# Patient Record
Sex: Female | Born: 1965
Health system: Southern US, Community
[De-identification: ages and names within clinical notes are randomized; demographics above are authoritative.]

## PROBLEM LIST (undated history)

## (undated) DIAGNOSIS — G4730 Sleep apnea, unspecified: Secondary | ICD-10-CM

## (undated) DIAGNOSIS — Z9889 Other specified postprocedural states: Secondary | ICD-10-CM

## (undated) DIAGNOSIS — F4310 Post-traumatic stress disorder, unspecified: Secondary | ICD-10-CM

## (undated) DIAGNOSIS — H8110 Benign paroxysmal vertigo, unspecified ear: Secondary | ICD-10-CM

## (undated) DIAGNOSIS — F1010 Alcohol abuse, uncomplicated: Secondary | ICD-10-CM

## (undated) DIAGNOSIS — E7800 Pure hypercholesterolemia, unspecified: Secondary | ICD-10-CM

## (undated) DIAGNOSIS — E785 Hyperlipidemia, unspecified: Secondary | ICD-10-CM

## (undated) DIAGNOSIS — F1910 Other psychoactive substance abuse, uncomplicated: Secondary | ICD-10-CM

## (undated) DIAGNOSIS — Z8742 Personal history of other diseases of the female genital tract: Secondary | ICD-10-CM

## (undated) DIAGNOSIS — F3181 Bipolar II disorder: Secondary | ICD-10-CM

## (undated) DIAGNOSIS — R112 Nausea with vomiting, unspecified: Secondary | ICD-10-CM

## (undated) DIAGNOSIS — I10 Essential (primary) hypertension: Secondary | ICD-10-CM

## (undated) DIAGNOSIS — R011 Cardiac murmur, unspecified: Secondary | ICD-10-CM

## (undated) DIAGNOSIS — T7840XA Allergy, unspecified, initial encounter: Secondary | ICD-10-CM

## (undated) DIAGNOSIS — F419 Anxiety disorder, unspecified: Secondary | ICD-10-CM

## (undated) HISTORY — DX: Sleep apnea, unspecified: G47.30

## (undated) HISTORY — DX: Personal history of other diseases of the female genital tract: Z87.42

## (undated) HISTORY — DX: Essential (primary) hypertension: I10

## (undated) HISTORY — DX: Other specified postprocedural states: Z98.890

## (undated) HISTORY — DX: Anxiety disorder, unspecified: F41.9

## (undated) HISTORY — DX: Nausea with vomiting, unspecified: R11.2

## (undated) HISTORY — DX: Other psychoactive substance abuse, uncomplicated: F19.10

## (undated) HISTORY — DX: Allergy, unspecified, initial encounter: T78.40XA

## (undated) HISTORY — DX: Cardiac murmur, unspecified: R01.1

## (undated) HISTORY — DX: Post-traumatic stress disorder, unspecified: F43.10

## (undated) HISTORY — DX: Bipolar II disorder: F31.81

## (undated) HISTORY — DX: Benign paroxysmal vertigo, unspecified ear: H81.10

## (undated) HISTORY — DX: Hyperlipidemia, unspecified: E78.5

## (undated) HISTORY — DX: Alcohol abuse, uncomplicated: F10.10

## (undated) HISTORY — DX: Pure hypercholesterolemia, unspecified: E78.00

---

## 1998-10-09 ENCOUNTER — Emergency Department (HOSPITAL_COMMUNITY): Admission: EM | Admit: 1998-10-09 | Discharge: 1998-10-09 | Payer: Self-pay | Admitting: Emergency Medicine

## 1999-03-25 ENCOUNTER — Other Ambulatory Visit: Admission: RE | Admit: 1999-03-25 | Discharge: 1999-03-25 | Payer: Self-pay | Admitting: Internal Medicine

## 1999-09-13 ENCOUNTER — Encounter: Admission: RE | Admit: 1999-09-13 | Discharge: 1999-09-13 | Payer: Self-pay | Admitting: Sports Medicine

## 1999-11-29 ENCOUNTER — Encounter: Admission: RE | Admit: 1999-11-29 | Discharge: 1999-11-29 | Payer: Self-pay | Admitting: Family Medicine

## 2000-01-12 ENCOUNTER — Encounter: Admission: RE | Admit: 2000-01-12 | Discharge: 2000-01-12 | Payer: Self-pay | Admitting: Family Medicine

## 2000-01-12 ENCOUNTER — Encounter: Admission: RE | Admit: 2000-01-12 | Discharge: 2000-01-12 | Payer: Self-pay | Admitting: Sports Medicine

## 2000-01-12 ENCOUNTER — Encounter: Payer: Self-pay | Admitting: Sports Medicine

## 2000-06-13 ENCOUNTER — Other Ambulatory Visit: Admission: RE | Admit: 2000-06-13 | Discharge: 2000-06-13 | Payer: Self-pay | Admitting: Internal Medicine

## 2000-06-16 ENCOUNTER — Encounter: Admission: RE | Admit: 2000-06-16 | Discharge: 2000-06-16 | Payer: Self-pay | Admitting: Sports Medicine

## 2000-07-03 ENCOUNTER — Encounter: Admission: RE | Admit: 2000-07-03 | Discharge: 2000-07-03 | Payer: Self-pay | Admitting: Sports Medicine

## 2000-09-13 ENCOUNTER — Encounter: Admission: RE | Admit: 2000-09-13 | Discharge: 2000-09-13 | Payer: Self-pay | Admitting: Sports Medicine

## 2000-10-02 ENCOUNTER — Encounter: Admission: RE | Admit: 2000-10-02 | Discharge: 2000-10-02 | Payer: Self-pay | Admitting: Sports Medicine

## 2000-12-25 ENCOUNTER — Encounter: Admission: RE | Admit: 2000-12-25 | Discharge: 2000-12-25 | Payer: Self-pay | Admitting: Sports Medicine

## 2001-01-24 ENCOUNTER — Encounter: Admission: RE | Admit: 2001-01-24 | Discharge: 2001-01-24 | Payer: Self-pay | Admitting: Family Medicine

## 2001-03-28 ENCOUNTER — Encounter: Admission: RE | Admit: 2001-03-28 | Discharge: 2001-03-28 | Payer: Self-pay | Admitting: Family Medicine

## 2001-04-13 ENCOUNTER — Encounter: Admission: RE | Admit: 2001-04-13 | Discharge: 2001-04-13 | Payer: Self-pay | Admitting: Sports Medicine

## 2001-05-15 ENCOUNTER — Encounter: Admission: RE | Admit: 2001-05-15 | Discharge: 2001-05-15 | Payer: Self-pay | Admitting: Family Medicine

## 2001-10-09 ENCOUNTER — Encounter: Admission: RE | Admit: 2001-10-09 | Discharge: 2001-10-09 | Payer: Self-pay | Admitting: Family Medicine

## 2001-10-24 ENCOUNTER — Encounter: Admission: RE | Admit: 2001-10-24 | Discharge: 2001-10-24 | Payer: Self-pay | Admitting: Family Medicine

## 2001-11-15 ENCOUNTER — Encounter: Admission: RE | Admit: 2001-11-15 | Discharge: 2001-11-15 | Payer: Self-pay | Admitting: Family Medicine

## 2001-12-05 ENCOUNTER — Encounter: Admission: RE | Admit: 2001-12-05 | Discharge: 2001-12-05 | Payer: Self-pay | Admitting: Family Medicine

## 2002-01-02 ENCOUNTER — Encounter: Admission: RE | Admit: 2002-01-02 | Discharge: 2002-01-02 | Payer: Self-pay | Admitting: Family Medicine

## 2002-01-30 ENCOUNTER — Encounter: Admission: RE | Admit: 2002-01-30 | Discharge: 2002-01-30 | Payer: Self-pay | Admitting: Family Medicine

## 2002-01-31 ENCOUNTER — Ambulatory Visit (HOSPITAL_COMMUNITY): Admission: RE | Admit: 2002-01-31 | Discharge: 2002-01-31 | Payer: Self-pay | Admitting: Family Medicine

## 2002-03-06 ENCOUNTER — Encounter: Admission: RE | Admit: 2002-03-06 | Discharge: 2002-03-06 | Payer: Self-pay | Admitting: Family Medicine

## 2002-03-27 ENCOUNTER — Encounter: Admission: RE | Admit: 2002-03-27 | Discharge: 2002-03-27 | Payer: Self-pay | Admitting: Family Medicine

## 2002-03-28 ENCOUNTER — Encounter: Admission: RE | Admit: 2002-03-28 | Discharge: 2002-03-28 | Payer: Self-pay | Admitting: Family Medicine

## 2002-04-24 ENCOUNTER — Encounter: Admission: RE | Admit: 2002-04-24 | Discharge: 2002-04-24 | Payer: Self-pay | Admitting: Family Medicine

## 2002-05-02 ENCOUNTER — Ambulatory Visit (HOSPITAL_COMMUNITY): Admission: RE | Admit: 2002-05-02 | Discharge: 2002-05-02 | Payer: Self-pay | Admitting: Family Medicine

## 2002-05-08 ENCOUNTER — Encounter: Admission: RE | Admit: 2002-05-08 | Discharge: 2002-05-08 | Payer: Self-pay | Admitting: Family Medicine

## 2002-05-22 ENCOUNTER — Encounter: Admission: RE | Admit: 2002-05-22 | Discharge: 2002-05-22 | Payer: Self-pay | Admitting: Family Medicine

## 2002-05-31 ENCOUNTER — Encounter: Admission: RE | Admit: 2002-05-31 | Discharge: 2002-05-31 | Payer: Self-pay | Admitting: Family Medicine

## 2002-06-01 ENCOUNTER — Encounter (INDEPENDENT_AMBULATORY_CARE_PROVIDER_SITE_OTHER): Payer: Self-pay

## 2002-06-01 ENCOUNTER — Encounter: Payer: Self-pay | Admitting: *Deleted

## 2002-06-01 ENCOUNTER — Inpatient Hospital Stay (HOSPITAL_COMMUNITY): Admission: AD | Admit: 2002-06-01 | Discharge: 2002-06-09 | Payer: Self-pay | Admitting: *Deleted

## 2002-06-20 ENCOUNTER — Encounter: Admission: RE | Admit: 2002-06-20 | Discharge: 2002-06-20 | Payer: Self-pay | Admitting: Family Medicine

## 2002-07-11 ENCOUNTER — Encounter: Admission: RE | Admit: 2002-07-11 | Discharge: 2002-08-10 | Payer: Self-pay | Admitting: Family Medicine

## 2002-07-17 ENCOUNTER — Encounter: Admission: RE | Admit: 2002-07-17 | Discharge: 2002-07-17 | Payer: Self-pay | Admitting: Family Medicine

## 2002-08-09 ENCOUNTER — Encounter: Admission: RE | Admit: 2002-08-09 | Discharge: 2002-08-09 | Payer: Self-pay | Admitting: Sports Medicine

## 2002-10-09 ENCOUNTER — Encounter: Admission: RE | Admit: 2002-10-09 | Discharge: 2002-10-09 | Payer: Self-pay | Admitting: Family Medicine

## 2002-11-04 ENCOUNTER — Encounter: Admission: RE | Admit: 2002-11-04 | Discharge: 2002-11-04 | Payer: Self-pay | Admitting: Family Medicine

## 2002-11-07 ENCOUNTER — Emergency Department (HOSPITAL_COMMUNITY): Admission: EM | Admit: 2002-11-07 | Discharge: 2002-11-07 | Payer: Self-pay | Admitting: Emergency Medicine

## 2003-02-19 ENCOUNTER — Encounter: Admission: RE | Admit: 2003-02-19 | Discharge: 2003-02-19 | Payer: Self-pay | Admitting: Family Medicine

## 2003-02-26 ENCOUNTER — Encounter: Admission: RE | Admit: 2003-02-26 | Discharge: 2003-02-26 | Payer: Self-pay | Admitting: Sports Medicine

## 2003-02-26 ENCOUNTER — Inpatient Hospital Stay (HOSPITAL_COMMUNITY): Admission: EM | Admit: 2003-02-26 | Discharge: 2003-03-03 | Payer: Self-pay | Admitting: Psychiatry

## 2003-04-02 ENCOUNTER — Encounter: Admission: RE | Admit: 2003-04-02 | Discharge: 2003-04-02 | Payer: Self-pay | Admitting: Family Medicine

## 2003-06-30 ENCOUNTER — Encounter: Admission: RE | Admit: 2003-06-30 | Discharge: 2003-06-30 | Payer: Self-pay | Admitting: Family Medicine

## 2003-08-19 ENCOUNTER — Encounter: Admission: RE | Admit: 2003-08-19 | Discharge: 2003-08-19 | Payer: Self-pay | Admitting: Family Medicine

## 2003-08-29 ENCOUNTER — Encounter: Admission: RE | Admit: 2003-08-29 | Discharge: 2003-08-29 | Payer: Self-pay | Admitting: Sports Medicine

## 2003-09-16 ENCOUNTER — Encounter: Admission: RE | Admit: 2003-09-16 | Discharge: 2003-09-16 | Payer: Self-pay | Admitting: Family Medicine

## 2003-10-01 ENCOUNTER — Encounter: Admission: RE | Admit: 2003-10-01 | Discharge: 2003-10-01 | Payer: Self-pay | Admitting: Family Medicine

## 2003-12-10 ENCOUNTER — Encounter: Admission: RE | Admit: 2003-12-10 | Discharge: 2003-12-10 | Payer: Self-pay | Admitting: Family Medicine

## 2004-02-18 ENCOUNTER — Encounter: Admission: RE | Admit: 2004-02-18 | Discharge: 2004-02-18 | Payer: Self-pay | Admitting: Family Medicine

## 2004-03-18 ENCOUNTER — Encounter: Admission: RE | Admit: 2004-03-18 | Discharge: 2004-03-18 | Payer: Self-pay | Admitting: Family Medicine

## 2004-03-29 ENCOUNTER — Encounter: Admission: RE | Admit: 2004-03-29 | Discharge: 2004-03-29 | Payer: Self-pay | Admitting: Family Medicine

## 2004-07-09 ENCOUNTER — Encounter: Admission: RE | Admit: 2004-07-09 | Discharge: 2004-07-09 | Payer: Self-pay | Admitting: Family Medicine

## 2004-07-15 ENCOUNTER — Ambulatory Visit: Payer: Self-pay | Admitting: Sports Medicine

## 2004-08-04 ENCOUNTER — Ambulatory Visit: Payer: Self-pay | Admitting: Family Medicine

## 2004-08-05 ENCOUNTER — Ambulatory Visit: Payer: Self-pay | Admitting: Family Medicine

## 2004-08-26 ENCOUNTER — Ambulatory Visit: Payer: Self-pay | Admitting: Family Medicine

## 2004-08-29 ENCOUNTER — Emergency Department (HOSPITAL_COMMUNITY): Admission: EM | Admit: 2004-08-29 | Discharge: 2004-08-29 | Payer: Self-pay | Admitting: Internal Medicine

## 2004-10-12 ENCOUNTER — Ambulatory Visit: Payer: Self-pay | Admitting: Sports Medicine

## 2004-11-05 ENCOUNTER — Ambulatory Visit: Payer: Self-pay | Admitting: Sports Medicine

## 2004-12-20 ENCOUNTER — Ambulatory Visit: Payer: Self-pay | Admitting: Family Medicine

## 2005-01-10 ENCOUNTER — Ambulatory Visit: Payer: Self-pay

## 2005-01-17 ENCOUNTER — Ambulatory Visit: Payer: Self-pay | Admitting: Family Medicine

## 2005-01-31 ENCOUNTER — Ambulatory Visit: Payer: Self-pay | Admitting: Family Medicine

## 2005-03-30 ENCOUNTER — Ambulatory Visit: Payer: Self-pay | Admitting: Family Medicine

## 2005-03-31 ENCOUNTER — Encounter: Admission: RE | Admit: 2005-03-31 | Discharge: 2005-03-31 | Payer: Self-pay | Admitting: Family Medicine

## 2005-08-14 ENCOUNTER — Encounter (INDEPENDENT_AMBULATORY_CARE_PROVIDER_SITE_OTHER): Payer: Self-pay | Admitting: *Deleted

## 2005-08-24 ENCOUNTER — Ambulatory Visit: Payer: Self-pay | Admitting: Family Medicine

## 2005-08-24 ENCOUNTER — Encounter (INDEPENDENT_AMBULATORY_CARE_PROVIDER_SITE_OTHER): Payer: Self-pay | Admitting: Family Medicine

## 2005-09-28 ENCOUNTER — Ambulatory Visit: Payer: Self-pay | Admitting: Family Medicine

## 2006-01-06 ENCOUNTER — Ambulatory Visit: Payer: Self-pay | Admitting: Sports Medicine

## 2006-01-26 ENCOUNTER — Ambulatory Visit: Payer: Self-pay | Admitting: Family Medicine

## 2006-02-09 ENCOUNTER — Ambulatory Visit: Payer: Self-pay | Admitting: Family Medicine

## 2006-04-24 ENCOUNTER — Ambulatory Visit: Payer: Self-pay | Admitting: Family Medicine

## 2006-04-27 ENCOUNTER — Ambulatory Visit: Payer: Self-pay | Admitting: Family Medicine

## 2006-05-26 ENCOUNTER — Ambulatory Visit: Payer: Self-pay | Admitting: Family Medicine

## 2006-10-09 ENCOUNTER — Ambulatory Visit: Payer: Self-pay | Admitting: Family Medicine

## 2007-01-10 ENCOUNTER — Ambulatory Visit: Payer: Self-pay | Admitting: Family Medicine

## 2007-01-12 ENCOUNTER — Encounter (INDEPENDENT_AMBULATORY_CARE_PROVIDER_SITE_OTHER): Payer: Self-pay | Admitting: *Deleted

## 2007-11-30 ENCOUNTER — Encounter: Admission: RE | Admit: 2007-11-30 | Discharge: 2007-11-30 | Payer: Self-pay | Admitting: Otolaryngology

## 2008-02-04 ENCOUNTER — Encounter: Admission: RE | Admit: 2008-02-04 | Discharge: 2008-02-04 | Payer: Self-pay | Admitting: Family Medicine

## 2008-02-29 ENCOUNTER — Encounter: Admission: RE | Admit: 2008-02-29 | Discharge: 2008-02-29 | Payer: Self-pay | Admitting: Family Medicine

## 2008-07-21 ENCOUNTER — Emergency Department (HOSPITAL_COMMUNITY): Admission: EM | Admit: 2008-07-21 | Discharge: 2008-07-21 | Payer: Self-pay | Admitting: Emergency Medicine

## 2008-12-10 ENCOUNTER — Ambulatory Visit (HOSPITAL_COMMUNITY): Admission: EM | Admit: 2008-12-10 | Discharge: 2008-12-11 | Payer: Self-pay | Admitting: Family Medicine

## 2008-12-10 ENCOUNTER — Encounter (INDEPENDENT_AMBULATORY_CARE_PROVIDER_SITE_OTHER): Payer: Self-pay | Admitting: General Surgery

## 2010-11-14 HISTORY — PX: APPENDECTOMY: SHX54

## 2010-12-05 ENCOUNTER — Encounter: Payer: Self-pay | Admitting: Family Medicine

## 2011-02-28 LAB — URINALYSIS, ROUTINE W REFLEX MICROSCOPIC
Bilirubin Urine: NEGATIVE
Glucose, UA: NEGATIVE mg/dL
Hgb urine dipstick: NEGATIVE
Ketones, ur: NEGATIVE mg/dL
Nitrite: NEGATIVE
Protein, ur: NEGATIVE mg/dL
Specific Gravity, Urine: 1.026 (ref 1.005–1.030)
Urobilinogen, UA: 0.2 mg/dL (ref 0.0–1.0)
pH: 7 (ref 5.0–8.0)

## 2011-02-28 LAB — CBC
HCT: 41.1 % (ref 36.0–46.0)
Hemoglobin: 13.9 g/dL (ref 12.0–15.0)
MCHC: 33.7 g/dL (ref 30.0–36.0)
MCV: 90.7 fL (ref 78.0–100.0)
Platelets: 210 10*3/uL (ref 150–400)
RBC: 4.53 MIL/uL (ref 3.87–5.11)
RDW: 13.5 % (ref 11.5–15.5)
WBC: 18.1 10*3/uL — ABNORMAL HIGH (ref 4.0–10.5)

## 2011-02-28 LAB — BASIC METABOLIC PANEL WITH GFR
Calcium: 9 mg/dL (ref 8.4–10.5)
GFR calc Af Amer: >60 mL/min (ref >60)
GFR calc non Af Amer: >60 mL/min (ref >60)
Sodium: 132 meq/L — ABNORMAL LOW (ref 135–145)

## 2011-02-28 LAB — BASIC METABOLIC PANEL
BUN: 6 mg/dL (ref 6–23)
CO2: 24 mEq/L (ref 19–32)
Chloride: 99 mEq/L (ref 96–112)
Creatinine, Ser: 0.66 mg/dL (ref 0.4–1.2)
Glucose, Bld: 106 mg/dL — ABNORMAL HIGH (ref 70–99)
Potassium: 3.6 mEq/L (ref 3.5–5.1)

## 2011-02-28 LAB — DIFFERENTIAL
Basophils Absolute: 0 10*3/uL (ref 0.0–0.1)
Basophils Relative: 0 % (ref 0–1)
Eosinophils Absolute: 0.1 10*3/uL (ref 0.0–0.7)
Eosinophils Relative: 0 % (ref 0–5)
Lymphocytes Relative: 6 % — ABNORMAL LOW (ref 12–46)
Lymphs Abs: 1.1 K/uL (ref 0.7–4.0)
Monocytes Absolute: 1.3 K/uL — ABNORMAL HIGH (ref 0.1–1.0)
Monocytes Relative: 7 % (ref 3–12)
Neutro Abs: 15.7 K/uL — ABNORMAL HIGH (ref 1.7–7.7)
Neutrophils Relative %: 87 % — ABNORMAL HIGH (ref 43–77)

## 2011-02-28 LAB — SAMPLE TO BLOOD BANK

## 2011-02-28 LAB — PREGNANCY, URINE: Preg Test, Ur: NEGATIVE

## 2011-03-29 NOTE — Op Note (Signed)
Nancy Bradshaw, Nancy Bradshaw             ACCOUNT NO.:  [id number]   MEDICAL RECORD NO.:  [id number]          PATIENT TYPE:  INP   LOCATION:  1528                         FACILITY:  [facility]   PHYSICIAN:  Sharlet Salina T. Hoxworth, M.D.DATE OF BIRTH:  [birth year]   DATE OF PROCEDURE:  12/11/2008  DATE OF DISCHARGE:                               OPERATIVE REPORT   PREOPERATIVE DIAGNOSIS:  Acute appendicitis.   POSTOPERATIVE DIAGNOSIS:  Acute appendicitis.   SURGICAL PROCEDURE:  Laparoscopic appendectomy.   SURGEON:  Lorne Skeens. Hoxworth, M.D.   ANESTHESIA:  General.   BRIEF HISTORY:  Nancy Bradshaw is a 45 year old female who presents  with 18 hours of severe right lower quadrant abdominal pain.  CT scan  has confirmed acute appendicitis.  I have recommended proceeding with  laparoscopic appendectomy.  The nature of the procedure, its  indications, risks of bleeding, infection, anesthetic risks, possible  need for open procedure were discussed and understood.  She is now  brought to the operating room for this procedure.   DESCRIPTION OF OPERATION:  The patient was brought to the operating room  and placed in supine position on the operating table and general  orotracheal anesthesia was induced.  The abdomen was widely sterilely  prepped and draped.  Foley catheter was in place.  She received  preoperative IV antibiotics.  Correct patient and procedure were  verified.  Local anesthesia was used to infiltrate the trocar sites.  A  1 cm incision was made at the umbilicus and dissection carried down to  midline fascia which was sharply incised for 1 cm and the peritoneum  entered under direct vision.  Through a mattress suture of 0 Vicryl, the  Hasson trocar was placed and pneumoperitoneum established.  Under direct  vision a 5 mm trocar was placed in the right upper quadrant and a 12 mm  trocar in the left lower quadrant.  The appendix was covered with  omentum which was stripped away from  inflammatory adhesions.  The  appendix was severely acutely inflamed with exudate but no evidence of  gangrene or perforation.  Some inflammatory adhesions to the terminal  ileum and pelvic sidewall were bluntly taken down and the appendix  elevated and the mesoappendix and base appendix exposed.  Some lateral  peritoneal attachments were divided further mobilizing the appendix and  cecum.  The mesoappendix was then sequentially divided with the harmonic  scalpel until the appendix was completely freed down to the base which  appeared relatively uninflamed.  The appendix was then divided off the  tip of the cecum with a single firing of the Endo GIA 45 mm stapler.  The staple line was intact and without bleeding.  The appendix was  placed in EndoCatch bag and brought out through the umbilical incision.  The right lower quadrant, pelvis, paracolic gutter, subdiaphragmatic and  subhepatic spaces were all thoroughly irrigated.  There was some  greenish cloudy fluid here but again no evidence of perforation of the  appendix.  Hemostasis was assured.  There is no evidence of trocar  injury.  Trocars were removed and all CO2 evacuated  and the mattress  suture secured at the umbilicus.  Skin incisions were closed with  subcuticular Monocryl and Dermabond.  Sponge, needle and instruments  counts correct.  She is taken to recovery in good condition.      Lorne Skeens. Hoxworth, M.D.  Electronically Signed     BTH/MEDQ  D:  12/11/2008  T:  12/11/2008  Job:  [id number]

## 2011-03-29 NOTE — H&P (Signed)
Nancy, Bradshaw             ACCOUNT NO.:  [id number]   MEDICAL RECORD NO.:  [id number]          PATIENT TYPE:  INP   LOCATION:  1528                         FACILITY:  [facility]   PHYSICIAN:  Sharlet Salina T. Hoxworth, M.D.DATE OF BIRTH:  1966/08/11   DATE OF ADMISSION:  12/10/2008  DATE OF DISCHARGE:                              HISTORY & PHYSICAL   CHIEF COMPLAINT:  Right lower quadrant pain.   HISTORY OF PRESENT ILLNESS:  Nancy Bradshaw is 45 year old female who awoke  early this morning, now about 18 hours ago, with right lower quadrant  constant aching pain.  This has been gradually worsening.  She has been  very nauseated without vomiting.  Has felt feverish.  Pain is worse with  any motion.  No urinary symptoms.  She states she has had 2 previous  similar episodes that have resolved without treatment, one requiring  evaluation in the emergency room.   PAST MEDICAL HISTORY:  Only surgeries are remote hymenectomy.  Medical  issues, followed for asthma, seasonal allergies, and bipolar disorder.   MEDICATIONS:  1. Geodon 20 mg daily.  2. Paxil 37.5 mg daily.  3. Lamictal 100 mg daily.  4. Singulair 10 mg daily.  5. Claritin 10 mg daily.  6. Asmanex 2 puffs daily.  7. Trazodone 50 mg p.r.n.  8. Nasacort daily.  9. Albuterol p.r.n.   ALLERGIES:  PENICILLIN, SULFA, SUDAFED, ZOLOFT, CODEINE, AND STEROIDS.   REVIEW OF SYSTEMS:  GENERAL:  Positive general for some weakness and  dizziness with this illness.  RESPIRATORY:  No recent shortness of  breath, cough, or wheezing.  CARDIAC:  No chest pain, palpitations,  history of heart disease.  ABDOMEN:  GI as above.  GU:  No urinary  burning or frequency.   PHYSICAL EXAMINATION:  VITAL SIGNS:  Temperature 98.2, pulse 92,  respirations 22, and blood pressure 152/66.  GENERAL:  Mildly overweight white female who appears in pain.  SKIN:  Warm and dry.  No rash or infection.  HEENT:  No palpable mass or thyromegaly.  Sclerae  nonicteric.  Oropharynx clear.  LYMPH NODES:  No cervical, supraclavicular, or inguinal nodes palpable.  LUNGS:  Clear.  No wheezing.  No increased work of breathing.  CARDIAC:  Regular.  Mild tachycardia.  Soft systolic murmur.  Peripheral  pulses are intact.  No JVD or edema.  ABDOMEN:  Well-localized right lower quadrant with tenderness and  guarding localized.  No discernable masses or organomegaly.  EXTREMITIES:  No joint swelling, deformity, or edema.  NEUROLOGIC:  Alert, fully oriented.  Motor and sensory examination is  grossly normal.   LABORATORY:  White count elevated at 18,000 and hemoglobin 13.9.  Urinalysis negative.   CT scan of the abdomen and pelvis reviewed.  This shows a dilated fluid-  filled appendix with some periappendiceal stranding consistent with  acute appendicitis.   ASSESSMENT/PLAN:  Acute appendicitis.  The patient is receiving broad-  spectrum antibiotics.  She will be taken to the operating room for  emergency laparoscopic appendectomy.      Lorne Skeens. Hoxworth, M.D.  Electronically Signed  BTH/MEDQ  D:  12/11/2008  T:  12/11/2008  Job:  [id number]

## 2011-04-01 NOTE — Discharge Summary (Signed)
NAME:  Nancy Bradshaw, Nancy Bradshaw                       ACCOUNT NO.:  [id number]   MEDICAL RECORD NO.:  [id number]                   PATIENT TYPE:  IPS   LOCATION:  0502                                 FACILITY:  [facility]   PHYSICIAN:  Jeanice Lim, M.D.              DATE OF BIRTH:  November 06, 1966   DATE OF ADMISSION:  02/26/2003  DATE OF DISCHARGE:  03/03/2003                                 DISCHARGE SUMMARY   IDENTIFYING DATA:  This is a 45 year old Caucasian female, married,  voluntarily admitted.  Presented to the emergency room after 2-3 weeks with  increasing agitation, anxiety with visualizing thoughts of hanging herself  or waking up in the bathtub covered with blood, having suicidal thoughts  with no acute intent but fearful of these visions, overwhelmed,  breastfeeding, parenting and sole breadwinner for family.  Described a long  history of constant thinking, worrying, somewhat obsessive thinking but no  depressive symptoms until just prior to admission.  She had been tried on  Zoloft, which caused a severe increased anxiety, and is fearful of this kind  of medication.   MEDICATIONS:  Oral contraceptives.   ALLERGIES:  PENICILLIN, SULFA.   PHYSICAL EXAMINATION:  Essentially within normal limits.  Neurologically  nonfocal.   LABORATORY DATA:  Routine admission labs essentially within normal limits.   MENTAL STATUS EXAM:  Anxious, tearful female.  Panicky, cooperative.  Flushed face and neck.  Depressed and anxious.  Intrusive thoughts about  suicide with no active plan.  Mild thought agitation and panic with no  paranoid ideation or hallucinations.  Obsessive ruminating worry.  Fearful  of medications.  Cognition intact.  Judgment and insight fair.   ADMISSION DIAGNOSES:   AXIS I:  1. Major depression, severe, single episode.  2. Generalized anxiety disorder.   AXIS II:  None.   AXIS III:  Lactating female.   AXIS IV:  Moderate to severe (stress related to parenting and  job stress).   AXIS V:  26/75.   HOSPITAL COURSE:  The patient was admitted and ordered routine p.r.n.  medications and underwent further monitoring.  Was encouraged to participate  in individual, group and milieu therapy.  Was monitored for safety and  medication plan was discussed in detail due to patient's concerns.  Had  previous reaction to Zoloft.  The patient was initially given Klonopin and  then Ativan p.r.n. to control acute severe anxiety and Vistaril p.r.n.  anxiety which she had a positive response to.  Trazodone was started to  restore sleep.  The patient had one on one session with a counselor and  Paxil CR was added for severe anxiety, depressive symptoms, ruminating  thoughts at a low dose to minimize side effects.  The patient reported a  positive tolerance to medication and some improvement due to sleeping well  and motivation to follow up with Erskine Squibb __________ and a psychiatrist and be  compliant with medications.  CONDITION ON DISCHARGE:  Markedly improved.  Mood was less depressed, less  severely anxious.  No agitation.  Thought processes goal directed.  Thought  content negative for dangerous ideation.  There were no suicidal thoughts or  visions.  The patient reported motivation to be compliant with follow-up  plan and show the healthier coping mechanisms to deal with stress as well as  set limits.   DISCHARGE MEDICATIONS:  1. Paxil CR 12.5 mg q.8h. p.r.n.  2. Trazodone 50 mg, 1-1/2 at 8 p.m.  3. Ativan 0.5 mg, 1/2 at 8 p.m. and q.6h. p.r.n. anxiety.  4. Vistaril 25 mg q.6h. p.r.n. anxiety.   FOLLOW UP:  The patient was to follow up with Erskine Squibb __________ on March 12, 2003 and Dr. Kathrynn Running March 11, 2003 for medication follow-up and  psychotherapy.   DISCHARGE DIAGNOSES:   AXIS I:  1. Major depression, severe, single episode.  2. Generalized anxiety disorder.   AXIS II:  None.   AXIS III:  Lactating female.   AXIS IV:  Moderate to severe (stress  related to parenting and job stress).   AXIS V:  Global Assessment of Functioning on discharge 55.                                               Jeanice Lim, M.D.    JEM/MEDQ  D:  03/26/2003  T:  03/27/2003  Job:  [id number]

## 2011-04-01 NOTE — Discharge Summary (Signed)
   Nancy Bradshaw, Nancy Bradshaw                       ACCOUNT NO.:  [id number]   MEDICAL RECORD NO.:  [id number]                   PATIENT TYPE:  INP   LOCATION:  9125                                 FACILITY:  [facility]   PHYSICIAN:  Maylon Peppers. Waynette Buttery, M.D.               DATE OF BIRTH:  1966-10-21   DATE OF ADMISSION:  06/01/2002  DATE OF DISCHARGE:  06/09/2002                                 DISCHARGE SUMMARY   DIAGNOSIS AT DISCHARGE:  Postpartum day #3 with pre-eclampsia.   DISCHARGE INSTRUCTIONS:  The patient was discharged on the following  medications:  ibuprofen 600 mg p.o. q.6h. p.r.n. pain and Micronor as  directed.  She was discharged with appointment for her baby to see Dr. Mardelle Matte  on Wednesday, July 30 at 8:55 a.m., and she is instructed to return to see  Dr. Mardelle Matte in six weeks for postpartum visit.   LABORATORY AND ACCESSORY DATA:  Labs at discharge - WBC is 18.2, hemoglobin  10.1, hematocrit 28.8, platelet count 196,000.  RPR is nonreactive.   HISTORY OF PRESENT ILLNESS:  The patient is a 45 year old female who was  initially admitted at 35 weeks and 6 days for management of pre-eclampsia.  She denied headaches, visual disturbances, or right upper quadrant pain, but  on admission had a blood pressure of 148/82, trace edema to her feet and  ankles.  She had a uric acid of 7.4, creatinine of 1.1, AST 26, ALT 19.  LDH  was 151.  Platelet count was 243,000.   HOSPITAL COURSE:  She was admitted and monitored.  Her 24 hour urine protein  was 127 on July 22nd and were some concerning decelerations on fetal  monitoring.  A decision was made to induce the patient.  She was given  Cytotec and Pitocin induction.  Cytotec was repeated on the 23rd, and on the  24th of July the patient delivered a female vaginally.  The placenta did not  deliver for greater than 40 minutes, and the patient had increased vaginal  bleeding.  Therefore, manual extraction of the placenta was performed, and  the  placenta was sent to pathology.  Throughout this, the patient was on  magnesium sulfate for pre-eclampsia.  The magnesium was discontinued on July  26th, and on July 27th after being stable off the magnesium the patient was  discharged home.                                                Maylon Peppers Waynette Buttery, M.D.    SAG/MEDQ  D:  06/09/2002  T:  06/16/2002  Job:  [id number]   cc:   Viviann Spare A. Waynette Buttery, M.D.   Camille L. Mardelle Matte, M.D.
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
## 2011-08-17 LAB — COMPREHENSIVE METABOLIC PANEL
CO2: 26
Calcium: 9.2
Creatinine, Ser: 0.76
GFR calc non Af Amer: >60
Glucose, Bld: 109 — ABNORMAL HIGH
Total Protein: 6.7

## 2011-08-17 LAB — DIFFERENTIAL
Lymphocytes Relative: 11 — ABNORMAL LOW
Lymphs Abs: 1.3
Monocytes Relative: 3
Neutro Abs: 10.4 — ABNORMAL HIGH
Neutrophils Relative %: 86 — ABNORMAL HIGH

## 2011-08-17 LAB — CBC
Hemoglobin: 13.3
MCHC: 34.2
MCV: 91
RBC: 4.26
RDW: 13.9

## 2011-08-17 LAB — POCT I-STAT, CHEM 8
BUN: 11
Creatinine, Ser: 1.1
Glucose, Bld: 110 — ABNORMAL HIGH
Hemoglobin: 13.9
Sodium: 136
TCO2: 27

## 2011-08-17 LAB — URINALYSIS, ROUTINE W REFLEX MICROSCOPIC
Bilirubin Urine: NEGATIVE
Glucose, UA: NEGATIVE
Hgb urine dipstick: NEGATIVE
Protein, ur: NEGATIVE
Urobilinogen, UA: 0.2

## 2012-06-26 ENCOUNTER — Other Ambulatory Visit: Payer: Self-pay | Admitting: Radiology

## 2012-06-26 MED ORDER — LISINOPRIL 10 MG PO TABS: 10.0000 mg | ORAL_TABLET | Freq: Every day | ORAL | Status: DC

## 2012-07-19 ENCOUNTER — Ambulatory Visit: Payer: Self-pay | Admitting: Family Medicine

## 2012-07-20 ENCOUNTER — Ambulatory Visit: Payer: Self-pay | Admitting: Family Medicine

## 2012-07-20 VITALS — BP 119/73 | HR 74 | Temp 97.6°F | Resp 16 | Ht 65.0 in | Wt 166.0 lb

## 2012-07-20 DIAGNOSIS — Z8742 Personal history of other diseases of the female genital tract: Secondary | ICD-10-CM

## 2012-07-20 DIAGNOSIS — Z0289 Encounter for other administrative examinations: Secondary | ICD-10-CM

## 2012-07-20 DIAGNOSIS — F1021 Alcohol dependence, in remission: Secondary | ICD-10-CM

## 2012-07-20 DIAGNOSIS — I10 Essential (primary) hypertension: Secondary | ICD-10-CM | POA: Insufficient documentation

## 2012-07-20 HISTORY — DX: Personal history of other diseases of the female genital tract: Z87.42

## 2012-07-20 MED ORDER — LEVONORGEST-ETH ESTRAD 91-DAY 0.15-0.03 MG PO TABS: 1.0000 | ORAL_TABLET | Freq: Every day | ORAL | Status: DC

## 2012-07-20 MED ORDER — LISINOPRIL 10 MG PO TABS: 10.0000 mg | ORAL_TABLET | Freq: Every day | ORAL | Status: DC

## 2012-07-20 NOTE — Patient Instructions (Signed)
I have not placed PPD today because your risk of contracting TB is very low, you have no symptoms and there is currently a shortage of the product used for testing.

## 2012-07-22 ENCOUNTER — Encounter: Payer: Self-pay | Admitting: Family Medicine

## 2012-07-22 NOTE — Progress Notes (Signed)
S: This 46 y.o. 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 She volunteers that she is in recovery x 5 years and does not want narcotics prescribed unless necessary. She is doing quite well and staying busy with a few other projects. She has HTN, well controlled on current medication; she denies any side effects (HA,SOb, cough, CP or tightness, palpitations, dizziness or lightheadedness, weakness or syncope). Also requests refill for Seasonale which she takes for menstrual irregularity and dysmenorrhea. Psychiatric care and medications are provided by a Specialist. Pt reports that she tries to maintain good physical fitness and used to run marathons but had to give this up because of joint pain.  ROS: As per HPI; otherwise, noncontributory.  O: Filed Vitals:   07/20/12 1035  BP: 119/73  Pulse: 74  Temp: 97.6 F (36.4 C)  Resp: 16   GEN: In NAD; WN,WD. HENT: [place]/AT; EOMI, conj/scl clear. COR: RRR; no m,g,r. LUNGS: Normal resp rate and effort. CTA. NEURO: A&O x 3; CNs intact; no deficits, nonfocal.  A/P; 1. HTN (hypertension) -stable; RF Lisinopril 10 mg  1 tab daily  #90   3 RFs  2. Hx of dysmenorrhea  RF: Seasonale x 1 year; pt to [facility] in 6 months for exam  3. Personal history of alcoholism

## 2012-10-06 ENCOUNTER — Ambulatory Visit: Payer: Self-pay | Admitting: Family Medicine

## 2012-10-06 VITALS — BP 126/80 | HR 71 | Temp 97.9°F | Resp 16 | Ht 64.5 in | Wt 174.6 lb

## 2012-10-06 DIAGNOSIS — H6010 Cellulitis of external ear, unspecified ear: Secondary | ICD-10-CM

## 2012-10-06 DIAGNOSIS — H60399 Other infective otitis externa, unspecified ear: Secondary | ICD-10-CM

## 2012-10-06 DIAGNOSIS — L0291 Cutaneous abscess, unspecified: Secondary | ICD-10-CM

## 2012-10-06 MED ORDER — DOXYCYCLINE HYCLATE 100 MG PO TABS: 100.0000 mg | ORAL_TABLET | Freq: Two times a day (BID) | ORAL | Status: DC

## 2012-10-06 NOTE — Progress Notes (Signed)
 [facility]:  Office Visit  Chief Complaint:  Chief Complaint  Patient presents with  . Cyst    behind L ear x 3-4 dys    HPI: Nancy Bradshaw is a 46 y.o. female who complains of  The following: Left sore behind ear x 3-4 days, has had this before. Is tired of flare ups of "cysts", needed other areas to be removed before they would stop flaring up.  Has had some warmth, is extremley painful even when not touching it. Has tried warm compresses without relief. History of cyst  Past Medical History  Diagnosis Date  . Hypertension   . Substance abuse     No alcohol since 2008   No past surgical history on file. History   Social History  . Marital Status: Married    Spouse Name: N/A    Number of Children: N/A  . Years of Education: N/A   Social History Main Topics  . Smoking status: Never Smoker   . Smokeless tobacco: Not on file  . Alcohol Use: No     Comment: In Recovery  x 5 yrs  . Drug Use: No  . Sexually Active: Not Currently   Other Topics Concern  . Not on file   Social History Narrative  . No narrative on file   No family history on file. Allergies  Allergen Reactions  . Azithromycin   . Codeine   . Penicillins   . Sudafed (Pseudoephedrine Hcl)   . Sulfa Antibiotics   . Zoloft (Sertraline Hcl)    Prior to Admission medications   Medication Sig Start Date End Date Taking? Authorizing Provider  lamoTRIgine (LAMICTAL) 200 MG tablet Take 300 mg by mouth daily.   Yes Historical Provider, MD  levonorgestrel-ethinyl estradiol (SEASONALE,INTROVALE,JOLESSA) 0.15-0.03 MG tablet Take 1 tablet by mouth daily. 07/20/12  Yes Maurice March, MD  lisinopril (PRINIVIL,ZESTRIL) 10 MG tablet Take 1 tablet (10 mg total) by mouth daily. 07/20/12 07/20/13 Yes Maurice March, MD  mirtazapine (REMERON SOL-TAB) 45 MG disintegrating tablet Take 45 mg by mouth at bedtime.   Yes Historical Provider, MD  Multiple Vitamin (MULTIVITAMIN) tablet Take 1 tablet  by mouth daily.   Yes Historical Provider, MD  ziprasidone (GEODON) 80 MG capsule Take 80 mg by mouth daily.    Yes Historical Provider, MD     ROS: The patient denies fevers, chills, night sweats, unintentional weight loss, chest pain, palpitations, wheezing, dyspnea on exertion, nausea, vomiting, abdominal pain, dysuria, hematuria, melena, numbness, weakness, or tingling.   All other systems have been reviewed and were otherwise negative with the exception of those mentioned in the HPI and as above.    PHYSICAL EXAM: Filed Vitals:   10/06/12 1634  BP: 126/80  Pulse: 71  Temp: 97.9 F (36.6 C)  Resp: 16   Filed Vitals:   10/06/12 1634  Height: 5' 4.5" (1.638 m)  Weight: 174 lb 9.6 oz (79.198 kg)   Body mass index is 29.51 kg/(m^2).  General: Alert, no acute distress HEENT:  Normocephalic, atraumatic, oropharynx patent. TM nl.  Cardiovascular:  Regular rate and rhythm, no rubs murmurs or gallops.  No Carotid bruits, radial pulse intact. No pedal edema.  Respiratory: Clear to auscultation bilaterally.  No wheezes, rales, or rhonchi.  No cyanosis, no use of accessory musculature GI: No organomegaly, abdomen is soft and non-tender, positive bowel sounds.  No masses. Skin: + left small nodule, slightly inflammed 10 mm. Warm, minimal erythema, tender to  the touch Neurologic: Facial musculature symmetric. Psychiatric: Patient is appropriate throughout our interaction. Lymphatic: No cervical lymphadenopathy Musculoskeletal: Gait intact.   LABS:    EKG/XRAY:   Primary read interpreted by Dr. Conley Rolls at [facility].   ASSESSMENT/PLAN: Encounter Diagnosis  Name Primary?  Marland Kitchen Abscess Yes   The area the patient is concerned about is in the early stages of infection, possible abscess. I have asked Mrs. McClung to look at it and see if she is comfortable doing an I and D of this but she feels that it is not warranted, which I fully agree. The patient wanted a second opinion.  We have agreed  that it is best we try conservative management with warm compresses and Doxycycline 100 mg BID x 10 Days since area is small, the risks outweigh the benefits. She additionally does not have insurance and the procedure will be expensive for her. F/u prn    ,  PHUONG, DO 10/06/2012 5:13 PM

## 2013-04-17 ENCOUNTER — Encounter: Payer: Self-pay | Admitting: Family Medicine

## 2013-04-17 ENCOUNTER — Ambulatory Visit (INDEPENDENT_AMBULATORY_CARE_PROVIDER_SITE_OTHER): Payer: BC Managed Care – PPO | Admitting: Family Medicine

## 2013-04-17 VITALS — BP 111/77 | HR 70 | Temp 98.4°F | Resp 16 | Ht 65.5 in | Wt 176.0 lb

## 2013-04-17 DIAGNOSIS — I10 Essential (primary) hypertension: Secondary | ICD-10-CM

## 2013-04-17 DIAGNOSIS — Z760 Encounter for issue of repeat prescription: Secondary | ICD-10-CM

## 2013-04-17 DIAGNOSIS — Z3041 Encounter for surveillance of contraceptive pills: Secondary | ICD-10-CM

## 2013-04-17 MED ORDER — LEVONORGEST-ETH ESTRAD 91-DAY 0.15-0.03 MG PO TABS: 1.0000 | ORAL_TABLET | Freq: Every day | ORAL | Status: DC

## 2013-04-17 MED ORDER — LISINOPRIL 10 MG PO TABS: 10.0000 mg | ORAL_TABLET | Freq: Every day | ORAL | Status: DC

## 2013-04-17 NOTE — Patient Instructions (Signed)
I have phoned in the refill for your OCP (generic as requested). If you have any problems with the refill, pleases contact the office. I will see you in August for you CPE/PAP.

## 2013-04-17 NOTE — Progress Notes (Signed)
S: This 47 y.o. Cauc female is here for medication refills; she now has insurance and needs to have OCP refilled with a specific generic as indicated in the packaging (levonorgestrel- ethinyl estradiol 0.15-0.03 mg tab). She has no problems with this OCP- no abnormal weight gain, diaphoresis, HA, dizziness, leg pain or swelling, CP or tightness, SOB or cough, abdominal pain or paresthesias. She is not sexually active but does practice safe sex. Pt also needs refill for Lisinopril which she takes for HTN (stable and well controlled).  Patient Active Problem List   Diagnosis Date Noted  . Personal history of alcoholism 07/20/2012  . Hx of dysmenorrhea 07/20/2012  . HTN (hypertension) 07/20/2012    PMHx, Soc Hx and Fam Hx reviewed.  OCeasar Mons Vitals:   04/17/13 0822  BP: 111/77  Pulse: 70  Temp: 98.4 F (36.9 C)  Resp: 16   GEN: In NAD; WN,WD. HENT: [place]/AT; EOMI w/ clear conj/sclerae. Otherwise unremarkable. COR: RRR. LUNGS: Normal resp rate and effort. SKIN: W&D; no rashes or pallor. NEURO: A&O x 3; CNs intact. Nonfocal.  A/P: Uses oral contraception- current product is well tolerated by pt and she is content to remain on this OCP.  HTN, goal below 140/80- stable.  Issue of repeat prescriptions  Meds ordered this encounter  Medications  . DISCONTD: lisinopril (PRINIVIL,ZESTRIL) 10 MG tablet    Sig: Take 1 tablet (10 mg total) by mouth daily.    Dispense:  90 tablet    Refill:  1  . lisinopril (PRINIVIL,ZESTRIL) 10 MG tablet    Sig: Take 1 tablet (10 mg total) by mouth daily.    Dispense:  90 tablet    Refill:  3  . levonorgestrel-ethinyl estradiol (SEASONALE,INTROVALE,JOLESSA) 0.15-0.03 MG tablet    Sig: Take 1 tablet by mouth daily.    Dispense:  3 Package    Refill:  3  OCP phoned to pharmacy to insure that pt receives requested generic. RTC in August 2014 for CPE/PAP.

## 2013-06-21 ENCOUNTER — Encounter: Payer: Self-pay | Admitting: Family Medicine

## 2013-06-21 ENCOUNTER — Ambulatory Visit (INDEPENDENT_AMBULATORY_CARE_PROVIDER_SITE_OTHER): Payer: BC Managed Care – PPO | Admitting: Family Medicine

## 2013-06-21 VITALS — BP 125/78 | HR 79 | Temp 98.3°F | Resp 16 | Ht 65.0 in | Wt 176.0 lb

## 2013-06-21 DIAGNOSIS — Z124 Encounter for screening for malignant neoplasm of cervix: Secondary | ICD-10-CM

## 2013-06-21 DIAGNOSIS — Z3041 Encounter for surveillance of contraceptive pills: Secondary | ICD-10-CM | POA: Insufficient documentation

## 2013-06-21 DIAGNOSIS — F319 Bipolar disorder, unspecified: Secondary | ICD-10-CM | POA: Insufficient documentation

## 2013-06-21 DIAGNOSIS — Z0000 Encounter for general adult medical examination without abnormal findings: Secondary | ICD-10-CM

## 2013-06-21 DIAGNOSIS — Z01419 Encounter for gynecological examination (general) (routine) without abnormal findings: Secondary | ICD-10-CM

## 2013-06-21 DIAGNOSIS — Z130 Encounter for screening for diseases of the blood and blood-forming organs and certain disorders involving the immune mechanism: Secondary | ICD-10-CM

## 2013-06-21 DIAGNOSIS — Z1231 Encounter for screening mammogram for malignant neoplasm of breast: Secondary | ICD-10-CM

## 2013-06-21 DIAGNOSIS — Z13220 Encounter for screening for lipoid disorders: Secondary | ICD-10-CM

## 2013-06-21 DIAGNOSIS — I10 Essential (primary) hypertension: Secondary | ICD-10-CM

## 2013-06-21 LAB — COMPREHENSIVE METABOLIC PANEL
Albumin: 4.4 g/dL (ref 3.5–5.2)
Alkaline Phosphatase: 38 U/L — ABNORMAL LOW (ref 39–117)
BUN: 11 mg/dL (ref 6–23)
CO2: 24 mEq/L (ref 19–32)
Glucose, Bld: 84 mg/dL (ref 70–99)
Potassium: 4.4 mEq/L (ref 3.5–5.3)
Sodium: 138 mEq/L (ref 135–145)
Total Bilirubin: 0.5 mg/dL (ref 0.3–1.2)
Total Protein: 6.6 g/dL (ref 6.0–8.3)

## 2013-06-21 LAB — CBC WITH DIFFERENTIAL/PLATELET
Hemoglobin: 13.5 g/dL (ref 12.0–15.0)
Lymphocytes Relative: 26 % (ref 12–46)
Lymphs Abs: 1.7 10*3/uL (ref 0.7–4.0)
Monocytes Relative: 5 % (ref 3–12)
Neutrophils Relative %: 65 % (ref 43–77)
Platelets: 296 10*3/uL (ref 150–400)
RBC: 4.39 MIL/uL (ref 3.87–5.11)
WBC: 6.6 10*3/uL (ref 4.0–10.5)

## 2013-06-21 LAB — LIPID PANEL
Cholesterol: 217 mg/dL — ABNORMAL HIGH (ref 0–200)
HDL: 56 mg/dL (ref >39)
Total CHOL/HDL Ratio: 3.9 Ratio
Triglycerides: 105 mg/dL (ref <150)

## 2013-06-21 LAB — POCT URINALYSIS DIPSTICK
Ketones, UA: NEGATIVE
Protein, UA: NEGATIVE
Spec Grav, UA: 1.015
Urobilinogen, UA: 0.2

## 2013-06-21 NOTE — Patient Instructions (Signed)
Keeping You Healthy  Get These Tests 1. Blood Pressure- Have your blood pressure checked once a year by your health care provider.  Normal blood pressure is 120/80. 2. Weight- Have your body mass index (BMI) calculated to screen for obesity.  BMI is measure of body fat based on height and weight.  You can also calculate your own BMI at https://www.west-esparza.com/. 3. Cholesterol- Have your cholesterol checked every 5 years starting at age 45 then yearly starting at age 80. 4. Chlamydia, HIV, and other sexually transmitted diseases- Get screened every year until age 59, then within three months of each new sexual provider. 5. Pap Smear- Every 1-3 years; discuss with your health care provider. 6. Mammogram- Every year starting at age 42  Take these medicines  Calcium with Vitamin D-Your body needs 1200 mg of Calcium each day and [phone] IU of Vitamin D daily.  Your body can only absorb 500 mg of Calcium at a time so Calcium must be taken in 2 or 3 divided doses throughout the day.  Multivitamin with folic acid- Once daily if it is possible for you to become pregnant.  Get these Immunizations  Menactra-Single dose; prevents meningitis.  Tetanus shot- Every 10 years. Next vaccine due in 2019.  Flu shot-Every year.  Take these steps 1. Do not smoke-Your healthcare provider can help you quit.  For tips on how to quit go to www.smokefree.gov or call 1-800 QUITNOW. 2. Be physically active- Exercise 5 days a week for at least 30 minutes.  If you are not already physically active, start slow and gradually work up to 30 minutes of moderate physical activity.  Examples of moderate activity include walking briskly, dancing, swimming, bicycling, etc. 3. Breast Cancer- A self breast exam every month is important for early detection of breast cancer.  For more information and instruction on self breast exams, ask your healthcare provider or [place]. 4. Eat a healthy  diet- Eat a variety of healthy foods such as fruits, vegetables, whole grains, low fat milk, low fat cheeses, yogurt, lean meats, poultry and fish, beans, nuts, tofu, etc.  For more information go to www. Thenutritionsource.org 5. Drink alcohol in moderation- Limit alcohol intake to one drink or less per day. Never drink and drive. 6. Depression- Your emotional health is as important as your physical health.  If you're feeling down or losing interest in things you normally enjoy please talk to your healthcare provider about being screened for depression. 7. Dental visit- Brush and floss your teeth twice daily; visit your dentist twice a year. 8. Eye doctor- Get an eye exam at least every 2 years. 9. Helmet use- Always wear a helmet when riding a bicycle, motorcycle, rollerblading or skateboarding. 10. Safe sex- If you may be exposed to sexually transmitted infections, use a condom. 11. Seat belts- Seat belts can save your live; always wear one. 12. Smoke/Carbon Monoxide detectors- These detectors need to be installed on the appropriate level of your home. Replace batteries at least once a year. 13. Skin cancer- When out in the sun please cover up and use sunscreen 15 SPF or higher. 14. Violence- If anyone is threatening or hurting you, please tell your healthcare provider.

## 2013-06-21 NOTE — Progress Notes (Signed)
Subjective:    Patient ID: Nancy Bradshaw, female    DOB: 1966-09-21, 47 y.o.   MRN: [id number]  HPI This 47 y.o. Cauc female is here for CPE/PAP. Last pelvic w/ PAP > 3 years ago due to lack  of insurance. Pt has no complaints and is not sexually active > 4 years. Bipolar Disorder is  treated by [facility]. 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  Patient Active Problem List   Diagnosis Date Noted  . Bipolar disorder, unspecified 06/21/2013  . Personal history of alcoholism 07/20/2012  . Hx of dysmenorrhea 07/20/2012  . HTN (hypertension) 07/20/2012    PMHx, Soc Hx and Fam Hx reviewed.  Medications reconciled.   Review of Systems  Constitutional: Negative.   HENT: Negative.   Eyes: Negative.   Respiratory: Negative.   Cardiovascular: Negative.   Gastrointestinal: Negative.   Endocrine: Negative.   Genitourinary: Negative.   Musculoskeletal: Negative.   Skin: Negative.   Allergic/Immunologic: Negative.   Neurological: Negative.   Hematological: Negative.   Psychiatric/Behavioral: Negative.        Objective:   Physical Exam  Nursing note and vitals reviewed. Constitutional: She is oriented to person, place, and time. Vital signs are normal. She appears well-developed and well-nourished. No distress.  HENT:  Head: Normocephalic and atraumatic.  Right Ear: Hearing, tympanic membrane, external ear and ear canal normal.  Left Ear: Hearing, tympanic membrane, external ear and ear canal normal.  Nose: Nose normal. No nasal deformity or septal deviation.  Mouth/Throat: Uvula is midline, oropharynx is clear and moist and mucous membranes are normal. No oral lesions. Normal dentition. No dental caries.  Eyes: Conjunctivae, EOM and lids are normal. Pupils are equal, round, and reactive to light. No scleral icterus.  Fundoscopic exam:      The right eye shows no arteriolar narrowing, no AV nicking and no papilledema.  The right eye shows red reflex.       The left eye shows no arteriolar narrowing and no papilledema. The left eye shows red reflex.  Neck: Normal range of motion and full passive range of motion without pain. Neck supple. No spinous process tenderness and no muscular tenderness present. Normal range of motion present. No mass and no thyromegaly present.  Cardiovascular: Normal rate, regular rhythm, S1 normal, S2 normal, normal heart sounds and normal pulses.  PMI is not displaced.  Exam reveals no gallop.   No murmur heard. Pulmonary/Chest: Effort normal. No apnea. No respiratory distress. Right breast exhibits no inverted nipple, no mass, no nipple discharge, no skin change and no tenderness. Left breast exhibits no inverted nipple, no mass, no nipple discharge, no skin change and no tenderness. Breasts are symmetrical.  Abdominal: Soft. Normal appearance and bowel sounds are normal. She exhibits no distension, no pulsatile midline mass and no mass. There is no hepatosplenomegaly. There is no tenderness. There is no guarding and no CVA tenderness. No hernia. Hernia confirmed negative in the right inguinal area and confirmed negative in the left inguinal area.  Genitourinary: Rectum normal, vagina normal and uterus normal. There is no rash, tenderness or lesion on the right labia. There is no rash, tenderness or lesion on the left labia. Uterus is not deviated, not enlarged and not tender. Cervix exhibits no motion tenderness, no discharge and no friability. Right adnexum displays no mass, no tenderness and no fullness. Left adnexum displays no mass, no tenderness and no fullness. No erythema,  tenderness or bleeding around the vagina. No foreign body around the vagina. No signs of injury around the vagina. No vaginal discharge found.  Musculoskeletal: Normal range of motion. She exhibits no edema and no tenderness.  Lymphadenopathy:       Right: No inguinal adenopathy present.       Left: No inguinal  adenopathy present.  Neurological: She is alert and oriented to person, place, and time. She has normal reflexes. No cranial nerve deficit. She exhibits normal muscle tone. Coordination normal.  Skin: Skin is warm and dry. No rash noted. No erythema. No pallor.  Psychiatric: She has a normal mood and affect. Her behavior is normal. Judgment and thought content normal.       Assessment & Plan:  Routine general medical examination at a health care facility - Plan: POCT urinalysis dipstick, Comprehensive metabolic panel, Vitamin D, 25-hydroxy  Encounter for cervical Pap smear with pelvic exam - Plan: Pap IG w/ reflex to HPV when ASC-U  HTN (hypertension) - Stable and controlled on current medications.   Plan: TSH, T4, Free  Screening for other and unspecified deficiency anemia - Plan: CBC with Differential  Screening for hyperlipidemia - Plan: Lipid panel  Other screening mammogram - Plan: MM Digital Screening

## 2013-06-22 LAB — VITAMIN D 25 HYDROXY (VIT D DEFICIENCY, FRACTURES): Vit D, 25-Hydroxy: 99 ng/mL — ABNORMAL HIGH (ref 30–89)

## 2013-06-24 LAB — PAP IG W/ RFLX HPV ASCU

## 2013-06-25 ENCOUNTER — Encounter: Payer: Self-pay | Admitting: Family Medicine

## 2013-06-25 NOTE — Progress Notes (Signed)
Quick Note:  Please contact pt and advise that the following labs are abnormal...  Vitamin D level is above normal ; if you are taking a supplement, reduce the dose.  Lipid panel shows Total and LDL ("bad") cholesterol levels are above normal. Try to improve your nutrition and exercise for 30 minutes most days of the week. These measures will help lower your total and LDL cholesterol values.  All other lab results are normal. PAP is negative (normal).   Copy to pt. ______

## 2013-07-29 ENCOUNTER — Encounter: Payer: Self-pay | Admitting: Family Medicine

## 2013-08-25 ENCOUNTER — Ambulatory Visit (INDEPENDENT_AMBULATORY_CARE_PROVIDER_SITE_OTHER): Payer: BC Managed Care – PPO | Admitting: Emergency Medicine

## 2013-08-25 VITALS — BP 112/80 | HR 76 | Temp 98.6°F | Resp 16 | Ht 64.75 in | Wt 176.0 lb

## 2013-08-25 DIAGNOSIS — Z789 Other specified health status: Secondary | ICD-10-CM

## 2013-08-25 NOTE — Progress Notes (Addendum)
[facility] [address] [name] [address] [phone]  Date:  08/25/2013   Name:  Nancy Bradshaw   DOB:  Jun 10, 1966   MRN:  [id number]  PCP:  Dow Adolph, MD    Chief Complaint: Immunizations   History of Present Illness:  Nancy Bradshaw is a 47 y.o. very pleasant female patient who presents with the following:  For preemployment varicella titres.  Patient Active Problem List   Diagnosis Date Noted  . Bipolar disorder, unspecified 06/21/2013  . Uses oral contraception 06/21/2013  . Personal history of alcoholism 07/20/2012  . Hx of dysmenorrhea 07/20/2012  . HTN (hypertension) 07/20/2012    Past Medical History  Diagnosis Date  . Hypertension   . Substance abuse     No alcohol since 2008  . Allergy   . Heart murmur   . Alcohol abuse   . Hyperlipidemia   . Anxiety     Past Surgical History  Procedure Laterality Date  . Appendectomy      History  Substance Use Topics  . Smoking status: Never Smoker   . Smokeless tobacco: Not on file  . Alcohol Use: No     Comment: In Recovery  x 5 yrs    Family History  Problem Relation Age of Onset  . Hypertension Father   . Hyperlipidemia Father   . Hypertension Maternal Grandmother   . Hypertension Maternal Grandfather   . Bipolar disorder Paternal Grandmother   . Hypertension Paternal Grandmother   . Hyperlipidemia Paternal Grandmother   . Heart disease Paternal Grandmother   . ADD / ADHD Daughter     Allergies  Allergen Reactions  . Azithromycin   . Codeine   . Penicillins   . Sudafed [Pseudoephedrine Hcl]   . Sulfa Antibiotics   . Zoloft [Sertraline Hcl]     Medication list has been reviewed and updated.  Current Outpatient Prescriptions on File Prior to Visit  Medication Sig Dispense Refill  . lamoTRIgine (LAMICTAL) 200 MG tablet Take 300 mg by mouth daily.      Marland Kitchen levonorgestrel-ethinyl estradiol (SEASONALE,INTROVALE,JOLESSA) 0.15-0.03 MG tablet Take 1  tablet by mouth daily.  3 Package  3  . lisinopril (PRINIVIL,ZESTRIL) 10 MG tablet Take 1 tablet (10 mg total) by mouth daily.  90 tablet  3  . mirtazapine (REMERON SOL-TAB) 45 MG disintegrating tablet Take 45 mg by mouth at bedtime.      . Multiple Vitamin (MULTIVITAMIN) tablet Take 1 tablet by mouth daily.      . ziprasidone (GEODON) 80 MG capsule Take 80 mg by mouth daily.        No current facility-administered medications on file prior to visit.    Review of Systems:  As per HPI, otherwise negative.    Physical Examination: Filed Vitals:   08/25/13 1250  BP: 112/80  Pulse: 76  Temp: 98.6 F (37 C)  Resp: 16   Filed Vitals:   08/25/13 1250  Height: 5' 4.75" (1.645 m)  Weight: 176 lb (79.833 kg)   Body mass index is 29.5 kg/(m^2). Ideal Body Weight: Weight in (lb) to have BMI = 25: 148.8   GEN: WDWN, NAD, Non-toxic, Alert & Oriented x 3 HEENT: Atraumatic, Normocephalic.  Ears and Nose: No external deformity. EXTR: No clubbing/cyanosis/edema NEURO: Normal gait.  PSYCH: Normally interactive. Conversant. Not depressed or anxious appearing.  Calm demeanor.    Assessment and Plan: Varicella titer Please mail results to patient   Signed,  Phillips Odor,  MD

## 2013-08-25 NOTE — Addendum Note (Signed)
Addended by: Thelma Barge D on: 08/25/2013 01:11 PM   Modules accepted: Orders

## 2013-08-27 LAB — VARICELLA ZOSTER ANTIBODY, IGG: Varicella IgG: 3493 Index — ABNORMAL HIGH (ref <135.00)

## 2013-09-19 ENCOUNTER — Other Ambulatory Visit: Payer: Self-pay

## 2013-11-05 ENCOUNTER — Ambulatory Visit (INDEPENDENT_AMBULATORY_CARE_PROVIDER_SITE_OTHER): Payer: BC Managed Care – PPO | Admitting: Family Medicine

## 2013-11-05 VITALS — BP 110/80 | HR 74 | Temp 98.0°F | Resp 16 | Ht 65.75 in | Wt 181.4 lb

## 2013-11-05 DIAGNOSIS — Z3041 Encounter for surveillance of contraceptive pills: Secondary | ICD-10-CM

## 2013-11-05 DIAGNOSIS — G4730 Sleep apnea, unspecified: Secondary | ICD-10-CM

## 2013-11-05 DIAGNOSIS — N926 Irregular menstruation, unspecified: Secondary | ICD-10-CM

## 2013-11-08 ENCOUNTER — Encounter: Payer: Self-pay | Admitting: Family Medicine

## 2013-11-08 NOTE — Progress Notes (Signed)
S:  This 47 y.o. Cauc female has menstrual irregularity which is controlled on OCPs. She wants to discontinue this medication but does not want to risk pregnancy. She would like to discuss options, such as endometrial ablation. She has discussed GYN specialists w/ a friend and agrees that she needs to discuss this with a GYN physician. She has had problems in past with heavy bleeding w/ clots as well as painful menses.  Pt has been diagnosed w/ mild sleep apnea and has seen a dental specialist who fits pts w/ oral appliance to reduce apnea symptoms. Pt has a letter from the dentist that requires my signature (letter will enable pt to get insurer to cover cost of this treatment).  Pt has concerns about BP; current medication dose seems to be effective but she thinks BP could be better. We discussed role of OCPs in HTN; pt also thinks psychiatric medications could be contributing to this also. She states she will accept risk of elevated BP w/ these medications; it is important to her to maintain stability w/ her Bipolar disorder. She plans to remain on these medications because they are very effective.  Patient Active Problem List   Diagnosis Date Noted  . Bipolar disorder, unspecified 06/21/2013  . Uses oral contraception 06/21/2013  . Personal history of alcoholism 07/20/2012  . Hx of dysmenorrhea 07/20/2012  . HTN (hypertension) 07/20/2012   PMHx, Surg Hx, Soc and Fam Hx reviewed.  Medications reconciled.  ROS: As per HPI.  O: Filed Vitals:   11/05/13 1640  BP: 110/80  Pulse: 74  Temp: 98 F (36.7 C)  Resp: 16   GEN: In NAD: WN,WD. HENT: [place]/AT; EOMI w/ clear conj/sclerae. Otherwise unremarkable. COR: RRR. LUNGS: Unlabored resp. SKIN: W&D; intact w/o diaphoresis, erythema or pallor. NEURO: A&O x 3; CNs intact. Nonfocal.  A/P; Abnormal menses - Plan: Ambulatory referral to Gynecology  Mild sleep apnea- Letter from Dental specialist signed; to be scanned into EPIC.  Uses oral  contraception- Continue current OCPs until consultation w/ GYN.

## 2013-11-22 ENCOUNTER — Telehealth: Payer: Self-pay

## 2013-11-22 DIAGNOSIS — N926 Irregular menstruation, unspecified: Secondary | ICD-10-CM

## 2013-11-22 NOTE — Telephone Encounter (Signed)
It looks like dr Leward Quan had put in referral for this patient but should not have routed it anywhere but did. [facility] and [facility] through [facility] because we put it in to be routed there. Could we please put in referral for [facility] for this patient as stated in the notes from the doctor

## 2013-11-22 NOTE — Telephone Encounter (Signed)
Put this in for her.

## 2013-12-20 ENCOUNTER — Encounter: Payer: Self-pay | Admitting: Obstetrics & Gynecology

## 2013-12-20 ENCOUNTER — Other Ambulatory Visit: Payer: Self-pay | Admitting: Family Medicine

## 2013-12-27 ENCOUNTER — Encounter: Payer: Self-pay | Admitting: Family Medicine

## 2013-12-27 ENCOUNTER — Ambulatory Visit (INDEPENDENT_AMBULATORY_CARE_PROVIDER_SITE_OTHER): Payer: BC Managed Care – PPO | Admitting: Family Medicine

## 2013-12-27 VITALS — BP 121/71 | HR 82 | Temp 98.4°F | Resp 16 | Ht 65.0 in | Wt 181.0 lb

## 2013-12-27 DIAGNOSIS — I10 Essential (primary) hypertension: Secondary | ICD-10-CM

## 2013-12-27 MED ORDER — LISINOPRIL 5 MG PO TABS: ORAL_TABLET | ORAL | Status: DC

## 2013-12-27 MED ORDER — BLOOD PRESSURE MONITOR/L CUFF MISC: 1.0000 | Freq: Every day | Status: DC

## 2013-12-27 NOTE — Progress Notes (Signed)
S:  This 48 y.o. Cauc female is here for HTN follow-up; she has seen GYN for evaluation for endometrial ablation. Pt also interested in [place] so that she will not have to continue taking OCPs. Pt thinks BP will come down to normal once she discontinues OCPs. The Gynecologist is researching surgical options. She would like to have surgery before May 2015.  Weight loss is not a primary concern as she is working full time and is in a Designer, jewellery for Social Work. She tries to maintain healthy nutrition but drinks diet soda twice a week. She walks on the weekends.   Patient Active Problem List   Diagnosis Date Noted  . Bipolar disorder, unspecified 06/21/2013  . Uses oral contraception 06/21/2013  . Personal history of alcoholism 07/20/2012  . Hx of dysmenorrhea 07/20/2012  . HTN (hypertension) 07/20/2012   PMHx, Surg Hx, Soc and Fam Hx reviewed.  MEDICATIONS reconciled.  ROS: Negative for abnormal weight gain, diaphoresis, CP or tightness, palpitations, edema, SOB or DOE, cough, HA, dizziness, lightheadedness, numbness, weakness or syncope.  O: Filed Vitals:   12/27/13 1701  BP: 121/71  Pulse: 82  Temp: 98.4 F (36.9 C)  Resp: 16   GEN: In NAD; WN,WD. HENT: [place]/AT; EOMI w/ clear conj/sclerae. Otherwise unremarkable. COR: RRR. LUNGS: Normal resp rate and effort. SKIN: W&D; intact w/o diaphoresis, erythema or pallor. NEURO: A&O x 3; CNs intact. Nonfocal.  A/P: HTN, goal below 130/80- Pt will monitor BP at home on a daily basis. Lisinopril has been changed to 5 mg  2 tablets daily (=10 mg); if BP = 120/80 or less, she will reduce  Lisinopril dose to 5 mg 1 tab daily.  Meds ordered this encounter  Medications  . lisinopril (PRINIVIL,ZESTRIL) 5 MG tablet    Sig: Take 1 or 2 tablets daily as directed.    Dispense:  60 tablet    Refill:  3  . Blood Pressure Monitoring (BLOOD PRESSURE MONITOR/L CUFF) MISC    Sig: 1 Device by Does not apply route daily.    Dispense:  1 each   Refill:  0    Provide a wrist cuff for pt.    RTC prior to surgical procedure.

## 2014-01-09 ENCOUNTER — Ambulatory Visit (INDEPENDENT_AMBULATORY_CARE_PROVIDER_SITE_OTHER): Payer: BC Managed Care – PPO | Admitting: Physician Assistant

## 2014-01-09 ENCOUNTER — Other Ambulatory Visit: Payer: Self-pay | Admitting: Physician Assistant

## 2014-01-09 VITALS — BP 138/84 | HR 108 | Temp 99.3°F | Resp 16 | Ht 64.0 in | Wt 178.2 lb

## 2014-01-09 DIAGNOSIS — R509 Fever, unspecified: Secondary | ICD-10-CM

## 2014-01-09 DIAGNOSIS — J111 Influenza due to unidentified influenza virus with other respiratory manifestations: Secondary | ICD-10-CM

## 2014-01-09 DIAGNOSIS — J101 Influenza due to other identified influenza virus with other respiratory manifestations: Secondary | ICD-10-CM

## 2014-01-09 DIAGNOSIS — J029 Acute pharyngitis, unspecified: Secondary | ICD-10-CM

## 2014-01-09 LAB — POCT CBC
GRANULOCYTE PERCENT: 72.7 % (ref 37–80)
HCT, POC: 45 % (ref 37.7–47.9)
Hemoglobin: 14 g/dL (ref 12.2–16.2)
Lymph, poc: 1.2 (ref 0.6–3.4)
MCH: 30.2 pg (ref 27–31.2)
MCHC: 31.1 g/dL — AB (ref 31.8–35.4)
MCV: 97.2 fL — AB (ref 80–97)
MID (CBC): 0.5 (ref 0–0.9)
MPV: 9.6 fL (ref 0–99.8)
PLATELET COUNT, POC: 246 10*3/uL (ref 142–424)
POC Granulocyte: 4.7 (ref 2–6.9)
POC LYMPH %: 19.2 % (ref 10–50)
POC MID %: 8.1 % (ref 0–12)
RBC: 4.63 M/uL (ref 4.04–5.48)
RDW, POC: 13.6 %
WBC: 6.4 10*3/uL (ref 4.6–10.2)

## 2014-01-09 LAB — POCT INFLUENZA A/B
INFLUENZA B, POC: NEGATIVE
Influenza A, POC: POSITIVE

## 2014-01-09 LAB — POCT RAPID STREP A (OFFICE): RAPID STREP A SCREEN: NEGATIVE

## 2014-01-09 MED ORDER — IPRATROPIUM BROMIDE 0.03 % NA SOLN: 2.0000 | Freq: Two times a day (BID) | NASAL | Status: DC

## 2014-01-09 MED ORDER — OSELTAMIVIR PHOSPHATE 75 MG PO CAPS: 75.0000 mg | ORAL_CAPSULE | Freq: Two times a day (BID) | ORAL | Status: DC

## 2014-01-09 MED ORDER — BENZONATATE 100 MG PO CAPS: 100.0000 mg | ORAL_CAPSULE | Freq: Three times a day (TID) | ORAL | Status: DC | PRN

## 2014-01-09 NOTE — Patient Instructions (Signed)
Begin taking the Tamiflu as soon as you pick it up today - try to get two doses in today.  Finish the full course as directed  Tylenol and/or Advil to relieve fever, aches  Atrovent nasal spray 2-3 times per day to help relieve congestion and post-nasal drainage  Tessalon Perles every 8 hours as needed for cough  Plenty of fluid (water is best!) and rest  Out of work until fever free for 24 hours without Tylenol or Advil  Please let us know if any symptoms are worsening or not improving  Influenza, Adult Influenza ("the flu") is a viral infection of the respiratory tract. It occurs more often in winter months because people spend more time in close contact with one another. Influenza can make you feel very sick. Influenza easily spreads from person to person (contagious). CAUSES  Influenza is caused by a virus that infects the respiratory tract. You can catch the virus by breathing in droplets from an infected person's cough or sneeze. You can also catch the virus by touching something that was recently contaminated with the virus and then touching your mouth, nose, or eyes. SYMPTOMS  Symptoms typically last 4 to 10 days and may include:  Fever.  Chills.  Headache, body aches, and muscle aches.  Sore throat.  Chest discomfort and cough.  Poor appetite.  Weakness or feeling tired.  Dizziness.  Nausea or vomiting. DIAGNOSIS  Diagnosis of influenza is often made based on your history and a physical exam. A nose or throat swab test can be done to confirm the diagnosis. RISKS AND COMPLICATIONS You may be at risk for a more severe case of influenza if you smoke cigarettes, have diabetes, have chronic heart disease (such as heart failure) or lung disease (such as asthma), or if you have a weakened immune system. Elderly people and pregnant women are also at risk for more serious infections. The most common complication of influenza is a lung infection (pneumonia). Sometimes, this  complication can require emergency medical care and may be life-threatening. PREVENTION  An annual influenza vaccination (flu shot) is the best way to avoid getting influenza. An annual flu shot is now routinely recommended for all adults in the [place] TREATMENT  In mild cases, influenza goes away on its own. Treatment is directed at relieving symptoms. For more severe cases, your caregiver may prescribe antiviral medicines to shorten the sickness. Antibiotic medicines are not effective, because the infection is caused by a virus, not by bacteria. HOME CARE INSTRUCTIONS  Only take over-the-counter or prescription medicines for pain, discomfort, or fever as directed by your caregiver.  Use a cool mist humidifier to make breathing easier.  Get plenty of rest until your temperature returns to normal. This usually takes 3 to 4 days.  Drink enough fluids to keep your urine clear or pale yellow.  Cover your mouth and nose when coughing or sneezing, and wash your hands well to avoid spreading the virus.  Stay home from work or school until your fever has been gone for at least 1 full day. SEEK MEDICAL CARE IF:   You have chest pain or a deep cough that worsens or produces more mucus.  You have nausea, vomiting, or diarrhea. SEEK IMMEDIATE MEDICAL CARE IF:   You have difficulty breathing, shortness of breath, or your skin or nails turn bluish.  You have severe neck pain or stiffness.  You have a severe headache, facial pain, or earache.  You have a worsening or recurring fever.  You have nausea or vomiting that cannot be controlled. MAKE SURE YOU:  Understand these instructions.  Will watch your condition.  Will get help right away if you are not doing well or get worse. Document Released: 10/28/2000 Document Revised: 05/01/2012 Document Reviewed: 01/30/2012 [facility] Patient Information 2014 [place], [place].

## 2014-01-09 NOTE — Progress Notes (Signed)
Subjective:    Patient ID: Nancy Bradshaw, female    DOB: Feb 20, 1966, 48 y.o.   MRN: [id number]  HPI    Nancy Bradshaw is a very pleasant 48 yr old female here with concern for illness.  Reports 2 days of sore throat, sinus pressure, ear pain, sneezing.  Sore throat is worsening.  Now with laryngitis.  She has concern for strep.  She denies body aches.  She does have a non-productive cough as well.  She denies SOB or wheezing.  Reports a history of asthma "but I don't have it anymore."  Fever tmax 100.25F.  She did take the flu shot this year.  She works at a [employer].  Has tried zinc lozenges, homeopathic cough medicine, Claritin.  She is a recovering alcoholic and does not take anything habit forming, including cough medicine.     Review of Systems  Constitutional: Positive for fever and fatigue.  HENT: Positive for congestion, ear pain, rhinorrhea, sinus pressure, sneezing and sore throat.   Respiratory: Positive for cough. Negative for shortness of breath and wheezing.   Cardiovascular: Negative.   Gastrointestinal: Negative.   Musculoskeletal: Negative.   Skin: Negative.        Objective:   Physical Exam  Vitals reviewed. Constitutional: She is oriented to person, place, and time. She appears well-developed and well-nourished. No distress.  HENT:  Head: Normocephalic and atraumatic.  Right Ear: Tympanic membrane and ear canal normal.  Left Ear: Ear canal normal. Tympanic membrane is injected.  Nose: Mucosal edema and rhinorrhea present.  Mouth/Throat: Uvula is midline, oropharynx is clear and moist and mucous membranes are normal.  Eyes: Conjunctivae are normal. No scleral icterus.  Neck: Neck supple.  Cardiovascular: Normal rate and regular rhythm.   Murmur heard. Pulmonary/Chest: Effort normal. She has no wheezes. She has no rales.  Abdominal: Soft. There is no tenderness.  Lymphadenopathy:    She has no cervical adenopathy.  Neurological: She is alert and  oriented to person, place, and time.  Skin: Skin is warm and dry.  Psychiatric: She has a normal mood and affect. Her behavior is normal.    Results for orders placed in visit on 01/09/14  POCT CBC      Result Value Ref Range   WBC 6.4  4.6 - 10.2 K/uL   Lymph, poc 1.2  0.6 - 3.4   POC LYMPH PERCENT 19.2  10 - 50 %L   MID (cbc) 0.5  0 - 0.9   POC MID % 8.1  0 - 12 %M   POC Granulocyte 4.7  2 - 6.9   Granulocyte percent 72.7  37 - 80 %G   RBC 4.63  4.04 - 5.48 M/uL   Hemoglobin 14.0  12.2 - 16.2 g/dL   HCT, POC 45.0  37.7 - 47.9 %   MCV 97.2 (*) 80 - 97 fL   MCH, POC 30.2  27 - 31.2 pg   MCHC 31.1 (*) 31.8 - 35.4 g/dL   RDW, POC 13.6     Platelet Count, POC 246  142 - 424 K/uL   MPV 9.6  0 - 99.8 fL  POCT INFLUENZA A/B      Result Value Ref Range   Influenza A, POC Positive     Influenza B, POC Negative    POCT RAPID STREP A (OFFICE)      Result Value Ref Range   Rapid Strep A Screen Negative  Negative       Assessment &  Plan:  1. Influenza A Nancy Bradshaw is a very pleasant 48 yr old female here with influenza A.  Will start Tamiflu.  Tylenol, Advil for fever relief.  Push fluids, rest.  Out of work until fever free for 24 hours without medication.  - oseltamivir (TAMIFLU) 75 MG capsule; Take 1 capsule (75 mg total) by mouth 2 (two) times daily.  Dispense: 10 capsule; Refill: 0 - benzonatate (TESSALON) 100 MG capsule; Take 1-2 capsules (100-200 mg total) by mouth 3 (three) times daily as needed for cough.  Dispense: 40 capsule; Refill: 0 - ipratropium (ATROVENT) 0.03 % nasal spray; Place 2 sprays into the nose 2 (two) times daily.  Dispense: 30 mL; Refill: 1  2. Acute pharyngitis - POCT CBC - POCT Influenza A/B - POCT rapid strep A  3. Fever, unspecified - POCT CBC - POCT Influenza A/B - POCT rapid strep A  Pt to call or RTC if worsening or not improving  E. Natividad Brood MHS, PA-C [facility] 2/26/20154:10 PM

## 2014-01-11 LAB — CULTURE, GROUP A STREP: Organism ID, Bacteria: NORMAL

## 2014-02-12 HISTORY — PX: ENDOMETRIAL ABLATION: SHX621

## 2014-02-12 HISTORY — PX: TUBAL LIGATION: SHX77

## 2014-02-21 ENCOUNTER — Other Ambulatory Visit: Payer: Self-pay | Admitting: Obstetrics and Gynecology

## 2014-03-30 ENCOUNTER — Ambulatory Visit (INDEPENDENT_AMBULATORY_CARE_PROVIDER_SITE_OTHER): Payer: BC Managed Care – PPO | Admitting: Family Medicine

## 2014-03-30 VITALS — BP 124/80 | HR 82 | Temp 98.3°F | Resp 16 | Ht 64.0 in | Wt 178.0 lb

## 2014-03-30 DIAGNOSIS — Z7189 Other specified counseling: Secondary | ICD-10-CM

## 2014-03-30 DIAGNOSIS — L709 Acne, unspecified: Secondary | ICD-10-CM

## 2014-03-30 DIAGNOSIS — Z7184 Encounter for health counseling related to travel: Secondary | ICD-10-CM

## 2014-03-30 DIAGNOSIS — L708 Other acne: Secondary | ICD-10-CM

## 2014-03-30 DIAGNOSIS — Z23 Encounter for immunization: Secondary | ICD-10-CM

## 2014-03-30 MED ORDER — TRETINOIN 0.05 % EX CREA: TOPICAL_CREAM | Freq: Every day | CUTANEOUS | Status: DC

## 2014-03-30 MED ORDER — TYPHOID VACCINE PO CPDR: 1.0000 | DELAYED_RELEASE_CAPSULE | ORAL | Status: DC

## 2014-03-30 NOTE — Patient Instructions (Signed)
Use the oral typhoid vaccine as directed.  You got your 2nd hep A shot today.  Look over the information I gave you regarding malaria in [place].  If you do decide you would like medication just let me know- you would want to start it a week prior to travel.   Have a great time!  You can adjust you retin-a cream use as tolerated

## 2014-03-30 NOTE — Progress Notes (Signed)
[facility] [address], [place] [address] [phone]  Date:  03/30/2014   Name:  Nancy Bradshaw   DOB:  [birth year]   MRN:  [id number]  PCP:  Ellsworth Lennox, MD    Chief Complaint: Immunizations   History of Present Illness:  Nancy Bradshaw is a 48 y.o. very pleasant female patient who presents with the following:  Here today for a travel consultation. She is traveling to [place] next month- she plans to visit [place] and possibly "just cross over the border;" she does not plan to travel far into [place].  She did have hep A fist shot a few years ago, never had the 2nd shot.  She would like to have this again She has noted acne since she went off OCP a couple of months ago- she notes the acne mostly on her face.  She would like to have an rx as OTC products have not helped much.   She has had the hep b series, and did take typhoid vaccine but it was a long time ago- more than 10 years.  She would also like to update her typhoid.  Tetanus is UTD  Patient Active Problem List   Diagnosis Date Noted  . Bipolar disorder, unspecified 06/21/2013  . Uses oral contraception 06/21/2013  . Personal history of alcoholism 07/20/2012  . Hx of dysmenorrhea 07/20/2012  . HTN (hypertension) 07/20/2012    Past Medical History  Diagnosis Date  . Hypertension   . Substance abuse     No alcohol since 2008  . Allergy   . Heart murmur   . Alcohol abuse   . Hyperlipidemia   . Anxiety     Past Surgical History  Procedure Laterality Date  . Appendectomy    . Tubal ligation      History  Substance Use Topics  . Smoking status: Never Smoker   . Smokeless tobacco: Not on file  . Alcohol Use: No     Comment: In Recovery  x 5 yrs    Family History  Problem Relation Age of Onset  . Hypertension Father   . Hyperlipidemia Father   . Hypertension Maternal Grandmother   . Hypertension Maternal Grandfather   . Bipolar disorder Paternal Grandmother   .  Hypertension Paternal Grandmother   . Hyperlipidemia Paternal Grandmother   . Heart disease Paternal Grandmother   . ADD / ADHD Daughter     Allergies  Allergen Reactions  . Azithromycin Nausea And Vomiting  . Codeine   . Sudafed [Pseudoephedrine Hcl]   . Zoloft [Sertraline Hcl]   . Penicillins Rash  . Sulfa Antibiotics Rash    Medication list has been reviewed and updated.  Current Outpatient Prescriptions on File Prior to Visit  Medication Sig Dispense Refill  . Blood Pressure Monitoring (BLOOD PRESSURE MONITOR/L CUFF) MISC 1 Device by Does not apply route daily.  1 each  0  . lamoTRIgine (LAMICTAL) 200 MG tablet Take 300 mg by mouth daily.      Marland Kitchen lisinopril (PRINIVIL,ZESTRIL) 5 MG tablet Take 1 or 2 tablets daily as directed.  60 tablet  3  . mirtazapine (REMERON SOL-TAB) 45 MG disintegrating tablet Take 45 mg by mouth at bedtime.      . Multiple Vitamin (MULTIVITAMIN) tablet Take 1 tablet by mouth daily.      . ziprasidone (GEODON) 80 MG capsule Take 80 mg by mouth daily.       . benzonatate (TESSALON) 100 MG capsule  Take 1-2 capsules (100-200 mg total) by mouth 3 (three) times daily as needed for cough.  40 capsule  0  . ipratropium (ATROVENT) 0.03 % nasal spray Place 2 sprays into the nose 2 (two) times daily.  30 mL  1  . levonorgestrel-ethinyl estradiol (SEASONALE,INTROVALE,JOLESSA) 0.15-0.03 MG tablet Take 1 tablet by mouth daily.  3 Package  3  . oseltamivir (TAMIFLU) 75 MG capsule Take 1 capsule (75 mg total) by mouth 2 (two) times daily.  10 capsule  0   No current facility-administered medications on file prior to visit.    Review of Systems:  As per HPI- otherwise negative.   Physical Examination: Filed Vitals:   03/30/14 1744  BP: 124/80  Pulse: 82  Temp: 98.3 F (36.8 C)  Resp: 16   Filed Vitals:   03/30/14 1744  Height: 5\' 4"  (1.626 m)  Weight: 178 lb (80.74 kg)   Body mass index is 30.54 kg/(m^2). Ideal Body Weight: Weight in (lb) to have BMI =  25: 145.3  GEN: WDWN, NAD, Non-toxic, A & O x 3. Overweight, looks well HEENT: Atraumatic, Normocephalic. Neck supple. No masses, No LAD. Mild pustular acne mostly along the jaw line Ears and Nose: No external deformity. CV: RRR, No M/G/R. No JVD. No thrill. No extra heart sounds. PULM: CTA B, no wheezes, crackles, rhonchi. No retractions. No resp. distress. No accessory muscle use. EXTR: No c/c/e NEURO Normal gait.  PSYCH: Normally interactive. Conversant. Not depressed or anxious appearing.  Calm demeanor.    Assessment and Plan: Travel advice encounter - Plan: typhoid (VIVOTIF BERNA VACCINE) DR capsule, Hepatitis A vaccine adult IM  Acne - Plan: tretinoin (RETIN-A) 0.05 % cream  Travel consultation. Gave 2nd hep A vaccine and rx for viotif.  Discussed malaria.  Her exposure will be minimal- per CDC no malaria along [place] border.  She will look further into her possible travel plans and let me know if she does desire some prophylaxis, but for now she declines.  Gave print out of CDC information regarding malaria in [place]   Trial of tretinoin cream for acne.  Cautioned regarding skin sensitivity, adjust frequency of use as needed  Signed Lamar Blinks, MD

## 2014-04-02 ENCOUNTER — Telehealth: Payer: Self-pay

## 2014-04-02 NOTE — Telephone Encounter (Signed)
PA needed for tretinoin cream. Completed form on covermymeds.

## 2014-04-10 NOTE — Telephone Encounter (Signed)
PA approved through 03/31/16. Pharmacy notified.

## 2014-04-21 ENCOUNTER — Other Ambulatory Visit: Payer: Self-pay | Admitting: Family Medicine

## 2014-07-29 ENCOUNTER — Other Ambulatory Visit: Payer: Self-pay | Admitting: Family Medicine

## 2014-08-18 ENCOUNTER — Ambulatory Visit (INDEPENDENT_AMBULATORY_CARE_PROVIDER_SITE_OTHER): Payer: BC Managed Care – PPO | Admitting: *Deleted

## 2014-08-18 DIAGNOSIS — Z23 Encounter for immunization: Secondary | ICD-10-CM

## 2014-08-26 ENCOUNTER — Other Ambulatory Visit: Payer: Self-pay | Admitting: Physician Assistant

## 2014-09-01 ENCOUNTER — Encounter: Payer: Self-pay | Admitting: Family Medicine

## 2014-09-25 ENCOUNTER — Encounter: Payer: BC Managed Care – PPO | Admitting: Family Medicine

## 2014-09-29 ENCOUNTER — Other Ambulatory Visit: Payer: Self-pay | Admitting: Family Medicine

## 2014-10-15 ENCOUNTER — Ambulatory Visit: Payer: BC Managed Care – PPO | Attending: Family Medicine | Admitting: Family Medicine

## 2014-10-15 ENCOUNTER — Encounter: Payer: Self-pay | Admitting: Family Medicine

## 2014-10-15 VITALS — BP 125/78 | HR 72 | Temp 98.1°F | Resp 18 | Ht 64.0 in | Wt 179.0 lb

## 2014-10-15 DIAGNOSIS — Z418 Encounter for other procedures for purposes other than remedying health state: Secondary | ICD-10-CM

## 2014-10-15 DIAGNOSIS — I10 Essential (primary) hypertension: Secondary | ICD-10-CM | POA: Insufficient documentation

## 2014-10-15 DIAGNOSIS — F3163 Bipolar disorder, current episode mixed, severe, without psychotic features: Secondary | ICD-10-CM

## 2014-10-15 DIAGNOSIS — E785 Hyperlipidemia, unspecified: Secondary | ICD-10-CM | POA: Diagnosis not present

## 2014-10-15 DIAGNOSIS — K1370 Unspecified lesions of oral mucosa: Secondary | ICD-10-CM | POA: Insufficient documentation

## 2014-10-15 DIAGNOSIS — E559 Vitamin D deficiency, unspecified: Secondary | ICD-10-CM | POA: Insufficient documentation

## 2014-10-15 DIAGNOSIS — Z87891 Personal history of nicotine dependence: Secondary | ICD-10-CM | POA: Diagnosis not present

## 2014-10-15 DIAGNOSIS — F4310 Post-traumatic stress disorder, unspecified: Secondary | ICD-10-CM

## 2014-10-15 DIAGNOSIS — B001 Herpesviral vesicular dermatitis: Secondary | ICD-10-CM | POA: Insufficient documentation

## 2014-10-15 DIAGNOSIS — Z8349 Family history of other endocrine, nutritional and metabolic diseases: Secondary | ICD-10-CM | POA: Diagnosis not present

## 2014-10-15 DIAGNOSIS — Z23 Encounter for immunization: Secondary | ICD-10-CM

## 2014-10-15 DIAGNOSIS — Z299 Encounter for prophylactic measures, unspecified: Secondary | ICD-10-CM

## 2014-10-15 LAB — COMPLETE METABOLIC PANEL WITH GFR
ALBUMIN: 4.3 g/dL (ref 3.5–5.2)
ALK PHOS: 64 U/L (ref 39–117)
ALT: 25 U/L (ref 0–35)
AST: 23 U/L (ref 0–37)
BUN: 11 mg/dL (ref 6–23)
CO2: 27 meq/L (ref 19–32)
Calcium: 9.6 mg/dL (ref 8.4–10.5)
Chloride: 102 mEq/L (ref 96–112)
Creat: 1 mg/dL (ref 0.50–1.10)
GFR, EST AFRICAN AMERICAN: 78 mL/min
GFR, EST NON AFRICAN AMERICAN: 68 mL/min
GLUCOSE: 82 mg/dL (ref 70–99)
POTASSIUM: 4.6 meq/L (ref 3.5–5.3)
Sodium: 138 mEq/L (ref 135–145)
Total Bilirubin: 0.5 mg/dL (ref 0.2–1.2)
Total Protein: 6.7 g/dL (ref 6.0–8.3)

## 2014-10-15 LAB — LIPID PANEL
CHOL/HDL RATIO: 5.1 ratio
CHOLESTEROL: 310 mg/dL — AB (ref 0–200)
HDL: 61 mg/dL (ref >39)
LDL Cholesterol: 225 mg/dL — ABNORMAL HIGH (ref 0–99)
Triglycerides: 118 mg/dL (ref <150)
VLDL: 24 mg/dL (ref 0–40)

## 2014-10-15 LAB — TSH: TSH: 1.392 u[IU]/mL (ref 0.350–4.500)

## 2014-10-15 MED ORDER — VALACYCLOVIR HCL 500 MG PO TABS: 500.0000 mg | ORAL_TABLET | Freq: Two times a day (BID) | ORAL | Status: DC

## 2014-10-15 NOTE — Assessment & Plan Note (Signed)
A: maternal grandmother and great aunts affected. Patient w/o signs and symptoms. P: TSH level

## 2014-10-15 NOTE — Assessment & Plan Note (Signed)
A: sore of lip with appearance of cold sore/oral herpes P: Valtrex

## 2014-10-15 NOTE — Patient Instructions (Signed)
Nancy Bradshaw,  Thank you for coming in today. It was a pleasure meeting you. I look forward to being your primary doctor.   I will call you lab results. I have sent valtrex to [facility]. I will send lisinopril refill once I have reviewed your labs.   Plan for f/u physical and HTN in 1 year.  Dr. Adrian Blackwater   Look into aqua glycolic, mild chemical pill.

## 2014-10-15 NOTE — Progress Notes (Signed)
Establish Care Annual Physical  Hx HTN

## 2014-10-15 NOTE — Assessment & Plan Note (Signed)
A: vit D deficiency in the past, last check elevated at 88 P: F/u vit D level

## 2014-10-15 NOTE — Progress Notes (Signed)
   Subjective:    Patient ID: Nancy Bradshaw, female    DOB: 05-23-1966, 48 y.o.   MRN: [id number] CC: establish care, chronic HTN, hypothyroidism in the family, vit D check HPI 48 yo F presents to establish care and discuss the following:  1. HTN: since 2009. Taking lisinopril 10 mg daily. No cough. No CP, SOB, le edema.   Soc Hx: former smoker quit  Med Hx: hx of HTN, PTSD, bipolar depression  Fam Hx: history of hypothyroidism  Review of Systems General:  Negative for unexplained weight loss, fever Skin: Positive for sore of R side of mouth x one month following using topical retin-A. Negative for new or changing mole.  HEENT: Negative for trouble hearing, trouble seeing, ringing in ears, mouth sores, hoarseness, change in voice, dysphagia. CV:  Negative for chest pain, dyspnea, edema, palpitations Resp: Negative for cough, dyspnea, hemoptysis GI: Negative for nausea, vomiting, diarrhea, constipation, abdominal pain, melena, hematochezia. GU: Negative for dysuria, incontinence, urinary hesitance, hematuria, vaginal or penile discharge, polyuria, sexual difficulty, lumps in testicle or breasts MSK: Negative for muscle cramps or aches, joint pain or swelling Neuro: Negative for headaches, weakness, numbness, dizziness, passing out/fainting Psych: Negative for depression, anxiety, memory problems      Objective:   Physical Exam BP 125/78 mmHg  Pulse 72  Temp(Src) 98.1 F (36.7 C) (Oral)  Resp 18  Ht 5\' 4"  (1.626 m)  Wt 179 lb (81.194 kg)  BMI 30.71 kg/m2  SpO2 100%  General Appearance:    Alert, cooperative, no distress, appears stated age  Head:    Normocephalic, without obvious abnormality, atraumatic  Eyes:    PERRL, conjunctiva/corneas clear, EOM's intact,   both eyes  Ears:    Normal TM's and external ear canals, both ears  Nose:   Nares normal, septum midline, mucosa normal, no drainage    or sinus tenderness  Throat:   Lips, mucosa, and tongue normal; teeth and  gums normal  Neck:   Supple, symmetrical, trachea midline, no adenopathy;    thyroid:  no enlargement/tenderness/nodules;      Back:     Symmetric, no curvature, ROM normal, no CVA tenderness  Lungs:     Clear to auscultation bilaterally, respirations unlabored  Chest Wall:    No tenderness or deformity   Heart:    Regular rate and rhythm, S1 and S2 normal, no murmur, rub   or gallop  Breast Exam:    No tenderness, masses, or nipple abnormality  Abdomen:     Soft, non-tender, bowel sounds active all four quadrants,    no masses, no organomegaly  Genitalia:   Deferred   Rectal:    Deferred   Extremities:   Extremities normal, atraumatic, no cyanosis or edema  Pulses:   2+ and symmetric all extremities  Skin:   Skin color, texture, turgor normal, no rashes or lesions, hyperkeratotic, slightly fissured area on R vermilion border   Lymph nodes:   Cervical, supraclavicular, and axillary nodes normal  Neurologic:   CNII-XII intact, normal strength, sensation and reflexes    throughout        Assessment & Plan:

## 2014-10-15 NOTE — Assessment & Plan Note (Signed)
A: well controlled on lisinopril 10 daily P: CMP Refill lisinopril as long as Cr normal  F/u in one year

## 2014-10-15 NOTE — Assessment & Plan Note (Signed)
A: hx of HLD diet controlled  P: fasting lipids

## 2014-10-16 LAB — VITAMIN D 25 HYDROXY (VIT D DEFICIENCY, FRACTURES): VIT D 25 HYDROXY: 38 ng/mL (ref 30–100)

## 2014-10-17 ENCOUNTER — Telehealth: Payer: Self-pay | Admitting: Family Medicine

## 2014-10-17 DIAGNOSIS — E785 Hyperlipidemia, unspecified: Secondary | ICD-10-CM

## 2014-10-17 NOTE — Telephone Encounter (Signed)
Called patient with lab results.  

## 2014-10-17 NOTE — Assessment & Plan Note (Signed)
  High cholesterol total and LDL, 10 year CVD risk 1.5 %, not a statin benefit group, could start fish oil, should be on a low fat and low carb diet.

## 2014-10-28 ENCOUNTER — Other Ambulatory Visit: Payer: Self-pay | Admitting: Family Medicine

## 2014-10-28 ENCOUNTER — Ambulatory Visit: Payer: Self-pay | Admitting: Family Medicine

## 2014-11-05 ENCOUNTER — Other Ambulatory Visit: Payer: Self-pay | Admitting: Physician Assistant

## 2014-11-12 ENCOUNTER — Other Ambulatory Visit: Payer: Self-pay | Admitting: Family Medicine

## 2014-11-12 MED ORDER — LISINOPRIL 10 MG PO TABS: 10.0000 mg | ORAL_TABLET | Freq: Every day | ORAL | Status: DC

## 2015-05-28 ENCOUNTER — Other Ambulatory Visit: Payer: Self-pay | Admitting: Family Medicine

## 2015-08-18 ENCOUNTER — Other Ambulatory Visit: Payer: Self-pay | Admitting: Family Medicine

## 2015-08-20 ENCOUNTER — Other Ambulatory Visit: Payer: Self-pay

## 2015-08-20 DIAGNOSIS — I10 Essential (primary) hypertension: Secondary | ICD-10-CM

## 2015-08-20 MED ORDER — LISINOPRIL 10 MG PO TABS: 10.0000 mg | ORAL_TABLET | Freq: Every day | ORAL | Status: DC

## 2015-09-21 ENCOUNTER — Ambulatory Visit: Payer: Medicaid Other | Attending: Family Medicine | Admitting: Pharmacist

## 2015-09-21 DIAGNOSIS — Z0000 Encounter for general adult medical examination without abnormal findings: Secondary | ICD-10-CM | POA: Diagnosis present

## 2015-09-24 ENCOUNTER — Ambulatory Visit: Payer: Medicaid Other | Attending: Family Medicine | Admitting: Pharmacist

## 2015-09-24 DIAGNOSIS — Z0000 Encounter for general adult medical examination without abnormal findings: Secondary | ICD-10-CM

## 2015-09-24 NOTE — Progress Notes (Signed)
Patient here for Td booster vaccination. She reports that she needs it to register for classes. Her records in Epic show that the last Tdap or Td booster was given in 2009 and therefore she is not due for it. However, her [place] Immunization Records show that the last one was given in 2006 and that she is due for it.   We only have the Tdap and refer patients to the [facility] Department for the Td booster. Instructed patient to go to the health department for the vaccination. Patient reported that she heard that the Health Department is not giving vaccinations now but will follow up with them. I am not sure that this is accurate but patient could also get the immunization from the [place] at school.  Nicoletta Ba, PharmD, BCPS, [facility] [phone]

## 2015-09-30 ENCOUNTER — Other Ambulatory Visit: Payer: Self-pay | Admitting: *Deleted

## 2015-09-30 MED ORDER — LISINOPRIL 10 MG PO TABS: 10.0000 mg | ORAL_TABLET | Freq: Every day | ORAL | Status: DC

## 2015-10-26 ENCOUNTER — Encounter: Payer: BC Managed Care – PPO | Admitting: Family Medicine

## 2015-10-28 ENCOUNTER — Encounter: Payer: Self-pay | Admitting: Family Medicine

## 2015-10-28 ENCOUNTER — Ambulatory Visit: Payer: Medicaid Other | Attending: Family Medicine | Admitting: Family Medicine

## 2015-10-28 VITALS — BP 122/74 | HR 78 | Temp 97.8°F | Resp 16 | Ht 64.0 in | Wt 182.0 lb

## 2015-10-28 DIAGNOSIS — Z79899 Other long term (current) drug therapy: Secondary | ICD-10-CM | POA: Insufficient documentation

## 2015-10-28 DIAGNOSIS — Z885 Allergy status to narcotic agent status: Secondary | ICD-10-CM | POA: Diagnosis not present

## 2015-10-28 DIAGNOSIS — Z0000 Encounter for general adult medical examination without abnormal findings: Secondary | ICD-10-CM | POA: Diagnosis not present

## 2015-10-28 DIAGNOSIS — I10 Essential (primary) hypertension: Secondary | ICD-10-CM

## 2015-10-28 DIAGNOSIS — E66811 Obesity, class 1: Secondary | ICD-10-CM

## 2015-10-28 DIAGNOSIS — Z880 Allergy status to penicillin: Secondary | ICD-10-CM | POA: Insufficient documentation

## 2015-10-28 DIAGNOSIS — Z6830 Body mass index (BMI) 30.0-30.9, adult: Secondary | ICD-10-CM | POA: Insufficient documentation

## 2015-10-28 DIAGNOSIS — F3163 Bipolar disorder, current episode mixed, severe, without psychotic features: Secondary | ICD-10-CM

## 2015-10-28 DIAGNOSIS — E669 Obesity, unspecified: Secondary | ICD-10-CM | POA: Diagnosis not present

## 2015-10-28 DIAGNOSIS — E785 Hyperlipidemia, unspecified: Secondary | ICD-10-CM

## 2015-10-28 DIAGNOSIS — Z888 Allergy status to other drugs, medicaments and biological substances status: Secondary | ICD-10-CM | POA: Diagnosis not present

## 2015-10-28 DIAGNOSIS — K219 Gastro-esophageal reflux disease without esophagitis: Secondary | ICD-10-CM

## 2015-10-28 LAB — CBC
HEMATOCRIT: 38 % (ref 36.0–46.0)
HEMOGLOBIN: 13.1 g/dL (ref 12.0–15.0)
MCH: 30.8 pg (ref 26.0–34.0)
MCHC: 34.5 g/dL (ref 30.0–36.0)
MCV: 89.2 fL (ref 78.0–100.0)
MPV: 9.9 fL (ref 8.6–12.4)
Platelets: 278 10*3/uL (ref 150–400)
RBC: 4.26 MIL/uL (ref 3.87–5.11)
RDW: 13.7 % (ref 11.5–15.5)
WBC: 7.7 10*3/uL (ref 4.0–10.5)

## 2015-10-28 LAB — COMPLETE METABOLIC PANEL WITH GFR
ALBUMIN: 4.4 g/dL (ref 3.6–5.1)
ALK PHOS: 65 U/L (ref 33–115)
ALT: 14 U/L (ref 6–29)
AST: 18 U/L (ref 10–35)
BILIRUBIN TOTAL: 0.6 mg/dL (ref 0.2–1.2)
BUN: 13 mg/dL (ref 7–25)
CO2: 28 mmol/L (ref 20–31)
Calcium: 9.5 mg/dL (ref 8.6–10.2)
Chloride: 101 mmol/L (ref 98–110)
Creat: 1.09 mg/dL (ref 0.50–1.10)
GFR, EST NON AFRICAN AMERICAN: 61 mL/min (ref >=60)
GFR, Est African American: 70 mL/min (ref >=60)
GLUCOSE: 80 mg/dL (ref 65–99)
Potassium: 4.4 mmol/L (ref 3.5–5.3)
SODIUM: 139 mmol/L (ref 135–146)
TOTAL PROTEIN: 6.7 g/dL (ref 6.1–8.1)

## 2015-10-28 LAB — LIPID PANEL
Cholesterol: 311 mg/dL — ABNORMAL HIGH (ref 125–200)
HDL: 66 mg/dL (ref >=46)
LDL Cholesterol: 225 mg/dL — ABNORMAL HIGH (ref <130)
Total CHOL/HDL Ratio: 4.7 Ratio (ref <=5.0)
Triglycerides: 100 mg/dL (ref <150)
VLDL: 20 mg/dL (ref <30)

## 2015-10-28 MED ORDER — LISINOPRIL 10 MG PO TABS: 10.0000 mg | ORAL_TABLET | Freq: Every day | ORAL | Status: DC

## 2015-10-28 NOTE — Patient Instructions (Addendum)
Nancy Bradshaw was seen today for annual exam.  Diagnoses and all orders for this visit:  Essential hypertension -     COMPLETE METABOLIC PANEL WITH GFR -     lisinopril (PRINIVIL,ZESTRIL) 10 MG tablet; Take 1 tablet (10 mg total) by mouth daily.  Hyperlipidemia -     Lipid Panel  Bipolar disorder, current episode mixed, severe, without psychotic features (Nancy Bradshaw) -     CBC   To help with triglycerides continue omega 3, also decrease overall carb intake by replacing carbs with healthy fat, fiber and protein.   You will be called with lab results  F/u in one year for wellness/ HTN

## 2015-10-28 NOTE — Progress Notes (Signed)
SUBJECTIVE:  49 y.o. female for annual routine Pap and checkup.  She has no acute complaints or concerns   Current Outpatient Prescriptions  Medication Sig Dispense Refill  . lamoTRIgine (LAMICTAL) 200 MG tablet Take 300 mg by mouth daily. Reported on 10/28/2015    . lisinopril (PRINIVIL,ZESTRIL) 10 MG tablet Take 1 tablet (10 mg total) by mouth daily. 30 tablet 0  . mirtazapine (REMERON SOL-TAB) 45 MG disintegrating tablet Take 45 mg by mouth at bedtime.    . Multiple Vitamin (MULTIVITAMIN) tablet Take 1 tablet by mouth daily.    . ziprasidone (GEODON) 80 MG capsule Take 80 mg by mouth daily.     . Blood Pressure Monitoring (BLOOD PRESSURE MONITOR/L CUFF) MISC 1 Device by Does not apply route daily. (Patient not taking: Reported on 10/28/2015) 1 each 0  . valACYclovir (VALTREX) 500 MG tablet Take 1 tablet (500 mg total) by mouth 2 (two) times daily. (Patient not taking: Reported on 10/28/2015) 10 tablet 0   No current facility-administered medications for this visit.   Allergies: Azithromycin; Codeine; Sudafed; Zoloft; Penicillins; and Sulfa antibiotics  No LMP recorded. Patient is not currently having periods (Reason: Other).  ROS:  Feeling well. No dyspnea or chest pain on exertion. Yes GERD. No abdominal pain, change in bowel habits, black or bloody stools.  No urinary tract symptoms. GYN ROS: normal menses, no abnormal bleeding, pelvic pain or discharge, no breast pain or new or enlarging lumps on self exam. No neurological complaints.  OBJECTIVE:  The patient appears well, alert, oriented x 3, in no distress. BP 122/74 mmHg  Pulse 78  Temp(Src) 97.8 F (36.6 C) (Oral)  Resp 16  Ht 5\' 4"  (1.626 m)  Wt 182 lb (82.555 kg)  BMI 31.22 kg/m2  SpO2 99% ENT normal.  Neck supple. No adenopathy or thyromegaly. PERLA. Lungs are clear, good air entry, no wheezes, rhonchi or rales. S1 and S2 normal, no murmurs, regular rate and rhythm. Abdomen soft without tenderness, guarding, mass or  organomegaly. Extremities show no edema, normal peripheral pulses. Neurological is normal, no focal findings.  BREAST EXAM: breasts appear normal, no suspicious masses, no skin or nipple changes or axillary nodes  PELVIC EXAM: exam declined by the patient, examination not indicated  ASSESSMENT:  well woman  PLAN:  return annually or prn

## 2015-10-28 NOTE — Progress Notes (Signed)
Annual physical  No pain today  No suicidal thought in the past two weeks

## 2015-11-18 ENCOUNTER — Telehealth: Payer: Self-pay | Admitting: *Deleted

## 2015-11-18 NOTE — Telephone Encounter (Signed)
-----   Message from Boykin Nearing, MD sent at 10/29/2015  9:13 AM EST ----- Normal CBC and CMP Total cholesterol and LDL are elevated despite elevation, the overall risk of cardiovascular diease is low at 1.8%, I recommend starting statin at risk of greater than or equal to 5%. Triglycerides are good. I recommend continuing omega 3. Still doing the low carb. When you have the time, increasing exercise or finding ways in your day to day to increase exercise like taking stairs and parking at a distance from your final destination if driving.

## 2015-11-18 NOTE — Telephone Encounter (Signed)
Date of birth verified by pt  Lab results given Pt verbalized understanding 

## 2016-08-10 ENCOUNTER — Encounter: Payer: Self-pay | Admitting: Family Medicine

## 2016-08-10 ENCOUNTER — Ambulatory Visit (INDEPENDENT_AMBULATORY_CARE_PROVIDER_SITE_OTHER): Payer: 59 | Admitting: Family Medicine

## 2016-08-10 VITALS — BP 124/78 | HR 74 | Temp 98.3°F | Resp 16 | Ht 65.0 in | Wt 169.2 lb

## 2016-08-10 DIAGNOSIS — Z114 Encounter for screening for human immunodeficiency virus [HIV]: Secondary | ICD-10-CM

## 2016-08-10 DIAGNOSIS — Z0000 Encounter for general adult medical examination without abnormal findings: Secondary | ICD-10-CM | POA: Diagnosis not present

## 2016-08-10 DIAGNOSIS — Z131 Encounter for screening for diabetes mellitus: Secondary | ICD-10-CM | POA: Diagnosis not present

## 2016-08-10 DIAGNOSIS — E7800 Pure hypercholesterolemia, unspecified: Secondary | ICD-10-CM

## 2016-08-10 DIAGNOSIS — E669 Obesity, unspecified: Secondary | ICD-10-CM

## 2016-08-10 DIAGNOSIS — Z124 Encounter for screening for malignant neoplasm of cervix: Secondary | ICD-10-CM

## 2016-08-10 DIAGNOSIS — F1021 Alcohol dependence, in remission: Secondary | ICD-10-CM

## 2016-08-10 DIAGNOSIS — F3163 Bipolar disorder, current episode mixed, severe, without psychotic features: Secondary | ICD-10-CM

## 2016-08-10 DIAGNOSIS — I10 Essential (primary) hypertension: Secondary | ICD-10-CM | POA: Diagnosis not present

## 2016-08-10 DIAGNOSIS — Z23 Encounter for immunization: Secondary | ICD-10-CM | POA: Diagnosis not present

## 2016-08-10 DIAGNOSIS — K219 Gastro-esophageal reflux disease without esophagitis: Secondary | ICD-10-CM | POA: Diagnosis not present

## 2016-08-10 DIAGNOSIS — Z8349 Family history of other endocrine, nutritional and metabolic diseases: Secondary | ICD-10-CM

## 2016-08-10 DIAGNOSIS — F4310 Post-traumatic stress disorder, unspecified: Secondary | ICD-10-CM | POA: Diagnosis not present

## 2016-08-10 LAB — POCT URINALYSIS DIP (MANUAL ENTRY)
BILIRUBIN UA: NEGATIVE
Blood, UA: NEGATIVE
Glucose, UA: NEGATIVE
Leukocytes, UA: NEGATIVE
Nitrite, UA: NEGATIVE
PROTEIN UA: NEGATIVE
SPEC GRAV UA: 1.01
Urobilinogen, UA: 0.2
pH, UA: 7.5

## 2016-08-10 LAB — LIPID PANEL
CHOLESTEROL: 312 mg/dL — AB (ref 125–200)
HDL: 63 mg/dL (ref >=46)
LDL CALC: 227 mg/dL — AB (ref <130)
TRIGLYCERIDES: 112 mg/dL (ref <150)
Total CHOL/HDL Ratio: 5 Ratio (ref <=5.0)
VLDL: 22 mg/dL (ref <30)

## 2016-08-10 LAB — COMPREHENSIVE METABOLIC PANEL
ALBUMIN: 4.7 g/dL (ref 3.6–5.1)
ALT: 11 U/L (ref 6–29)
AST: 17 U/L (ref 10–35)
Alkaline Phosphatase: 56 U/L (ref 33–115)
BUN: 10 mg/dL (ref 7–25)
CALCIUM: 9.3 mg/dL (ref 8.6–10.2)
CHLORIDE: 102 mmol/L (ref 98–110)
CO2: 22 mmol/L (ref 20–31)
Creat: 0.97 mg/dL (ref 0.50–1.10)
Glucose, Bld: 82 mg/dL (ref 65–99)
POTASSIUM: 4.1 mmol/L (ref 3.5–5.3)
Sodium: 138 mmol/L (ref 135–146)
TOTAL PROTEIN: 6.9 g/dL (ref 6.1–8.1)
Total Bilirubin: 0.7 mg/dL (ref 0.2–1.2)

## 2016-08-10 LAB — CBC WITH DIFFERENTIAL/PLATELET
BASOS ABS: 0 {cells}/uL (ref 0–200)
Basophils Relative: 0 %
EOS ABS: 154 {cells}/uL (ref 15–500)
EOS PCT: 2 %
HEMATOCRIT: 40.1 % (ref 35.0–45.0)
HEMOGLOBIN: 13.9 g/dL (ref 11.7–15.5)
LYMPHS ABS: 1694 {cells}/uL (ref 850–3900)
Lymphocytes Relative: 22 %
MCH: 30.8 pg (ref 27.0–33.0)
MCHC: 34.7 g/dL (ref 32.0–36.0)
MCV: 88.9 fL (ref 80.0–100.0)
MONO ABS: 539 {cells}/uL (ref 200–950)
MPV: 9.8 fL (ref 7.5–12.5)
Monocytes Relative: 7 %
NEUTROS PCT: 69 %
Neutro Abs: 5313 cells/uL (ref 1500–7800)
Platelets: 257 10*3/uL (ref 140–400)
RBC: 4.51 MIL/uL (ref 3.80–5.10)
RDW: 13.1 % (ref 11.0–15.0)
WBC: 7.7 10*3/uL (ref 3.8–10.8)

## 2016-08-10 LAB — TSH: TSH: 1.28 mIU/L

## 2016-08-10 LAB — VITAMIN D 25 HYDROXY (VIT D DEFICIENCY, FRACTURES): Vit D, 25-Hydroxy: 37 ng/mL (ref 30–100)

## 2016-08-10 LAB — HIV ANTIBODY (ROUTINE TESTING W REFLEX): HIV 1&2 Ab, 4th Generation: NONREACTIVE

## 2016-08-10 LAB — VITAMIN B12: VITAMIN B 12: 676 pg/mL (ref 200–1100)

## 2016-08-10 MED ORDER — LISINOPRIL 10 MG PO TABS: 10.0000 mg | ORAL_TABLET | Freq: Every day | ORAL | 3 refills | Status: DC

## 2016-08-10 NOTE — Progress Notes (Signed)
By signing my name below, I, Mesha Guinyard, attest that this documentation has been prepared under the direction and in the presence of Reginia Forts, MD.  Electronically Signed: Verlee Monte, Medical Scribe. 08/10/2016. 9:17 AM.  Subjective:    Patient ID: Nancy Bradshaw, female    DOB: April 17, 1966, 50 y.o.   MRN: [id number]   08/10/2016  Annual Exam (with PAP and Flu shot )   HPI  HPI Comments: Nancy Bradshaw is a 50 y.o. female who presents to the [facility] for complete physical. Pt's last physical was in Dec 2016  Cancer Screening: Breast CA: Pt would like to wait to get her mammogram at 50 y/o due to no FHx of breast CA. Colon CA: Pt has never had colonoscopy done, but is willing to get one done. Cervical CA: Pt's last pap was 3 years ago. Pt no longer has her periods.  HTN: Pt uses to have high bp and she even ran +20 mi every week. Pt recalls every one, except her mom who is hypotensive has HTN.  FHx: Dad 60 in May and has chronic leukemia and HTN. Mom had chronic fatigue and low bp. Pt states every one else has HTN in her family.  Immunizations: Pt would like to get her PNA and flu vaccine. Immunization History  Administered Date(s) Administered  . Hepatitis A, Adult 03/30/2014, 08/10/2016  . Influenza,inj,Quad PF,36+ Mos 08/18/2014, 09/21/2015, 08/10/2016  . Pneumococcal Polysaccharide-23 10/15/2014  . Td 07/17/2002  . Tdap 07/11/2008   Exercise: 20 mins of rigorous walking 4-5 days a week. Occasionally walks on the weekends. Wt Readings from Last 3 Encounters:  08/10/16 169 lb 3.2 oz (76.7 kg)  10/28/15 182 lb (82.6 kg)  10/15/14 179 lb (81.2 kg)   Diet: Pt cut out most carbs and has been eating more fruits and vegetables. Pt eats a big salad with fruits for lunch, and she'll have her carbs for dinner.  Dentist: Pt hasn't went in the last 6 months, but plans on setting up appt.  Allergies: Pt doesn't takes anything regularly since  her allergy symptoms are very mild.  Vision/Hearing: Pt last went 2 years ago and she wears reading glasses. Pt plans on setting up an appt soon.  Visual Acuity Screening   Right eye Left eye Both eyes  Without correction: 20/25 20/25 20/20   With correction:      Depression: Pt states she has a low level of anxiety. Pt was dx as bipolar and hasn't had a manic depressive episode in 6 years. Pt has an appt in Feb with psychiatrics. Depression screen [facility] 2/9 08/10/2016 10/28/2015 10/15/2014  Decreased Interest 0 0 0  Down, Depressed, Hopeless 0 0 0  PHQ - 2 Score 0 0 0   SHx: Pt is a recovered alcoholic and has been sober for 9 years. Pt quit drinking in 2016 and took benzothiadiazine in 2006. Pt started drinking when she was 50 y/o and she started blaking out at 50 y/o- pt was a heavy  binge drinker. Pt stopped smoking for the past 15 years and smoked a pack a day during her heaviest times of smoking. Then she would only smoke when she was drinking.  Pt smoked marijuana but it wasn't her thing.  Cardio: Pt has a genetic benign heart murmer. Pt last felt her murmer a long time ago. Pt denies chest pain. Pt occasionally has HAs. Pt denies leg swelling. Pt no longer has hot flashes, but has them "  once very quarter" that's not that bad.  Diet: Pt drinks about 5 cups/40 oz of coffee a day. In her early 47s, pt would drink about 20 cups of coffee a day.  BM: Pt has a daily BM. Pt has loose bm but suspects it from her . Pt has occasionally GERD. Pt gets up once a night to use the bathroom. Pt denies urinary incontinence, nausea, emesis, and abdominal pain.  Sleep: PT goes to sleep at 8:30pm and she gets up at 5 am.  Review of Systems  Constitutional: Negative for activity change, appetite change, chills, diaphoresis, fatigue, fever and unexpected weight change.  HENT: Negative for congestion, dental problem, drooling, ear discharge, ear pain, facial swelling, hearing loss, mouth sores, nosebleeds,  postnasal drip, rhinorrhea, sinus pressure, sneezing, sore throat, tinnitus, trouble swallowing and voice change.   Eyes: Negative for photophobia, pain, discharge, redness, itching and visual disturbance.  Respiratory: Negative for apnea, cough, choking, chest tightness, shortness of breath, wheezing and stridor.   Cardiovascular: Negative for chest pain, palpitations and leg swelling.  Gastrointestinal: Negative for abdominal distention, abdominal pain, anal bleeding, blood in stool, constipation, diarrhea, nausea, rectal pain and vomiting.  Endocrine: Negative for cold intolerance, heat intolerance, polydipsia, polyphagia and polyuria.  Genitourinary: Negative for decreased urine volume, difficulty urinating, dyspareunia, dysuria, enuresis, flank pain, frequency, genital sores, hematuria, menstrual problem, pelvic pain, urgency, vaginal bleeding, vaginal discharge and vaginal pain.  Musculoskeletal: Negative for arthralgias, back pain, gait problem, joint swelling, myalgias, neck pain and neck stiffness.  Skin: Negative for color change, pallor, rash and wound.  Allergic/Immunologic: Negative for environmental allergies, food allergies and immunocompromised state.  Neurological: Positive for headaches (occasional). Negative for dizziness, tremors, seizures, syncope, facial asymmetry, speech difficulty, weakness, light-headedness and numbness.  Hematological: Negative for adenopathy. Does not bruise/bleed easily.  Psychiatric/Behavioral: Negative for agitation, behavioral problems, confusion, decreased concentration, dysphoric mood, hallucinations, self-injury, sleep disturbance and suicidal ideas. The patient is not nervous/anxious and is not hyperactive.     Past Medical History:  Diagnosis Date  . Alcohol abuse   . Allergy   . Anxiety Dx 2006  . Bipolar 2 disorder [facility]) Dx 2008   [facility] 2011-2017  . Heart murmur    Dx at age of 11  . Hx of dysmenorrhea 07/20/2012   [facility]  prescribed for severe dysmenorrhea and heavy bleeding with clotting; on OCP, menses lasts 4 days with light bleeding.   Marland Kitchen Hyperlipidemia    Dx at age of 39  . Hypertension Dx 2009  . PTSD (post-traumatic stress disorder) Dx 2008  . Substance abuse    No alcohol since 2008   Past Surgical History:  Procedure Laterality Date  . APPENDECTOMY  2012  . ENDOMETRIAL ABLATION  02/2014   . TUBAL LIGATION  02/2014    Allergies  Allergen Reactions  . Azithromycin Nausea And Vomiting  . Codeine   . Sudafed [Pseudoephedrine Hcl]   . Zoloft [Sertraline Hcl]   . Penicillins Rash  . Sulfa Antibiotics Rash   Current Outpatient Prescriptions  Medication Sig Dispense Refill  . lamoTRIgine (LAMICTAL) 200 MG tablet Take 300 mg by mouth daily. Reported on 10/28/2015    . lisinopril (PRINIVIL,ZESTRIL) 10 MG tablet Take 1 tablet (10 mg total) by mouth daily. 90 tablet 3  . mirtazapine (REMERON SOL-TAB) 45 MG disintegrating tablet Take 45 mg by mouth at bedtime.    . Multiple Vitamin (MULTIVITAMIN) tablet Take 1 tablet by mouth daily.    . ranitidine (ZANTAC) 150  MG tablet Take 150 mg by mouth daily as needed for heartburn.    . ziprasidone (GEODON) 80 MG capsule Take 80 mg by mouth daily.      No current facility-administered medications for this visit.    Social History   Social History  . Marital status: Divorced    Spouse name: N/A  . Number of children: 1   . Years of education: grad schoo   Occupational History  . teacher [employer]   Social History Main Topics  . Smoking status: Former Smoker    Quit date: 11/14/2001  . Smokeless tobacco: Never Used  . Alcohol use No     Comment: In Recovery  x 5 yrs  . Drug use: No  . Sexual activity: Not Currently    Birth control/ protection: Surgical   Other Topics Concern  . 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   Alcoholism age [age]; binge drinking; benzo abuse.      Drugs: none; previous marijuna      Exercise:  Walks 5 days a week for exercise.      Seatbelt: 100%; no texting      Sexual activity: no STDs; males only; total partners = 1 (ex-husband)   Family History  Problem Relation Age of Onset  . Hypertension Father   . Hyperlipidemia Father   . Cancer Father 57    CLL  . Hypertension Maternal Grandmother   . Hypothyroidism Maternal Grandmother     and great aunts   . Cancer Maternal Grandmother 74    on spinal cord, rare   . Hypertension Maternal Grandfather   . Bipolar disorder Paternal Grandmother   . Hypertension Paternal Grandmother   . Hyperlipidemia Paternal Grandmother   . Heart disease Paternal Grandmother   . Chronic fatigue Mother   . Bipolar disorder Sister   . ADD / ADHD Daughter    Objective:    BP 124/78 (BP Location: Left Arm, Patient Position: Sitting, Cuff Size: Normal)   Pulse 74   Temp 98.3 F (36.8 C) (Oral)   Resp 16   Ht 5\' 5"  (1.651 m)   Wt 169 lb 3.2 oz (76.7 kg)   SpO2 98%   BMI 28.16 kg/m  Physical Exam  Constitutional: She is oriented to person, place, and time. She appears well-developed and well-nourished. No distress.  HENT:  Head: Normocephalic and atraumatic.  Right Ear: External ear normal.  Left Ear: External ear normal.  Nose: Nose normal.  Mouth/Throat: Oropharynx is clear and moist.  Eyes: Conjunctivae and EOM are normal. Pupils are equal, round, and reactive to light.  Neck: Normal range of motion and full passive range of motion without pain. Neck supple. No JVD present. Carotid bruit is not present. No thyromegaly present.  Cardiovascular: Normal rate and regular rhythm.  Exam reveals no gallop and no friction rub.   Murmur heard.  Systolic (left) murmur is  present with a grade of 2/6  Pulmonary/Chest: Effort normal and breath sounds normal. She has no wheezes. She has no rales. Right breast exhibits no inverted nipple, no  mass, no nipple discharge, no skin change and no tenderness. Left breast exhibits no inverted nipple, no mass, no nipple discharge and no skin change. Breasts are symmetrical.  Abdominal: Soft. Bowel sounds are normal. She exhibits no distension and no mass. There is no tenderness. There is no rebound and no guarding.  Genitourinary: Rectum normal, vagina normal and uterus normal. There is no rash, tenderness or lesion on the right labia. There is no rash, tenderness or lesion on the left labia. Cervix exhibits no motion tenderness and no friability. Right adnexum displays no mass, no tenderness and no fullness. Left adnexum displays no mass, no tenderness and no fullness.  Genitourinary Comments: Pelvic exam nl Breast exam nl  Musculoskeletal:       Right shoulder: Normal.       Left shoulder: Normal.       Cervical back: Normal.  Lymphadenopathy:    She has no cervical adenopathy.  Neurological: She is alert and oriented to person, place, and time. She has normal reflexes. No cranial nerve deficit. She exhibits normal muscle tone. Coordination normal.  Skin: Skin is warm and dry. No rash noted. She is not diaphoretic. No erythema. No pallor.  Psychiatric: She has a normal mood and affect. Her behavior is normal. Judgment and thought content normal.  Nursing note and vitals reviewed.    Assessment & Plan:   1. Routine physical examination   2. Essential hypertension   3. Gastroesophageal reflux disease without esophagitis   4. Bipolar disorder, current episode mixed, severe, without psychotic features ([place])   5. PTSD (post-traumatic stress disorder)   6. Hyperlipidemia   7. Obesity (BMI 30.0-34.9)   8. Personal history of alcoholism ([place])   9. Family history of hypothyroidism   10. Screening for diabetes mellitus   11.  Screening for HIV (human immunodeficiency virus)   12. Need for prophylactic vaccination and inoculation against influenza   13. Cervical cancer screening    -anticipatory guidance. -obtain age appropriate screening labs; obtain labs. -refills provided.  Orders Placed This Encounter  Procedures  . Flu Vaccine QUAD 36+ mos IM  . Hepatitis A vaccine adult IM  . CBC with Differential/Platelet  . Comprehensive metabolic panel    Order Specific Question:   Has the patient fasted?    Answer:   Yes  . Lipid panel    Order Specific Question:   Has the patient fasted?    Answer:   Yes  . HIV antibody  . VITAMIN D 25 Hydroxy (Vit-D Deficiency, Fractures)  . Vitamin B12  . TSH  . Hemoglobin A1c  . POCT urinalysis dipstick  . EKG 12-Lead   Meds ordered this encounter  Medications  . lisinopril (PRINIVIL,ZESTRIL) 10 MG tablet    Sig: Take 1 tablet (10 mg total) by mouth daily.    Dispense:  90 tablet    Refill:  3    Return in about 6 months (around 02/07/2017) for recheck high blood pressure.  I personally performed the services described in this documentation, which was scribed in my presence. The recorded information has been reviewed and considered.  Dera Vanaken Elayne Guerin, M.D. [facility] [address] [place], [place]  [phone] phone [phone] fax

## 2016-08-10 NOTE — Patient Instructions (Addendum)
   IF you received an x-ray today, you will receive an invoice from [facility]. Please contact [facility] at [phone] with questions or concerns regarding your invoice.   IF you received labwork today, you will receive an invoice from [facility]. Please contact [facility] at [phone] with questions or concerns regarding your invoice.   Our billing staff will not be able to assist you with questions regarding bills from these companies.  You will be contacted with the lab results as soon as they are available. The fastest way to get your results is to activate your My Chart account. Instructions are located on the last page of this paperwork. If you have not heard from us regarding the results in 2 weeks, please contact this office.     We recommend that you schedule a mammogram for breast cancer screening. Typically, you do not need a referral to do this. Please contact a local imaging center to schedule your mammogram.  [facility] - [phone]  *ask for the Radiology Department [facility]) - [phone] or [phone]  [facility] - [phone] [facility] - [phone] [facility]  - [phone]  *ask for the Radiology Department [facility] - [phone]  *ask for the Radiology Department [facility] - [phone]  *ask for the Mammography Department [facility] - [phone]   Keeping You Healthy  Get These Tests 1. Blood Pressure- Have your blood pressure checked once a year by your health care provider.  Normal blood pressure is 120/80. 2. Weight- Have your body mass index (BMI) calculated to screen for obesity.  BMI is measure of body fat based on height and weight.  You can also calculate your own BMI at www.nhlbisupport.com/bmi/. 3. Cholesterol- Have your cholesterol checked every 5 years starting at  age 20 then yearly starting at age 45. 4. Chlamydia, HIV, and other sexually transmitted diseases- Get screened every year until age 25, then within three months of each new sexual provider. 5. Pap Test - Every 1-5 years; discuss with your health care provider. 6. Mammogram- Every 1-2 years starting at age 40--50  Take these medicines  Calcium with Vitamin D-Your body needs 1200 mg of Calcium each day and 800-1000 IU of Vitamin D daily.  Your body can only absorb 500 mg of Calcium at a time so Calcium must be taken in 2 or 3 divided doses throughout the day.  Multivitamin with folic acid- Once daily if it is possible for you to become pregnant.  Get these Immunizations  Gardasil-Series of three doses; prevents HPV related illness such as genital warts and cervical cancer.  Menactra-Single dose; prevents meningitis.  Tetanus shot- Every 10 years.  Flu shot-Every year.  Take these steps 1. Do not smoke-Your healthcare provider can help you quit.  For tips on how to quit go to www.smokefree.gov or call 1-800 QUITNOW. 2. Be physically active- Exercise 5 days a week for at least 30 minutes.  If you are not already physically active, start slow and gradually work up to 30 minutes of moderate physical activity.  Examples of moderate activity include walking briskly, dancing, swimming, bicycling, etc. 3. Breast Cancer- A self breast exam every month is important for early detection of breast cancer.  For more information and instruction on self breast exams, ask your healthcare provider or www.womenshealth.gov/faq/breast-self-exam.cfm. 4. Eat a healthy diet- Eat a variety of healthy foods such   as fruits, vegetables, whole grains, low fat milk, low fat cheeses, yogurt, lean meats, poultry and fish, beans, nuts, tofu, etc.  For more information go to www. Thenutritionsource.org 5. Drink alcohol in moderation- Limit alcohol intake to one drink or less per day. Never drink and drive. 6. Depression-  Your emotional health is as important as your physical health.  If you're feeling down or losing interest in things you normally enjoy please talk to your healthcare provider about being screened for depression. 7. Dental visit- Brush and floss your teeth twice daily; visit your dentist twice a year. 8. Eye doctor- Get an eye exam at least every 2 years. 9. Helmet use- Always wear a helmet when riding a bicycle, motorcycle, rollerblading or skateboarding. 10. Safe sex- If you may be exposed to sexually transmitted infections, use a condom. 11. Seat belts- Seat belts can save your live; always wear one. 12. Smoke/Carbon Monoxide detectors- These detectors need to be installed on the appropriate level of your home. Replace batteries at least once a year. 13. Skin cancer- When out in the sun please cover up and use sunscreen 15 SPF or higher. 14. Violence- If anyone is threatening or hurting you, please tell your healthcare provider.         

## 2016-08-11 LAB — HEMOGLOBIN A1C
Hgb A1c MFr Bld: 5.1 % (ref <5.7)
MEAN PLASMA GLUCOSE: 100 mg/dL

## 2016-08-12 LAB — PAP IG, CT-NG NAA, HPV HIGH-RISK
Chlamydia Probe Amp: NOT DETECTED
GC Probe Amp: NOT DETECTED
HPV DNA HIGH RISK: NOT DETECTED

## 2016-11-18 ENCOUNTER — Encounter: Payer: Self-pay | Admitting: Family Medicine

## 2016-11-18 MED ORDER — ATORVASTATIN CALCIUM 10 MG PO TABS: 10.0000 mg | ORAL_TABLET | Freq: Every day | ORAL | 3 refills | Status: DC

## 2016-11-18 NOTE — Addendum Note (Signed)
Addended by: Wardell Honour on: 11/18/2016 01:25 PM   Modules accepted: Orders

## 2016-12-22 ENCOUNTER — Ambulatory Visit (INDEPENDENT_AMBULATORY_CARE_PROVIDER_SITE_OTHER): Payer: 59 | Admitting: Psychiatry

## 2016-12-22 ENCOUNTER — Encounter (HOSPITAL_COMMUNITY): Payer: Self-pay | Admitting: Psychiatry

## 2016-12-22 VITALS — BP 122/70 | HR 74 | Ht 61.75 in | Wt 167.8 lb

## 2016-12-22 DIAGNOSIS — F1010 Alcohol abuse, uncomplicated: Secondary | ICD-10-CM | POA: Diagnosis not present

## 2016-12-22 DIAGNOSIS — Z888 Allergy status to other drugs, medicaments and biological substances status: Secondary | ICD-10-CM

## 2016-12-22 DIAGNOSIS — Z87891 Personal history of nicotine dependence: Secondary | ICD-10-CM

## 2016-12-22 DIAGNOSIS — Z882 Allergy status to sulfonamides status: Secondary | ICD-10-CM

## 2016-12-22 DIAGNOSIS — F1011 Alcohol abuse, in remission: Secondary | ICD-10-CM

## 2016-12-22 DIAGNOSIS — F4310 Post-traumatic stress disorder, unspecified: Secondary | ICD-10-CM | POA: Diagnosis not present

## 2016-12-22 DIAGNOSIS — Z79899 Other long term (current) drug therapy: Secondary | ICD-10-CM

## 2016-12-22 DIAGNOSIS — Z808 Family history of malignant neoplasm of other organs or systems: Secondary | ICD-10-CM

## 2016-12-22 DIAGNOSIS — F3181 Bipolar II disorder: Secondary | ICD-10-CM | POA: Diagnosis not present

## 2016-12-22 DIAGNOSIS — Z9851 Tubal ligation status: Secondary | ICD-10-CM

## 2016-12-22 DIAGNOSIS — Z818 Family history of other mental and behavioral disorders: Secondary | ICD-10-CM

## 2016-12-22 DIAGNOSIS — Z880 Allergy status to penicillin: Secondary | ICD-10-CM

## 2016-12-22 DIAGNOSIS — Z9889 Other specified postprocedural states: Secondary | ICD-10-CM

## 2016-12-22 DIAGNOSIS — F411 Generalized anxiety disorder: Secondary | ICD-10-CM | POA: Diagnosis not present

## 2016-12-22 DIAGNOSIS — Z8249 Family history of ischemic heart disease and other diseases of the circulatory system: Secondary | ICD-10-CM

## 2016-12-22 MED ORDER — MIRTAZAPINE 45 MG PO TABS: 45.0000 mg | ORAL_TABLET | Freq: Every day | ORAL | 2 refills | Status: DC

## 2016-12-22 MED ORDER — LAMOTRIGINE 200 MG PO TABS
300.0000 mg | ORAL_TABLET | Freq: Every day | ORAL | 2 refills | Status: DC
Start: 2016-12-22 — End: 2017-01-09

## 2016-12-22 MED ORDER — ZIPRASIDONE HCL 80 MG PO CAPS: 80.0000 mg | ORAL_CAPSULE | Freq: Every day | ORAL | 2 refills | Status: DC

## 2016-12-22 MED ORDER — LAMOTRIGINE 200 MG PO TABS: 300.0000 mg | ORAL_TABLET | Freq: Every day | ORAL | 2 refills | Status: DC
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
## 2017-01-09 ENCOUNTER — Other Ambulatory Visit (HOSPITAL_COMMUNITY): Payer: Self-pay

## 2017-01-09 DIAGNOSIS — F3181 Bipolar II disorder: Secondary | ICD-10-CM

## 2017-01-09 MED ORDER — LAMOTRIGINE 200 MG PO TABS: 300.0000 mg | ORAL_TABLET | Freq: Every day | ORAL | 2 refills | Status: DC

## 2017-02-14 ENCOUNTER — Ambulatory Visit: Payer: 59 | Admitting: Family Medicine

## 2017-03-14 ENCOUNTER — Ambulatory Visit: Payer: 59 | Admitting: Family Medicine

## 2017-03-23 ENCOUNTER — Ambulatory Visit (INDEPENDENT_AMBULATORY_CARE_PROVIDER_SITE_OTHER): Payer: 59 | Admitting: Psychiatry

## 2017-03-23 ENCOUNTER — Encounter (HOSPITAL_COMMUNITY): Payer: Self-pay | Admitting: Psychiatry

## 2017-03-23 DIAGNOSIS — Z87891 Personal history of nicotine dependence: Secondary | ICD-10-CM

## 2017-03-23 DIAGNOSIS — F411 Generalized anxiety disorder: Secondary | ICD-10-CM | POA: Diagnosis not present

## 2017-03-23 DIAGNOSIS — F3181 Bipolar II disorder: Secondary | ICD-10-CM | POA: Diagnosis not present

## 2017-03-23 DIAGNOSIS — Z79899 Other long term (current) drug therapy: Secondary | ICD-10-CM

## 2017-03-23 DIAGNOSIS — F1021 Alcohol dependence, in remission: Secondary | ICD-10-CM

## 2017-03-23 DIAGNOSIS — Z818 Family history of other mental and behavioral disorders: Secondary | ICD-10-CM | POA: Diagnosis not present

## 2017-03-23 DIAGNOSIS — F4310 Post-traumatic stress disorder, unspecified: Secondary | ICD-10-CM | POA: Diagnosis not present

## 2017-03-23 MED ORDER — LAMOTRIGINE 200 MG PO TABS: 300.0000 mg | ORAL_TABLET | Freq: Every day | ORAL | 2 refills | Status: DC

## 2017-03-23 MED ORDER — ZIPRASIDONE HCL 80 MG PO CAPS: 80.0000 mg | ORAL_CAPSULE | Freq: Every day | ORAL | 2 refills | Status: DC

## 2017-03-23 MED ORDER — MIRTAZAPINE 45 MG PO TABS: 45.0000 mg | ORAL_TABLET | Freq: Every day | ORAL | 2 refills | Status: DC

## 2017-03-23 NOTE — Progress Notes (Signed)
[place] MD/PA/NP OP Progress Note  03/23/2017 4:41 PM Nancy Bradshaw  MRN:  [id number]  Chief Complaint:  Chief Complaint    Follow-up      HPI: "this is the happiest I have ever been".  Pt denies depression. Denies anhedonia, isolation, crying spells, low motivation, poor hygiene, worthlessness and hopelessness. Denies SI/HI.  Sleeping about 8-9 hrs/night. Appetite and energy are good.  Denies manic and hypomanic symptoms including periods of decreased need for sleep, increased energy, mood lability, impulsivity, FOI, and excessive spending. States she always runs "a little hypomanic".  PTSD- no issues in the last 6 yrs.   States anxiety is mild and tolerable. She feels she is using good coping skills.   Taking meds as prescribed and reports SE of diarrhea every morning since increase in Geodon. Pt states it not concerned by it.   Visit Diagnosis:    ICD-9-CM ICD-10-CM   1. Bipolar II disorder in full remission ([place]) 296.89 F31.81 ziprasidone (GEODON) 80 MG capsule     mirtazapine (REMERON) 45 MG tablet     lamoTRIgine (LAMICTAL) 200 MG tablet  2. PTSD (post-traumatic stress disorder) 309.81 F43.10 mirtazapine (REMERON) 45 MG tablet  3. GAD (generalized anxiety disorder) 300.02 F41.1 mirtazapine (REMERON) 45 MG tablet        Past Psychiatric History:  Dx:  Post partum depression after first daughter was born in 2003. Bipolar II in 2005; PTSD dx in 2008; Alcohol use disorder in remission Meds: Zoloft- took it for 3 days and it causes severe suicidal ideations; Paxil- caused hypomania; Ativan; Xanax; Lamictal Previous psychiatrist/therapist: Dr. Paula Libra; prior to that Dr. Debbe Bales Hospitalizations: [facility] in 2004 after starting Zoloft for 4 days SIB: denies Suicide attempts: denies but does have hx of SI, last time seriously was in 2008 Hx of violent behavior towards others: denies Current access to guns: denies Hx of abuse: yes see PTSD Military Hx: denies Hx of  Seizures: denies Hx of TBI: denies   Previous Psychotropic Medications: Yes   Substance Abuse History in the last 12 months:  No.  Consequences of Substance Abuse: Medical Consequences:  Pt has been sober from alcohol since 2006 and benzos since 2008. Reports she goes to [place] meeting 3x/week and has sponser. Pt is sponsering 2 women right now. Pt denies any hx of DUI/DWI and detox/rehab. Pt reports she was binge drinker and would often black out.  Legal Consequences:  denies Family Consequences:  broken relationships Blackouts:  yes DT's: denies Withdrawal Symptoms:   Nausea Tremors Vomiting   Past Medical History:  Past Medical History:  Diagnosis Date  . Alcohol abuse   . Allergy   . Anxiety Dx 2006  . Bipolar 2 disorder [facility]) Dx 2008   [facility] 2011-2017  . Heart murmur    Dx at age of 89  . Hx of dysmenorrhea 07/20/2012   Seasonale prescribed for severe dysmenorrhea and heavy bleeding with clotting; on OCP, menses lasts 4 days with light bleeding.   Marland Kitchen Hyperlipidemia    Dx at age of 17  . Hypertension Dx 2009  . PTSD (post-traumatic stress disorder) Dx 2008  . Substance abuse    No alcohol since 2008    Past Surgical History:  Procedure Laterality Date  . APPENDECTOMY  2012  . ENDOMETRIAL ABLATION  02/2014   . TUBAL LIGATION  02/2014     Family Psychiatric and Medical History: Family History  Problem Relation Age of Onset  . Hypertension Father   .  Hyperlipidemia Father   . Cancer Father 27       CLL  . Anxiety disorder Father   . Depression Father   . Hypertension Maternal Grandmother   . Hypothyroidism Maternal Grandmother        and great aunts   . Cancer Maternal Grandmother 74       on spinal cord, rare   . Hypertension Maternal Grandfather   . Bipolar disorder Paternal Grandmother   . Hypertension Paternal Grandmother   . Hyperlipidemia Paternal Grandmother   . Heart disease Paternal Grandmother   . Chronic fatigue Mother   . ADD / ADHD  Daughter   . Anxiety disorder Daughter   . Depression Daughter     Social History:  Social History   Social History  . Marital status: Divorced    Spouse name: N/A  . Number of children: 1   . Years of education: grad schoo   Social History Main Topics  . Smoking status: Former Smoker    Quit date: 11/14/2001  . Smokeless tobacco: Never Used  . Alcohol use No     Comment: In Recovery  since 2006  . Drug use: No  . Sexual activity: Not Currently    Birth control/ protection: Surgical   Other Topics Concern  . None   Social History Narrative   Social Hx:   Current living situation: lives in [place] with mother and daughter   Born and Raised in [place] by both parents. 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   Alcoholism age [age]; binge drinking; benzo abuse stopped in 2008. Pt goes to AA 3x/week      Drugs: none; previous marijuna use about 5 times in her life time      Tobacco: quit smoking in 2002; smoked x 25 years       Allergies:  Allergies  Allergen Reactions  . Azithromycin Nausea And Vomiting  . Codeine   . Sudafed [Pseudoephedrine Hcl]   . Zoloft [Sertraline Hcl]   . Penicillins Rash  . Sulfa Antibiotics Rash    Metabolic Disorder Labs: Lab Results  Component Value Date   HGBA1C 5.1 08/10/2016   MPG 100 08/10/2016   No results found for: PROLACTIN Lab Results  Component Value Date   CHOL 312 (H) 08/10/2016   TRIG 112 08/10/2016   HDL 63 08/10/2016   CHOLHDL 5.0 08/10/2016   VLDL 22 08/10/2016   LDLCALC 227 (H) 08/10/2016   LDLCALC 225 (H) 10/28/2015     Current Medications: Current Outpatient Prescriptions   Medication Sig Dispense Refill  . lamoTRIgine (LAMICTAL) 200 MG tablet Take 1.5 tablets (300 mg total) by mouth daily. 45 tablet 2  . lisinopril (PRINIVIL,ZESTRIL) 10 MG tablet Take 1 tablet (10 mg total) by mouth daily. 90 tablet 3  . mirtazapine (REMERON) 45 MG tablet Take 1 tablet (45 mg total) by mouth at bedtime. 30 tablet 2  . Multiple Vitamin (MULTIVITAMIN) tablet Take 1 tablet by mouth daily.    . ziprasidone (GEODON) 80 MG capsule Take 1 capsule (80 mg total) by mouth daily. 30 capsule 2   No  current facility-administered medications for this visit.      Musculoskeletal: Strength & Muscle Tone: within normal limits Gait & Station: normal Patient leans: N/A  Psychiatric Specialty Exam: Review of Systems  Gastrointestinal: Positive for diarrhea. Negative for abdominal pain, heartburn, nausea and vomiting.  Neurological: Negative for dizziness, tingling, tremors, sensory change and headaches.  Psychiatric/Behavioral: Negative for depression, hallucinations, substance abuse and suicidal ideas. The patient is not nervous/anxious and does not have insomnia.     Blood pressure 112/70, pulse 73, height 5' 4.5" (1.638 m), weight 170 lb (77.1 kg).Body mass index is 28.73 kg/m.  General Appearance: Fairly Groomed  Eye Contact:  Good  Speech:  Clear and Coherent and Normal Rate  Volume:  Normal  Mood:  Euthymic  Affect:  Full Range  Thought Process:  Goal Directed and Descriptions of Associations: Intact  Orientation:  Full (Time, Place, and Person)  Thought Content: Logical   Suicidal Thoughts:  No  Homicidal Thoughts:  No  Memory:  Immediate;   Good Recent;   Good Remote;   Good  Judgement:  Good  Insight:  Good  Psychomotor Activity:  Normal  Concentration:  Concentration: Good and Attention Span: Good  Recall:  Good  Fund of Knowledge: Good  Language: Good  Akathisia:  No  Handed:  Right  AIMS (if indicated):  AIMS:  Facial and Oral Movements  Muscles of Facial  Expression: None, normal  Lips and Perioral Area: None, normal  Jaw: None, normal  Tongue: None, normal Extremity Movements: Upper (arms, wrists, hands, fingers): None, normal  Lower (legs, knees, ankles, toes): None, normal,  Trunk Movements:  Neck, shoulders, hips: None, normal,  Overall Severity : Severity of abnormal movements (highest score from questions above): None, normal  Incapacitation due to abnormal movements: None, normal  Patient's awareness of abnormal movements (rate only patient's report): No Awareness, Dental Status  Current problems with teeth and/or dentures?: No  Does patient usually wear dentures?: No     Assets:  Communication Skills Desire for Improvement Housing  ADL's:  Intact  Cognition: WNL  Sleep:  good     Treatment Plan Summary:Medication management  Assessment: Bipolar II disorder; PTSD; GAD; Alcohol use disorder in remission   Medication management with supportive therapy. Risks/benefits and SE of the medication discussed. Pt verbalized understanding and verbal consent obtained for treatment.  Affirm with the patient that the medications are taken as ordered. Patient expressed understanding of how their medications were to be used.   Meds: Geodon 80mg  po qD for Bipolar disorder Remeron 45mg  po q D for mood and anxiety Lamictal 300mg  po qD for Bipolar disorder   Labs: none   Therapy: brief supportive therapy provided. Discussed psychosocial stressors in detail.     Consultations:  Therapy once every 3 months with Lavena Bullion  Pt denies SI and is at an acute low risk for suicide. Patient told to call clinic if any problems occur. Patient advised to go to ER if they should develop SI/HI, side effects, or if symptoms worsen. Has crisis numbers to call if needed. Pt verbalized understanding.  F/up in 3 months or sooner if needed   Charlcie Cradle, MD 03/23/2017, 4:41 PM

## 2017-03-29 ENCOUNTER — Ambulatory Visit: Payer: 59 | Admitting: Family Medicine

## 2017-04-05 ENCOUNTER — Ambulatory Visit (INDEPENDENT_AMBULATORY_CARE_PROVIDER_SITE_OTHER): Payer: 59 | Admitting: Family Medicine

## 2017-04-05 ENCOUNTER — Encounter: Payer: Self-pay | Admitting: Family Medicine

## 2017-04-05 VITALS — BP 111/70 | HR 66 | Temp 98.6°F | Resp 16 | Ht 64.74 in | Wt 168.2 lb

## 2017-04-05 DIAGNOSIS — F1021 Alcohol dependence, in remission: Secondary | ICD-10-CM

## 2017-04-05 DIAGNOSIS — I10 Essential (primary) hypertension: Secondary | ICD-10-CM

## 2017-04-05 DIAGNOSIS — F4310 Post-traumatic stress disorder, unspecified: Secondary | ICD-10-CM | POA: Diagnosis not present

## 2017-04-05 DIAGNOSIS — Z8349 Family history of other endocrine, nutritional and metabolic diseases: Secondary | ICD-10-CM | POA: Diagnosis not present

## 2017-04-05 DIAGNOSIS — E7800 Pure hypercholesterolemia, unspecified: Secondary | ICD-10-CM

## 2017-04-05 DIAGNOSIS — F3163 Bipolar disorder, current episode mixed, severe, without psychotic features: Secondary | ICD-10-CM | POA: Diagnosis not present

## 2017-04-05 DIAGNOSIS — R011 Cardiac murmur, unspecified: Secondary | ICD-10-CM

## 2017-04-05 DIAGNOSIS — K219 Gastro-esophageal reflux disease without esophagitis: Secondary | ICD-10-CM

## 2017-04-05 MED ORDER — LISINOPRIL 10 MG PO TABS: 10.0000 mg | ORAL_TABLET | Freq: Every day | ORAL | 5 refills | Status: DC

## 2017-04-05 NOTE — Progress Notes (Signed)
Subjective:    Patient ID: Nancy Bradshaw, female    DOB: [birth year], 51 y.o.   MRN: [id number]  04/05/2017  Follow-up (blood pressure); Hypertension; and Hyperlipidemia   HPI This 51 y.o. female presents for six month follow-up hypertension and hyperchiolesterolemia.   Has gained weight since last visit; has just lost some back.Doing self care really well except for physical exercise.  Has exercised for 20 minutes for three days in a row.  Not checking blood pressure at home. Might spike during the day; very stressful job.  Meditate every day.  Silent retreats four times per year.  Mindfulness.  Education officer, [employer]. Has had high blood pressure when running marathons and running twenty miles per week.  Taking Red Yeast Rice.  Does not desire side effects of statin therapy.  Father has been intolerant to statin; struggled to find statin that tolerated. Plans to exercise. Eating lots of fruit and vegetables.  Father with heart murmur; seeing cardiologist; his will lead to CHF. Pt underwent echo fifteen years ago; provider stated that echo was WNL.     Immunization History  Administered Date(s) Administered  . Hepatitis A, Adult 03/30/2014, 08/10/2016  . Influenza,inj,Quad PF,36+ Mos 08/18/2014, 09/21/2015, 08/10/2016  . Pneumococcal Polysaccharide-23 10/15/2014  . Td 07/17/2002  . Tdap 07/11/2008   BP Readings from Last 3 Encounters:  04/05/17 111/70  08/10/16 124/78  10/28/15 122/74   Wt Readings from Last 3 Encounters:  04/05/17 168 lb 3.2 oz (76.3 kg)  08/10/16 169 lb 3.2 oz (76.7 kg)  10/28/15 182 lb (82.6 kg)    Review of Systems  Constitutional: Negative for chills, diaphoresis, fatigue and fever.  Eyes: Negative for visual disturbance.  Respiratory: Negative for cough and shortness of breath.   Cardiovascular: Negative for chest pain, palpitations and leg swelling.  Gastrointestinal: Negative for abdominal pain, constipation, diarrhea, nausea and vomiting.  Endocrine:  Negative for cold intolerance, heat intolerance, polydipsia, polyphagia and polyuria.  Neurological: Negative for dizziness, tremors, seizures, syncope, facial asymmetry, speech difficulty, weakness, light-headedness, numbness and headaches.    Past Medical History:  Diagnosis Date  . Alcohol abuse   . Allergy   . Anxiety Dx 2006  . Bipolar 2 disorder [facility]) Dx 2008   [facility] 2011-2017  . Heart murmur    Dx at age of 73  . Hx of dysmenorrhea 07/20/2012   Seasonale prescribed for severe dysmenorrhea and heavy bleeding with clotting; on OCP, menses lasts 4 days with light bleeding.   Marland Kitchen Hyperlipidemia    Dx at age of 29  . Hypertension Dx 2009  . PTSD (post-traumatic stress disorder) Dx 2008  . Substance abuse    No alcohol since 2008   Past Surgical History:  Procedure Laterality Date  . APPENDECTOMY  2012  . ENDOMETRIAL ABLATION  02/2014   . TUBAL LIGATION  02/2014    Allergies  Allergen Reactions  . Azithromycin Nausea And Vomiting  . Codeine   . Sudafed [Pseudoephedrine Hcl]   . Zoloft [Sertraline Hcl]   . Penicillins Rash  . Sulfa Antibiotics Rash    Social History   Social History  . Marital status: Divorced    Spouse name: N/A  . Number of children: 1   . Years of education: grad schoo   Occupational History  . Not on file.   Social History Main Topics  . Smoking status: Former Smoker    Quit date: 11/14/2001  . Smokeless tobacco: Never Used  . Alcohol use No  Comment: In Recovery  since 2006  . Drug use: No  . Sexual activity: Not Currently    Birth control/ protection: Surgical   Other Topics Concern  . Not on file   Social History Narrative   Social Hx:   Current living situation: lives in [place] with mother and daughter   Born and Raised in [place] by both parents. 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   Alcoholism age [age]; binge drinking; benzo abuse stopped in 2008. Pt goes to AA 3x/week      Drugs: none; previous marijuna use about 5 times in her life time      Tobacco: quit smoking in 2002; smoked x 25 years      Family History  Problem Relation Age of Onset  . Hypertension Father   . Hyperlipidemia Father   . Cancer Father 27       CLL  . Anxiety disorder Father   . Depression Father   . Hypertension Maternal Grandmother   . Hypothyroidism Maternal Grandmother        and great aunts   . Cancer Maternal Grandmother 74       on spinal cord, rare   . Hypertension Maternal Grandfather   . Bipolar disorder Paternal Grandmother   . Hypertension Paternal Grandmother   . Hyperlipidemia Paternal Grandmother   . Heart disease Paternal Grandmother   . Chronic fatigue Mother   . ADD / ADHD Daughter   . Anxiety disorder Daughter   . Depression Daughter        Objective:    BP 111/70 (BP Location: Right Arm, Patient Position: Sitting, Cuff Size: Large)   Pulse 66   Temp 98.6 F (37 C) (Oral)   Resp 16   Ht 5' 4.74" (1.644 m)   Wt 168 lb 3.2 oz (76.3 kg)   SpO2 99%   BMI 28.22 kg/m  Physical Exam  Constitutional: She is oriented to person, place, and time. She appears well-developed and well-nourished. No distress.  HENT:  Head: Normocephalic and atraumatic.  Right Ear: External ear normal.  Left Ear: External ear normal.  Nose: Nose normal.  Mouth/Throat: Oropharynx is clear and moist.  Eyes: Conjunctivae and EOM are normal. Pupils are equal, round, and reactive to light.  Neck: Normal range of motion. Neck supple. Carotid bruit is not present. No thyromegaly present.  Cardiovascular: Normal rate, regular rhythm and intact distal pulses.  Exam reveals no gallop and no  friction rub.   Murmur heard.  Systolic murmur is present with a grade of 2/6  Pulmonary/Chest: Effort normal and breath sounds normal. She has no wheezes. She has no rales.  Abdominal: Soft. Bowel sounds are normal. She exhibits no distension and no mass. There is no tenderness. There is no rebound and no guarding.  Lymphadenopathy:    She has no cervical adenopathy.  Neurological: She is alert and oriented to person, place, and time. No cranial nerve deficit.  Skin: Skin is warm and dry. No rash noted. She is not diaphoretic. No erythema. No pallor.  Psychiatric: She has a normal mood and affect. Her  behavior is normal.   Depression screen [facility] 2/9 04/05/2017 08/10/2016 10/28/2015 10/15/2014  Decreased Interest 0 0 0 0  Down, Depressed, Hopeless 0 0 0 0  PHQ - 2 Score 0 0 0 0       Assessment & Plan:   1. Essential hypertension   2. Pure hypercholesterolemia   3. Personal history of alcoholism ([place])   4. PTSD (post-traumatic stress disorder)   5. Family history of hypothyroidism   6. Bipolar disorder, current episode mixed, severe, without psychotic features ([place])   7. Gastroesophageal reflux disease without esophagitis   8. Heart murmur    -controlled hypertension; obtain labs; refill provided. -uncontrolled hypercholesterolemia; pt refuses statin therapy; desires trial of exercise, weight loss, and low-cholesterol food choices. -heart murmur chronic; s/p echo fifteen years ago; asymptomatic at this time.  Consider repeat echo in future.  If develops symptoms of chest pain or DOE or leg swelling, recommend repeat echo.   -bipolar disorder managed by psychiatry; stable at this time; excellent work environment. -remaining in recovery from alcoholism.  Doing well.   Orders Placed This Encounter  Procedures  . Comprehensive metabolic panel   Meds ordered this encounter  Medications  . lisinopril (PRINIVIL,ZESTRIL) 10 MG tablet    Sig: Take 1 tablet (10 mg total) by mouth daily.      Dispense:  30 tablet    Refill:  5    Return in about 6 months (around 10/06/2017) for complete physical examiniation.   Anuar Walgren Elayne Guerin, M.D. Primary Care at [facility] previously [facility] [address] [place], [place]  [phone] phone [phone] fax

## 2017-04-05 NOTE — Patient Instructions (Signed)
   IF you received an x-ray today, you will receive an invoice from [facility]. Please contact [facility] at [phone] with questions or concerns regarding your invoice.   IF you received labwork today, you will receive an invoice from [facility]. Please contact [facility] at [phone] with questions or concerns regarding your invoice.   Our billing staff will not be able to assist you with questions regarding bills from these companies.  You will be contacted with the lab results as soon as they are available. The fastest way to get your results is to activate your My Chart account. Instructions are located on the last page of this paperwork. If you have not heard from us regarding the results in 2 weeks, please contact this office.      Fat and Cholesterol Restricted Diet Getting too much fat and cholesterol in your diet may cause health problems. Following this diet helps keep your fat and cholesterol at normal levels. This can keep you from getting sick. What types of fat should I choose?  Choose monosaturated and polyunsaturated fats. These are found in foods such as olive oil, canola oil, flaxseeds, walnuts, almonds, and seeds.  Eat more omega-3 fats. Good choices include salmon, mackerel, sardines, tuna, flaxseed oil, and ground flaxseeds.  Limit saturated fats. These are in animal products such as meats, butter, and cream. They can also be in plant products such as palm oil, palm kernel oil, and coconut oil.  Avoid foods with partially hydrogenated oils in them. These contain trans fats. Examples of foods that have trans fats are stick margarine, some tub margarines, cookies, crackers, and other baked goods. What general guidelines do I need to follow?  Check food labels. Look for the words "trans fat" and "saturated fat."  When preparing a meal:  Fill half of your plate with vegetables and green salads.  Fill one fourth of your plate with whole  grains. Look for the word "whole" as the first word in the ingredient list.  Fill one fourth of your plate with lean protein foods.  Eat more foods that have fiber, like apples, carrots, beans, peas, and barley.  Eat more home-cooked foods. Eat less at restaurants and buffets.  Limit or avoid alcohol.  Limit foods high in starch and sugar.  Limit fried foods.  Cook foods without frying them. Baking, boiling, grilling, and broiling are all great options.  Lose weight if you are overweight. Losing even a small amount of weight can help your overall health. It can also help prevent diseases such as diabetes and heart disease. What foods can I eat? Grains  Whole grains, such as whole wheat or whole grain breads, crackers, cereals, and pasta. Unsweetened oatmeal, bulgur, barley, quinoa, or brown rice. Corn or whole wheat flour tortillas. Vegetables  Fresh or frozen vegetables (raw, steamed, roasted, or grilled). Green salads. Fruits  All fresh, canned (in natural juice), or frozen fruits. Meat and Other Protein Products  Ground beef (85% or leaner), grass-fed beef, or beef trimmed of fat. Skinless chicken or turkey. Ground chicken or turkey. Pork trimmed of fat. All fish and seafood. Eggs. Dried beans, peas, or lentils. Unsalted nuts or seeds. Unsalted canned or dry beans. Dairy  Low-fat dairy products, such as skim or 1% milk, 2% or reduced-fat cheeses, low-fat ricotta or cottage cheese, or plain low-fat yogurt. Fats and Oils  Tub margarines without trans fats. Light or reduced-fat mayonnaise and salad dressings. Avocado. Olive, canola, sesame, or safflower oils. Natural peanut   or almond butter (choose ones without added sugar and oil). The items listed above may not be a complete list of recommended foods or beverages. Contact your dietitian for more options.  What foods are not recommended? Grains  White bread. White pasta. White rice. Cornbread. Bagels, pastries, and croissants.  Crackers that contain trans fat. Vegetables  White potatoes. Corn. Creamed or fried vegetables. Vegetables in a cheese sauce. Fruits  Dried fruits. Canned fruit in light or heavy syrup. Fruit juice. Meat and Other Protein Products  Fatty cuts of meat. Ribs, chicken wings, bacon, sausage, bologna, salami, chitterlings, fatback, hot dogs, bratwurst, and packaged luncheon meats. Liver and organ meats. Dairy  Whole or 2% milk, cream, half-and-half, and cream cheese. Whole milk cheeses. Whole-fat or sweetened yogurt. Full-fat cheeses. Nondairy creamers and whipped toppings. Processed cheese, cheese spreads, or cheese curds. Sweets and Desserts  Corn syrup, sugars, honey, and molasses. Candy. Jam and jelly. Syrup. Sweetened cereals. Cookies, pies, cakes, donuts, muffins, and ice cream. Fats and Oils  Butter, stick margarine, lard, shortening, ghee, or bacon fat. Coconut, palm kernel, or palm oils. Beverages  Alcohol. Sweetened drinks (such as sodas, lemonade, and fruit drinks or punches). The items listed above may not be a complete list of foods and beverages to avoid. Contact your dietitian for more information.  This information is not intended to replace advice given to you by your health care provider. Make sure you discuss any questions you have with your health care provider. Document Released: 05/01/2012 Document Revised: 07/07/2016 Document Reviewed: 01/30/2014 Elsevier Interactive Patient Education  2017 Elsevier Inc.  

## 2017-04-06 LAB — COMPREHENSIVE METABOLIC PANEL
A/G RATIO: 2.1 (ref 1.2–2.2)
ALBUMIN: 4.8 g/dL (ref 3.5–5.5)
ALK PHOS: 65 IU/L (ref 39–117)
ALT: 15 IU/L (ref 0–32)
AST: 15 IU/L (ref 0–40)
BILIRUBIN TOTAL: 0.3 mg/dL (ref 0.0–1.2)
BUN / CREAT RATIO: 17 (ref 9–23)
BUN: 16 mg/dL (ref 6–24)
CO2: 23 mmol/L (ref 18–29)
CREATININE: 0.92 mg/dL (ref 0.57–1.00)
Calcium: 9.7 mg/dL (ref 8.7–10.2)
Chloride: 100 mmol/L (ref 96–106)
GFR calc Af Amer: 85 mL/min/{1.73_m2} (ref >59)
GFR calc non Af Amer: 74 mL/min/{1.73_m2} (ref >59)
GLOBULIN, TOTAL: 2.3 g/dL (ref 1.5–4.5)
Glucose: 87 mg/dL (ref 65–99)
Potassium: 4.5 mmol/L (ref 3.5–5.2)
SODIUM: 140 mmol/L (ref 134–144)
Total Protein: 7.1 g/dL (ref 6.0–8.5)

## 2017-07-13 ENCOUNTER — Ambulatory Visit (INDEPENDENT_AMBULATORY_CARE_PROVIDER_SITE_OTHER): Payer: 59 | Admitting: Psychiatry

## 2017-07-13 ENCOUNTER — Encounter (HOSPITAL_COMMUNITY): Payer: Self-pay | Admitting: Psychiatry

## 2017-07-13 VITALS — BP 124/74 | HR 72 | Ht 64.75 in | Wt 163.0 lb

## 2017-07-13 DIAGNOSIS — F411 Generalized anxiety disorder: Secondary | ICD-10-CM

## 2017-07-13 DIAGNOSIS — Z818 Family history of other mental and behavioral disorders: Secondary | ICD-10-CM

## 2017-07-13 DIAGNOSIS — F4310 Post-traumatic stress disorder, unspecified: Secondary | ICD-10-CM

## 2017-07-13 DIAGNOSIS — Z79899 Other long term (current) drug therapy: Secondary | ICD-10-CM | POA: Diagnosis not present

## 2017-07-13 DIAGNOSIS — Z87891 Personal history of nicotine dependence: Secondary | ICD-10-CM | POA: Diagnosis not present

## 2017-07-13 DIAGNOSIS — F3181 Bipolar II disorder: Secondary | ICD-10-CM | POA: Diagnosis not present

## 2017-07-13 DIAGNOSIS — R42 Dizziness and giddiness: Secondary | ICD-10-CM | POA: Diagnosis not present

## 2017-07-13 DIAGNOSIS — F1011 Alcohol abuse, in remission: Secondary | ICD-10-CM | POA: Diagnosis not present

## 2017-07-13 MED ORDER — ZIPRASIDONE HCL 80 MG PO CAPS: 80.0000 mg | ORAL_CAPSULE | Freq: Every day | ORAL | 2 refills | Status: DC

## 2017-07-13 MED ORDER — LAMOTRIGINE 200 MG PO TABS: 300.0000 mg | ORAL_TABLET | Freq: Every day | ORAL | 2 refills | Status: DC

## 2017-07-13 MED ORDER — MIRTAZAPINE 45 MG PO TABS: 45.0000 mg | ORAL_TABLET | Freq: Every day | ORAL | 2 refills | Status: DC

## 2017-07-13 NOTE — Progress Notes (Signed)
BH MD/PA/NP OP Progress Note  07/13/2017 4:21 PM Nancy Bradshaw  MRN:  [id number]  Chief Complaint:  Chief Complaint    Follow-up     HPI: Pt denies depression. Denies anhedonia, isolation, crying spells, low motivation, poor hygiene, worthlessness and hopelessness. Denies SI/HI.  Sleep is generally ok. It usually takes it her 30-60 to fall asleep. Nights are always hard for her as the trauma happened at night.  Appetite  Energy is good. She works in a fast paced environment.   Denies manic and hypomanic symptoms including periods of decreased need for sleep, increased energy, mood lability, impulsivity, FOI, and excessive spending.  Pt is no longer having nightmares or flashbacks. She avoids crowds and has occasional HV.  Anxiety is always situational- work and family. It happens but it manageable. Pt states her baseline is to be anxious.  Taking meds as prescribed and denies SE.   Visit Diagnosis:    ICD-10-CM   1. Encounter for long-term (current) use of medications Z79.899 Prolactin    Comprehensive metabolic panel    Hemoglobin A1c    Lipid panel    CBC    TSH    EKG  2. Bipolar II disorder in full remission ([place]) F31.81 lamoTRIgine (LAMICTAL) 200 MG tablet    mirtazapine (REMERON) 45 MG tablet    ziprasidone (GEODON) 80 MG capsule  3. PTSD (post-traumatic stress disorder) F43.10 mirtazapine (REMERON) 45 MG tablet  4. GAD (generalized anxiety disorder) F41.1 mirtazapine (REMERON) 45 MG tablet      Past Psychiatric History:  Dx: Post partum depression after first daughter was born in 2003. Bipolar II in 2005; PTSD dx in 2008; Alcohol use disorder in remission Meds: Zoloft- took it for 3 days and it causes severe suicidal ideations; Paxil- caused hypomania; Ativan; Xanax; Lamictal Previous psychiatrist/therapist: Dr. Paula Libra; prior to that Dr. Debbe Bales Hospitalizations: [facility] 2004 after starting Zoloft for 4 days SIB: denies Suicide attempts: denies but  does have hx of SI, last time seriously was in 2008 Hx of violent behavior towards others: denies Current access to guns: denies Hx of abuse: yes see PTSD Military Hx: denies Hx of Seizures: denies Hx of TBI: denies   Previous Psychotropic Medications: Yes   Substance Abuse History in the last 12 months: No.  Consequences of Substance Abuse: Medical Consequences: Pt has been sober from alcohol since 2006 and benzos since 2008. Reports she goes to [place] meeting 3x/week and has sponser. Pt is sponsering 2 women right now. Pt denies any hx of DUI/DWI and detox/rehab. Pt reports she was binge drinker and would often black out.  Legal Consequences: denies Family Consequences: broken relationships Blackouts: yes DT's: denies Withdrawal Symptoms: Nausea Tremors Vomiting  Past Medical History:  Past Medical History:  Diagnosis Date  . Alcohol abuse   . Allergy   . Anxiety Dx 2006  . Bipolar 2 disorder [facility]) Dx 2008   [facility] 2011-2017  . Heart murmur    Dx at age of 63  . Hx of dysmenorrhea 07/20/2012   Seasonale prescribed for severe dysmenorrhea and heavy bleeding with clotting; on OCP, menses lasts 4 days with light bleeding.   Marland Kitchen Hyperlipidemia    Dx at age of 15  . Hypertension Dx 2009  . PTSD (post-traumatic stress disorder) Dx 2008  . Substance abuse    No alcohol since 2008    Past Surgical History:  Procedure Laterality Date  . APPENDECTOMY  2012  . ENDOMETRIAL ABLATION  02/2014   .  TUBAL LIGATION  02/2014     Family Psychiatric History:  Family History  Problem Relation Age of Onset  . Hypertension Father   . Hyperlipidemia Father   . Cancer Father 60       CLL  . Anxiety disorder Father   . Depression Father   . Hypertension Maternal Grandmother   . Hypothyroidism Maternal Grandmother        and great aunts   . Cancer Maternal Grandmother 74       on spinal cord, rare   . Hypertension Maternal Grandfather   . Bipolar disorder Paternal  Grandmother   . Hypertension Paternal Grandmother   . Hyperlipidemia Paternal Grandmother   . Heart disease Paternal Grandmother   . Chronic fatigue Mother   . ADD / ADHD Daughter   . Anxiety disorder Daughter   . Depression Daughter     Social History:  Social History   Social History  . Marital status: Divorced    Spouse name: N/A  . Number of children: 1   . Years of education: grad schoo   Social History Main Topics  . Smoking status: Former Smoker    Quit date: 11/14/2001  . Smokeless tobacco: Never Used  . Alcohol use No     Comment: In Recovery  since 2006  . Drug use: No  . Sexual activity: Not Currently    Birth control/ protection: Surgical   Other Topics Concern  . None   Social History Narrative   Social Hx:   Current living situation: lives in [place] with mother and daughter   Born and Raised in [place] by both parents. 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   Alcoholism age [age]; binge drinking; benzo abuse stopped in 2008. Pt goes to AA 3x/week      Drugs: none; previous marijuna use about 5 times in her life time      Tobacco: quit smoking in 2002; smoked x 25 years       Allergies:  Allergies  Allergen Reactions  . Azithromycin Nausea And Vomiting  . Codeine   . Sudafed [Pseudoephedrine Hcl]   . Zoloft [Sertraline Hcl]   . Penicillins Rash  . Sulfa Antibiotics Rash    Metabolic Disorder Labs: Lab Results  Component Value Date   HGBA1C 5.1 08/10/2016   MPG 100 08/10/2016   No results found for: PROLACTIN Lab Results  Component Value Date   CHOL 312 (H) 08/10/2016   TRIG 112 08/10/2016   HDL 63 08/10/2016    CHOLHDL 5.0 08/10/2016   VLDL 22 08/10/2016   LDLCALC 227 (H) 08/10/2016   LDLCALC 225 (H) 10/28/2015   Lab Results  Component Value Date   TSH 1.28 08/10/2016   TSH 1.392 10/15/2014    Therapeutic Level Labs: No results found for: LITHIUM No results found for: VALPROATE No components found for:  CBMZ  Current Medications: Current Outpatient Prescriptions  Medication Sig Dispense Refill  . lamoTRIgine (LAMICTAL) 200 MG tablet Take 1.5 tablets (300 mg total) by mouth daily. 45 tablet 2  . lisinopril (PRINIVIL,ZESTRIL) 10 MG tablet Take 1 tablet (10  mg total) by mouth daily. 30 tablet 5  . mirtazapine (REMERON) 45 MG tablet Take 1 tablet (45 mg total) by mouth at bedtime. 30 tablet 2  . Multiple Vitamin (MULTIVITAMIN) tablet Take 1 tablet by mouth daily.    . ziprasidone (GEODON) 80 MG capsule Take 1 capsule (80 mg total) by mouth daily. 30 capsule 2   No current facility-administered medications for this visit.      Musculoskeletal: Strength & Muscle Tone: within normal limits Gait & Station: normal Patient leans: N/A  Psychiatric Specialty Exam: Review of Systems  Neurological: Positive for dizziness and headaches. Negative for tingling and sensory change.  Psychiatric/Behavioral: Negative for depression, hallucinations, substance abuse and suicidal ideas. The patient is nervous/anxious. The patient does not have insomnia.     Blood pressure 124/74, pulse 72, height 5' 4.75" (1.645 m), weight 163 lb (73.9 kg), SpO2 99 %.Body mass index is 27.33 kg/m.  General Appearance: Fairly Groomed  Eye Contact:  Good  Speech:  Clear and Coherent and Normal Rate  Volume:  Normal  Mood:  Anxious  Affect:  Congruent  Thought Process:  Goal Directed and Descriptions of Associations: Intact  Orientation:  Full (Time, Place, and Person)  Thought Content: Logical   Suicidal Thoughts:  No  Homicidal Thoughts:  No  Memory:  Immediate;   Good Recent;   Good Remote;   Good  Judgement:   Good  Insight:  Good  Psychomotor Activity:  Normal  Concentration:  Concentration: Good and Attention Span: Good  Recall:  Good  Fund of Knowledge: Good  Language: Good  Akathisia:  No  Handed:  Right  AIMS (if indicated): not done  Assets:  Communication Skills Desire for Improvement Housing  ADL's:  Intact  Cognition: WNL  Sleep:  Good   Screenings: PHQ2-9     Office Visit from 04/05/2017 in [facility] at [facility] from 08/10/2016 in Primary Care at [place] from 10/28/2015 in [place] Office Visit from 10/15/2014 in [place]  PHQ-2 Total Score  0  0  0  0       Assessment and Plan: Bipolar II disorder; PTSD; GAD; Alcohol use disorder in remission   Medication management with supportive therapy. Risks/benefits and SE of the medication discussed. Pt verbalized understanding and verbal consent obtained for treatment.  Affirm with the patient that the medications are taken as ordered. Patient expressed understanding of how their medications were to be used.    Meds:  Geodon 80mg  po qD for Bipolar disorder Remeron 45mg  po q D for mood and anxiety Lamictal 300mg  po qD for Bipolar disorder   Labs: ordered CBC, CMP, HbA1c, Lipid panel, TSH, Prolactin level, EKG   Therapy: brief supportive therapy provided. Discussed psychosocial stressors in detail.    Consultations: encouraged to follow up with therapist -encouraged to follow up with PCP  Pt denies SI and is at an acute low risk for suicide. Patient told to call clinic if any problems occur. Patient advised to go to ER if they should develop SI/HI, side effects, or if symptoms worsen. Has crisis numbers to call if needed. Pt verbalized understanding.  F/up in 3 months or sooner if needed    Charlcie Cradle, MD 07/13/2017, 4:21 PM

## 2017-07-20 ENCOUNTER — Other Ambulatory Visit: Payer: Self-pay | Admitting: Family Medicine

## 2017-07-20 DIAGNOSIS — I10 Essential (primary) hypertension: Secondary | ICD-10-CM

## 2017-08-27 DIAGNOSIS — Z23 Encounter for immunization: Secondary | ICD-10-CM | POA: Diagnosis not present

## 2017-10-12 ENCOUNTER — Emergency Department (HOSPITAL_COMMUNITY)
Admission: EM | Admit: 2017-10-12 | Discharge: 2017-10-12 | Disposition: A | Discharge status: Home / Self Care | Payer: 59 | Attending: Emergency Medicine | Admitting: Emergency Medicine

## 2017-10-12 ENCOUNTER — Ambulatory Visit: Payer: Self-pay | Admitting: *Deleted

## 2017-10-12 DIAGNOSIS — Z87891 Personal history of nicotine dependence: Secondary | ICD-10-CM | POA: Insufficient documentation

## 2017-10-12 DIAGNOSIS — H53141 Visual discomfort, right eye: Secondary | ICD-10-CM | POA: Diagnosis not present

## 2017-10-12 DIAGNOSIS — X58XXXA Exposure to other specified factors, initial encounter: Secondary | ICD-10-CM | POA: Diagnosis not present

## 2017-10-12 DIAGNOSIS — T65891A Toxic effect of other specified substances, accidental (unintentional), initial encounter: Secondary | ICD-10-CM | POA: Diagnosis not present

## 2017-10-12 DIAGNOSIS — H10211 Acute toxic conjunctivitis, right eye: Secondary | ICD-10-CM | POA: Insufficient documentation

## 2017-10-12 DIAGNOSIS — I10 Essential (primary) hypertension: Secondary | ICD-10-CM | POA: Diagnosis not present

## 2017-10-12 DIAGNOSIS — Y929 Unspecified place or not applicable: Secondary | ICD-10-CM | POA: Diagnosis not present

## 2017-10-12 DIAGNOSIS — S0591XA Unspecified injury of right eye and orbit, initial encounter: Secondary | ICD-10-CM | POA: Diagnosis present

## 2017-10-12 DIAGNOSIS — Y9389 Activity, other specified: Secondary | ICD-10-CM | POA: Insufficient documentation

## 2017-10-12 DIAGNOSIS — Y998 Other external cause status: Secondary | ICD-10-CM | POA: Insufficient documentation

## 2017-10-12 MED ORDER — ERYTHROMYCIN 5 MG/GM OP OINT: TOPICAL_OINTMENT | OPHTHALMIC | 0 refills | Status: DC

## 2017-10-12 MED ORDER — TETRACAINE HCL 0.5 % OP SOLN
2.0000 [drp] | Freq: Once | OPHTHALMIC | Status: AC
  Administered 2017-10-12: 2 [drp] via OPHTHALMIC
  Filled 2017-10-12: qty 4

## 2017-10-12 MED ORDER — FLUORESCEIN SODIUM 1 MG OP STRP
1.0000 | ORAL_STRIP | Freq: Once | OPHTHALMIC | Status: AC
  Administered 2017-10-12: 1 via OPHTHALMIC
  Filled 2017-10-12: qty 1

## 2017-10-12 NOTE — ED Triage Notes (Signed)
Pt reports mistaking essential oil or eye drops last night and placing in right eye. Pt presents w/ right eye redness and irritation. Pt reports only using corrective lenses to read. Pt reports 7/10 right eye pain. Pt A+OX4, speaking in complete sentences, ambulatory independently to triage.

## 2017-10-12 NOTE — ED Notes (Signed)
Pt verbalizes understanding of d/c paperwork, follow up instructions, and medications. Pt A/O x4, ambulatory. All belongings with patient upon departure.  

## 2017-10-12 NOTE — ED Provider Notes (Signed)
[facility] DEPT Provider Note   CSN: [id number] Arrival date & time: 10/12/17  9563     History   Chief Complaint Chief Complaint  Patient presents with  . Eye Injury    Right    HPI Nancy Bradshaw is a 51 y.o. female.  HPI   51yo female presents with concern for right eye injury.  Reports last night around 1AM she thought she was dropping eye drop in here eye and instead dropped lavender essential oil into her eye. Reports immediate burning to her right eye. Reports she has 7/10 pain.  Reports redness, irritation. Irrigated her eyes with water and salt water solution.   Past Medical History:  Diagnosis Date  . Alcohol abuse   . Allergy   . Anxiety Dx 2006  . Bipolar 2 disorder [facility]) Dx 2008   [facility] 2011-2017  . Heart murmur    Dx at age of 83  . Hx of dysmenorrhea 07/20/2012   Seasonale prescribed for severe dysmenorrhea and heavy bleeding with clotting; on OCP, menses lasts 4 days with light bleeding.   Marland Kitchen Hyperlipidemia    Dx at age of 32  . Hypertension Dx 2009  . PTSD (post-traumatic stress disorder) Dx 2008  . Substance abuse    No alcohol since 2008    Patient Active Problem List   Diagnosis Date Noted  . Esophageal reflux 10/28/2015  . Obesity (BMI 30.0-34.9) 10/28/2015  . PTSD (post-traumatic stress disorder) 10/15/2014  . Hyperlipidemia 10/15/2014  . Family history of hypothyroidism 10/15/2014  . Bipolar disorder ([place]) 06/21/2013  . Personal history of alcoholism ([place]) 07/20/2012  . HTN (hypertension) 07/20/2012    Past Surgical History:  Procedure Laterality Date  . APPENDECTOMY  2012  . ENDOMETRIAL ABLATION  02/2014   . TUBAL LIGATION  02/2014     OB History    Gravida Para Term Preterm AB Living   1         1   SAB TAB Ectopic Multiple Live Births                  Obstetric Comments   Pre-eclampsia        Home Medications    Prior to Admission medications   Medication Sig Start Date End  Date Taking? Authorizing Provider  lamoTRIgine (LAMICTAL) 200 MG tablet Take 1.5 tablets (300 mg total) by mouth daily. 07/13/17  Yes Charlcie Cradle, MD  lisinopril (PRINIVIL,ZESTRIL) 10 MG tablet Take 1 tablet (10 mg total) by mouth daily. 04/05/17  Yes Wardell Honour, MD  LYSINE PO Take 1 tablet by mouth daily.   Yes [provider]  mirtazapine (REMERON) 45 MG tablet Take 1 tablet (45 mg total) by mouth at bedtime. 07/13/17  Yes Charlcie Cradle, MD  Multiple Vitamin (MULTIVITAMIN) tablet Take 1 tablet by mouth daily.   Yes [provider]  NON FORMULARY Take 1 tablet by mouth daily. "LAJ"   Yes [provider]  polyvinyl alcohol (LIQUIFILM TEARS) 1.4 % ophthalmic solution Place 1 drop into both eyes daily.   Yes [provider]  Red Yeast Rice Extract (RED YEAST RICE PO) Take 1 tablet by mouth daily.   Yes [provider]  ziprasidone (GEODON) 80 MG capsule Take 1 capsule (80 mg total) by mouth daily. 07/13/17  Yes Charlcie Cradle, MD  erythromycin ophthalmic ointment Place a 1/2 inch ribbon of ointment into the lower eyelid four times daily for 7 days. 10/12/17  Gareth Morgan, MD  lisinopril (PRINIVIL,ZESTRIL) 10 MG tablet TAKE 1 TABLET(10 MG) BY MOUTH DAILY Patient not taking: Reported on 10/12/2017 07/21/17   Wardell Honour, MD    Family History Family History  Problem Relation Age of Onset  . Hypertension Father   . Hyperlipidemia Father   . Cancer Father 68       CLL  . Anxiety disorder Father   . Depression Father   . Hypertension Maternal Grandmother   . Hypothyroidism Maternal Grandmother        and great aunts   . Cancer Maternal Grandmother 74       on spinal cord, rare   . Hypertension Maternal Grandfather   . Bipolar disorder Paternal Grandmother   . Hypertension Paternal Grandmother   . Hyperlipidemia Paternal Grandmother   . Heart disease Paternal Grandmother   . Chronic fatigue Mother   . ADD / ADHD Daughter   .  Anxiety disorder Daughter   . Depression Daughter     Social History Social History   Tobacco Use  . Smoking status: Former Smoker    Last attempt to quit: 11/14/2001    Years since quitting: 15.9  . Smokeless tobacco: Never Used  Substance Use Topics  . Alcohol use: No    Alcohol/week: 0.0 oz    Comment: In Recovery  since 2006  . Drug use: No     Allergies   Azithromycin; Codeine; Other; Sudafed [pseudoephedrine hcl]; Zoloft [sertraline hcl]; Penicillins; and Sulfa antibiotics   Review of Systems Review of Systems  Constitutional: Negative for fever.  Eyes: Positive for photophobia, pain and redness. Negative for discharge and visual disturbance.  Genitourinary: Negative for difficulty urinating.  Skin: Negative for rash.  Neurological: Negative for syncope and headaches.     Physical Exam Updated Vital Signs BP 136/80 (BP Location: Left Arm)   Pulse 71   Temp 98.9 F (37.2 C) (Oral)   Resp 16   SpO2 100%   Physical Exam  Constitutional: She is oriented to person, place, and time. She appears well-developed and well-nourished. No distress.  HENT:  Head: Normocephalic and atraumatic.  Eyes: EOM are normal. Pupils are equal, round, and reactive to light. Right conjunctiva is injected.  Slit lamp exam:      The right eye shows no corneal abrasion.  Neck: Normal range of motion.  Cardiovascular: Normal rate, regular rhythm and intact distal pulses.  Pulmonary/Chest: Effort normal. No respiratory distress.  Musculoskeletal: She exhibits no edema or tenderness.  Neurological: She is alert and oriented to person, place, and time.  Skin: Skin is warm and dry. No rash noted. She is not diaphoretic. No erythema.  Nursing note and vitals reviewed.    ED Treatments / Results  Labs (all labs ordered are listed, but only abnormal results are displayed) Labs Reviewed - No data to display  EKG  EKG Interpretation None       Radiology No results  found.  Procedures Procedures (including critical care time)  Medications Ordered in ED Medications  tetracaine (PONTOCAINE) 0.5 % ophthalmic solution 2 drop (2 drops Right Eye Given 10/12/17 0958)  fluorescein ophthalmic strip 1 strip (1 strip Right Eye Given 10/12/17 1052)     Initial Impression / Assessment and Plan / ED Course  I have reviewed the triage vital signs and the nursing notes.  Pertinent labs & imaging results that were available during my care of the patient were reviewed by me and considered in my medical decision  making (see chart for details).     51yo female presents with concern for right eye pain after accidentally placing essential oil drop in her right eye.  Discussed with poison control, who reports it is irritating but not caustic. They recommend irrigation for comfort and oil removal. Irrigated with 500cc, pt reports improvement but continued irritation in corner of eye. This was relieved by tetracaine.  Fluorescein staining does not show signs of abrasion or burn, however given severe irritative symptoms, feel erythromycin is reasonable.  She has allergy to azithromycin with n/v, (as well as sulfa allergy) unclear if side effect or true allergy, recommend trial of erythromycin and if concern for reaction may stop and use OTC moisturizing ointment. Patient discharged in stable condition with understanding of reasons to return.   Final Clinical Impressions(s) / ED Diagnoses   Final diagnoses:  Chemical conjunctivitis of right eye    ED Discharge Orders        Ordered    erythromycin ophthalmic ointment     10/12/17 1126       Gareth Morgan, MD 10/12/17 2232

## 2017-10-12 NOTE — Discharge Instructions (Signed)
I have given you a prescription for erythromycin ointment. You have had nausea and vomiting with azithromycin by mouth in the past, and this medication may be related. If you develop any reaction, discontinue the ointment and switch to an over the counter eye moisturizing ointment (I.e. Soothe)

## 2017-10-12 NOTE — Telephone Encounter (Signed)
Woke up about 1:00am and accidentally put essential oil  Into her right eye thinking it was her eyedrops for dry eyes.   She flushed it out with water and warm saline solution (after looking on internet).   C/o the right eye still burning and being very watery.   Her right nostril is also very stuffy. I advised her to go to the ED to have the eye evaluated and maybe further flushed.  She stated she would go now to [facility] ED.   Reason for Disposition . [1] Chemical has been washed off > 30 minutes ago AND [2] still painful  Answer Assessment - Initial Assessment Questions 1. ONSET: "When did it happen?" If happened < 10 minutes ago, ask: "Did you wash off the chemical?" If not, give First Aid Advice immediately.      I put essential oil in my eye about 1:30am by accident instead of my eye drops.   My eyes were feeling dry.   My nose is full too.  I rinsed with plain water after it happened.   I used a warm saline solution that.  I did this in my right eye. 2. CHEMICAL: "What is the name of the substance?"     Lavender  essential oil with Marginam 3. LOCATION: "Where is the burn located?"      Right eye 4. BURN SIZE: "How large is the burn?"  The palm is roughly 1% of the total body surface area (BSA).      5. SEVERITY OF THE BURN: "Is there redness?", "Are there any blisters?"     Very red and watery 6. MECHANISM: "Tell me how it happened."     I woke up in the night and by accident put essential oil into my eye instead of my eye drops for dry eyes. 7. PAIN: "Are you having any pain?" "How bad is the pain?" (Scale 1-10; or mild, moderate, severe)   - MILD (1-3): doesn't interfere with normal activities    - MODERATE (4-7): interferes with normal activities or awakens from sleep    - SEVERE (8-10): excruciating pain, unable to do any normal activities      Burning 8. OTHER SYMPTOMS: "Do you have any other symptoms?"     Right nostril stopped up.  Sensitive to light.  No  headache 9. PREGNANCY: "Is there any chance you are pregnant?" "When was your last menstrual period?"     Tubes tied.  Protocols used: BURNS - [place]

## 2017-10-12 NOTE — ED Notes (Signed)
ED Provider at bedside. 

## 2017-10-13 DIAGNOSIS — T2611XA Burn of cornea and conjunctival sac, right eye, initial encounter: Secondary | ICD-10-CM | POA: Diagnosis not present

## 2017-10-18 ENCOUNTER — Ambulatory Visit (INDEPENDENT_AMBULATORY_CARE_PROVIDER_SITE_OTHER): Payer: 59 | Admitting: Family Medicine

## 2017-10-18 ENCOUNTER — Other Ambulatory Visit: Payer: Self-pay

## 2017-10-18 ENCOUNTER — Encounter: Payer: Self-pay | Admitting: Family Medicine

## 2017-10-18 VITALS — BP 120/72 | HR 83 | Temp 98.3°F | Resp 16 | Ht 65.35 in | Wt 161.0 lb

## 2017-10-18 DIAGNOSIS — Z23 Encounter for immunization: Secondary | ICD-10-CM

## 2017-10-18 DIAGNOSIS — I10 Essential (primary) hypertension: Secondary | ICD-10-CM

## 2017-10-18 DIAGNOSIS — F3163 Bipolar disorder, current episode mixed, severe, without psychotic features: Secondary | ICD-10-CM | POA: Diagnosis not present

## 2017-10-18 DIAGNOSIS — Z13220 Encounter for screening for lipoid disorders: Secondary | ICD-10-CM

## 2017-10-18 DIAGNOSIS — F1021 Alcohol dependence, in remission: Secondary | ICD-10-CM | POA: Diagnosis not present

## 2017-10-18 DIAGNOSIS — Z0000 Encounter for general adult medical examination without abnormal findings: Secondary | ICD-10-CM | POA: Diagnosis not present

## 2017-10-18 DIAGNOSIS — F4310 Post-traumatic stress disorder, unspecified: Secondary | ICD-10-CM | POA: Diagnosis not present

## 2017-10-18 DIAGNOSIS — Z131 Encounter for screening for diabetes mellitus: Secondary | ICD-10-CM | POA: Diagnosis not present

## 2017-10-18 LAB — POCT URINALYSIS DIP (MANUAL ENTRY)
BILIRUBIN UA: NEGATIVE
BILIRUBIN UA: NEGATIVE mg/dL
Blood, UA: NEGATIVE
Glucose, UA: NEGATIVE mg/dL
LEUKOCYTES UA: NEGATIVE
Nitrite, UA: NEGATIVE
PROTEIN UA: NEGATIVE mg/dL
Spec Grav, UA: 1.01 (ref 1.010–1.025)
Urobilinogen, UA: 0.2 E.U./dL
pH, UA: 7 (ref 5.0–8.0)

## 2017-10-18 MED ORDER — LISINOPRIL 10 MG PO TABS: 10.0000 mg | ORAL_TABLET | Freq: Every day | ORAL | 5 refills | Status: DC

## 2017-10-18 NOTE — Patient Instructions (Addendum)
IF you received an x-ray today, you will receive an invoice from [facility]. Please contact [facility] at [phone] with questions or concerns regarding your invoice.   IF you received labwork today, you will receive an invoice from [facility]. Please contact [facility] at [phone] with questions or concerns regarding your invoice.   Our billing staff will not be able to assist you with questions regarding bills from these companies.  You will be contacted with the lab results as soon as they are available. The fastest way to get your results is to activate your My Chart account. Instructions are located on the last page of this paperwork. If you have not heard from [place] regarding the results in 2 weeks, please contact this office.      Preventive Care 40-64 Years, Female Preventive care refers to lifestyle choices and visits with your health care provider that can promote health and wellness. What does preventive care include?  A yearly physical exam. This is also called an annual well check.  Dental exams once or twice a year.  Routine eye exams. Ask your health care provider how often you should have your eyes checked.  Personal lifestyle choices, including: ? Daily care of your teeth and gums. ? Regular physical activity. ? Eating a healthy diet. ? Avoiding tobacco and drug use. ? Limiting alcohol use. ? Practicing safe sex. ? Taking low-dose aspirin daily starting at age 34. ? Taking vitamin and mineral supplements as recommended by your health care provider. What happens during an annual well check? The services and screenings done by your health care provider during your annual well check will depend on your age, overall health, lifestyle risk factors, and family history of disease. Counseling Your health care provider may ask you questions about your:  Alcohol use.  Tobacco use.  Drug use.  Emotional well-being.  Home and relationship  well-being.  Sexual activity.  Eating habits.  Work and work Statistician.  Method of birth control.  Menstrual cycle.  Pregnancy history.  Screening You may have the following tests or measurements:  Height, weight, and BMI.  Blood pressure.  Lipid and cholesterol levels. These may be checked every 5 years, or more frequently if you are over 65 years old.  Skin check.  Lung cancer screening. You may have this screening every year starting at age 102 if you have a 30-pack-year history of smoking and currently smoke or have quit within the past 15 years.  Fecal occult blood test (FOBT) of the stool. You may have this test every year starting at age 33.  Flexible sigmoidoscopy or colonoscopy. You may have a sigmoidoscopy every 5 years or a colonoscopy every 10 years starting at age 23.  Hepatitis C blood test.  Hepatitis B blood test.  Sexually transmitted disease (STD) testing.  Diabetes screening. This is done by checking your blood sugar (glucose) after you have not eaten for a while (fasting). You may have this done every 1-3 years.  Mammogram. This may be done every 1-2 years. Talk to your health care provider about when you should start having regular mammograms. This may depend on whether you have a family history of breast cancer.  BRCA-related cancer screening. This may be done if you have a family history of breast, ovarian, tubal, or peritoneal cancers.  Pelvic exam and Pap test. This may be done every 3 years starting at age 3. Starting at age 55, this may be done every 5 years if  you have a Pap test in combination with an HPV test.  Bone density scan. This is done to screen for osteoporosis. You may have this scan if you are at high risk for osteoporosis.  Discuss your test results, treatment options, and if necessary, the need for more tests with your health care provider. Vaccines Your health care provider may recommend certain vaccines, such  as:  Influenza vaccine. This is recommended every year.  Tetanus, diphtheria, and acellular pertussis (Tdap, Td) vaccine. You may need a Td booster every 10 years.  Varicella vaccine. You may need this if you have not been vaccinated.  Zoster vaccine. You may need this after age 70.  Measles, mumps, and rubella (MMR) vaccine. You may need at least one dose of MMR if you were born in 1957 or later. You may also need a second dose.  Pneumococcal 13-valent conjugate (PCV13) vaccine. You may need this if you have certain conditions and were not previously vaccinated.  Pneumococcal polysaccharide (PPSV23) vaccine. You may need one or two doses if you smoke cigarettes or if you have certain conditions.  Meningococcal vaccine. You may need this if you have certain conditions.  Hepatitis A vaccine. You may need this if you have certain conditions or if you travel or work in places where you may be exposed to hepatitis A.  Hepatitis B vaccine. You may need this if you have certain conditions or if you travel or work in places where you may be exposed to hepatitis B.  Haemophilus influenzae type b (Hib) vaccine. You may need this if you have certain conditions.  Talk to your health care provider about which screenings and vaccines you need and how often you need them. This information is not intended to replace advice given to you by your health care provider. Make sure you discuss any questions you have with your health care provider. Document Released: 11/27/2015 Document Revised: 07/20/2016 Document Reviewed: 09/01/2015 Elsevier Interactive Patient Education  2017 [employer].

## 2017-10-18 NOTE — Progress Notes (Signed)
Subjective:    Patient ID: Nancy Bradshaw, female    DOB: [birth year], 51 y.o.   MRN: [id number]  10/18/2017  Annual Exam    HPI This 51 y.o. female presents for Complete Physical Examination.  Last physical:  08-10-2016 Pap smear:  08-10-2016 WNL HPV negative Mammogram:  2014; no family history of breast cancer Eye exam: last week; essential oils in eye; 2015; readers Dental exam:  Not recently.    Visual Acuity Screening   Right eye Left eye Both eyes  Without correction:     With correction: 20/20 20/30 20/20     BP Readings from Last 3 Encounters:  10/18/17 120/72  10/12/17 136/80  04/05/17 111/70   Wt Readings from Last 3 Encounters:  10/18/17 161 lb (73 kg)  04/05/17 168 lb 3.2 oz (76.3 kg)  08/10/16 169 lb 3.2 oz (76.7 kg)   Immunization History  Administered Date(s) Administered  . Hepatitis A, Adult 03/30/2014, 08/10/2016  . Influenza,inj,Quad PF,6+ Mos 08/18/2014, 09/21/2015, 08/10/2016  . Influenza-Unspecified 08/14/2017  . Pneumococcal Polysaccharide-23 10/15/2014  . Td 07/17/2002  . Tdap 07/11/2008    Review of Systems  Constitutional: Negative for activity change, appetite change, chills, diaphoresis, fatigue, fever and unexpected weight change.  HENT: Negative for congestion, dental problem, drooling, ear discharge, ear pain, facial swelling, hearing loss, mouth sores, nosebleeds, postnasal drip, rhinorrhea, sinus pressure, sneezing, sore throat, tinnitus, trouble swallowing and voice change.   Eyes: Negative for photophobia, pain, discharge, redness, itching and visual disturbance.  Respiratory: Negative for apnea, cough, choking, chest tightness, shortness of breath, wheezing and stridor.   Cardiovascular: Negative for chest pain, palpitations and leg swelling.  Gastrointestinal: Negative for abdominal distention, abdominal pain, anal bleeding, blood in stool, constipation, diarrhea, nausea, rectal pain and vomiting.  Endocrine: Negative for cold  intolerance, heat intolerance, polydipsia, polyphagia and polyuria.  Genitourinary: Negative for decreased urine volume, difficulty urinating, dyspareunia, dysuria, enuresis, flank pain, frequency, genital sores, hematuria, menstrual problem, pelvic pain, urgency, vaginal bleeding, vaginal discharge and vaginal pain.       Nocturia x 1.  No urinary leakage.  Musculoskeletal: Negative for arthralgias, back pain, gait problem, joint swelling, myalgias, neck pain and neck stiffness.  Skin: Negative for color change, pallor, rash and wound.  Allergic/Immunologic: Negative for environmental allergies, food allergies and immunocompromised state.  Neurological: Negative for dizziness, tremors, seizures, syncope, facial asymmetry, speech difficulty, weakness, light-headedness, numbness and headaches.  Hematological: Negative for adenopathy. Does not bruise/bleed easily.  Psychiatric/Behavioral: Negative for agitation, behavioral problems, confusion, decreased concentration, dysphoric mood, hallucinations, self-injury, sleep disturbance and suicidal ideas. The patient is not nervous/anxious and is not hyperactive.        Bedtime 8:30; wakes up 5:00.    Past Medical History:  Diagnosis Date  . Alcohol abuse   . Allergy   . Anxiety Dx 2006  . Bipolar 2 disorder [facility]) Dx 2008   [facility] 2011-2017  . Heart murmur    Dx at age of 73  . Hx of dysmenorrhea 07/20/2012   Seasonale prescribed for severe dysmenorrhea and heavy bleeding with clotting; on OCP, menses lasts 4 days with light bleeding.   Marland Kitchen Hyperlipidemia    Dx at age of 38  . Hypertension Dx 2009  . PTSD (post-traumatic stress disorder) Dx 2008  . Substance abuse ([place])    No alcohol since 2008   Past Surgical History:  Procedure Laterality Date  . APPENDECTOMY  2012  . ENDOMETRIAL ABLATION  02/2014   . TUBAL  LIGATION  02/2014    Allergies  Allergen Reactions  . Azithromycin Nausea And Vomiting  . Codeine Nausea And Vomiting  .  Other     Pt prefers not to have pain medication if not necessary- recovering alcoholic.   Ebbie Ridge [Pseudoephedrine Hcl]     "Creepy crawly skin"--if taken consistently  . Zoloft [Sertraline Hcl]     Due to bipolar disorder-makes her manic.   Marland Kitchen Penicillins Rash    Has patient had a PCN reaction causing immediate rash, facial/tongue/throat swelling, SOB or lightheadedness with hypotension: No Has patient had a PCN reaction causing severe rash involving mucus membranes or skin necrosis: No Has patient had a PCN reaction that required hospitalization: No Has patient had a PCN reaction occurring within the last 10 years: No If all of the above answers are "NO", then may proceed with Cephalosporin use.   . Sulfa Antibiotics Rash   Current Outpatient Medications on File Prior to Visit  Medication Sig Dispense Refill  . erythromycin ophthalmic ointment Place a 1/2 inch ribbon of ointment into the lower eyelid four times daily for 7 days. 1 g 0  . lamoTRIgine (LAMICTAL) 200 MG tablet Take 1.5 tablets (300 mg total) by mouth daily. 45 tablet 2  . LYSINE PO Take 1 tablet by mouth daily.    . mirtazapine (REMERON) 45 MG tablet Take 1 tablet (45 mg total) by mouth at bedtime. 30 tablet 2  . Multiple Vitamin (MULTIVITAMIN) tablet Take 1 tablet by mouth daily.    . NON FORMULARY Take 1 tablet by mouth daily. "LAJ"    . polyvinyl alcohol (LIQUIFILM TEARS) 1.4 % ophthalmic solution Place 1 drop into both eyes daily.    . Red Yeast Rice Extract (RED YEAST RICE PO) Take 1 tablet by mouth daily.    . ziprasidone (GEODON) 80 MG capsule Take 1 capsule (80 mg total) by mouth daily. 30 capsule 2   No current facility-administered medications on file prior to visit.    Social History   Socioeconomic History  . Marital status: Divorced    Spouse name: Not on file  . Number of children: 1   . Years of education: grad schoo  . Highest education level: Not on file  Social Needs  . Financial resource  strain: Not on file  . Food insecurity - worry: Not on file  . Food insecurity - inability: Not on file  . Transportation needs - medical: Not on file  . Transportation needs - non-medical: Not on file  Occupational History  . Not on file  Tobacco Use  . Smoking status: Former Smoker    Last attempt to quit: 11/14/2001    Years since quitting: 15.9  . Smokeless tobacco: Never Used  Substance and Sexual Activity  . Alcohol use: No    Alcohol/week: 0.0 oz    Comment: In Recovery  since 2006  . Drug use: No  . Sexual activity: Not Currently    Birth control/protection: Surgical  Other Topics Concern  . Not on file  Social History Narrative   Social Hx:   Current living situation: lives in [place] with mother and daughter   Born and Raised in [place] by both parents. 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   Kids: 1 daughter  (15yo)   Legal issues: denies   recovery since 2006.   Alcoholism age [age]; binge drinking; benzo abuse stopped in 2008. Pt goes to AA 3x/week      Drugs: none; previous marijuna use about 5 times in her life time      Tobacco: quit smoking in 2002; smoked x 25 years      Exercise: no formal exercise in 2018.     Family History  Problem Relation Age of Onset  . Hypertension Father   . Hyperlipidemia Father   . Cancer Father 18       CLL  . Anxiety disorder Father   . Depression Father   . Heart disease Father 20       CHF  . Hypertension Maternal Grandmother   . Hypothyroidism Maternal Grandmother        and great aunts   . Cancer Maternal Grandmother 74       on spinal cord, rare   . Hypertension Maternal Grandfather   . Bipolar disorder Paternal Grandmother     . Hypertension Paternal Grandmother   . Hyperlipidemia Paternal Grandmother   . Heart disease Paternal Grandmother   . Chronic fatigue Mother   . ADD / ADHD Daughter   . Anxiety disorder Daughter   . Depression Daughter        Objective:    BP 120/72   Pulse 83   Temp 98.3 F (36.8 C) (Oral)   Resp 16   Ht 5' 5.35" (1.66 m)   Wt 161 lb (73 kg)   SpO2 99%   BMI 26.50 kg/m  Physical Exam  Constitutional: She is oriented to person, place, and time. She appears well-developed and well-nourished. No distress.  HENT:  Head: Normocephalic and atraumatic.  Right Ear: External ear normal.  Left Ear: External ear normal.  Nose: Nose normal.  Mouth/Throat: Oropharynx is clear and moist.  Eyes: Conjunctivae and EOM are normal. Pupils are equal, round, and reactive to light.  Neck: Normal range of motion and full passive range of motion without pain. Neck supple. No JVD present. Carotid bruit is not present. No thyromegaly present.  Cardiovascular: Normal rate, regular rhythm and normal heart sounds. Exam reveals no gallop and no friction rub.  No murmur heard. Pulmonary/Chest: Effort normal and breath sounds normal. She has no wheezes. She has no rales. Right breast exhibits no inverted nipple, no mass, no nipple discharge, no skin change and no tenderness. Left breast exhibits no inverted nipple, no mass, no nipple discharge, no skin change and no tenderness. Breasts are symmetrical.  Abdominal: Soft. Bowel sounds are normal. She exhibits no distension and no mass. There is no tenderness. There is no rebound and no guarding.  Musculoskeletal:       Right shoulder: Normal.       Left shoulder: Normal.       Cervical back: Normal.  Lymphadenopathy:    She has no cervical adenopathy.  Neurological: She is alert and oriented to person, place, and time. She has normal reflexes. No cranial nerve deficit. She exhibits normal muscle tone. Coordination normal.  Skin: Skin is warm and dry. No  rash noted. She is not diaphoretic. No erythema. No pallor.  Psychiatric: She has a normal mood and affect. Her behavior is normal. Judgment and thought content normal.  Nursing note and vitals reviewed.  No results found. Depression  screen [facility] 2/9 10/18/2017 04/05/2017 08/10/2016 10/28/2015 10/15/2014  Decreased Interest 0 0 0 0 0  Down, Depressed, Hopeless 0 0 0 0 0  PHQ - 2 Score 0 0 0 0 0   Fall Risk  10/18/2017 04/05/2017 08/10/2016 10/15/2014  Falls in the past year? No No No No        Assessment & Plan:   1. Routine physical examination   2. Essential hypertension   3. Screening for diabetes mellitus   4. Screening, lipid   5. Need for prophylactic vaccination and inoculation against influenza   6. Bipolar disorder, current episode mixed, severe, without psychotic features ([place])   7. PTSD (post-traumatic stress disorder)   8. Personal history of alcoholism ([place])     -anticipatory guidance provided --- exercise, weight loss, safe driving practices, aspirin 81mg  daily. -obtain age appropriate screening labs and labs for chronic disease management. -refill of Lisinopril provided.   Orders Placed This Encounter  Procedures  . CBC with Differential/Platelet  . Comprehensive metabolic panel    Order Specific Question:   Has the patient fasted?    Answer:   No  . Hemoglobin A1c  . Lipid panel    Order Specific Question:   Has the patient fasted?    Answer:   No  . TSH  . Prolactin  . POCT urinalysis dipstick  . EKG 12-Lead   Meds ordered this encounter  Medications  . lisinopril (PRINIVIL,ZESTRIL) 10 MG tablet    Sig: Take 1 tablet (10 mg total) by mouth daily.    Dispense:  30 tablet    Refill:  5    Return in about 6 months (around 04/18/2018) for follow-up chronic medical conditions.   Madalyne Husk Elayne Guerin, M.D. Primary Care at [facility] previously [facility] [address] [place], [place]  [address] [phone] phone [phone] fax

## 2017-10-19 ENCOUNTER — Ambulatory Visit (HOSPITAL_COMMUNITY): Payer: 59 | Admitting: Psychiatry

## 2017-10-19 ENCOUNTER — Encounter (HOSPITAL_COMMUNITY): Payer: Self-pay | Admitting: Psychiatry

## 2017-10-19 DIAGNOSIS — Z79899 Other long term (current) drug therapy: Secondary | ICD-10-CM | POA: Diagnosis not present

## 2017-10-19 DIAGNOSIS — F4310 Post-traumatic stress disorder, unspecified: Secondary | ICD-10-CM | POA: Diagnosis not present

## 2017-10-19 DIAGNOSIS — F411 Generalized anxiety disorder: Secondary | ICD-10-CM | POA: Diagnosis not present

## 2017-10-19 DIAGNOSIS — F3181 Bipolar II disorder: Secondary | ICD-10-CM | POA: Diagnosis not present

## 2017-10-19 DIAGNOSIS — Z818 Family history of other mental and behavioral disorders: Secondary | ICD-10-CM

## 2017-10-19 DIAGNOSIS — Z87891 Personal history of nicotine dependence: Secondary | ICD-10-CM | POA: Diagnosis not present

## 2017-10-19 DIAGNOSIS — F1021 Alcohol dependence, in remission: Secondary | ICD-10-CM

## 2017-10-19 LAB — CBC WITH DIFFERENTIAL/PLATELET
BASOS ABS: 0 10*3/uL (ref 0.0–0.2)
Basos: 1 %
EOS (ABSOLUTE): 0.1 10*3/uL (ref 0.0–0.4)
Eos: 2 %
HEMOGLOBIN: 13.6 g/dL (ref 11.1–15.9)
Hematocrit: 39.9 % (ref 34.0–46.6)
IMMATURE GRANULOCYTES: 0 %
Immature Grans (Abs): 0 10*3/uL (ref 0.0–0.1)
LYMPHS ABS: 1.7 10*3/uL (ref 0.7–3.1)
Lymphs: 30 %
MCH: 30.3 pg (ref 26.6–33.0)
MCHC: 34.1 g/dL (ref 31.5–35.7)
MCV: 89 fL (ref 79–97)
MONOCYTES: 7 %
Monocytes Absolute: 0.4 10*3/uL (ref 0.1–0.9)
NEUTROS PCT: 60 %
Neutrophils Absolute: 3.4 10*3/uL (ref 1.4–7.0)
Platelets: 267 10*3/uL (ref 150–379)
RBC: 4.49 x10E6/uL (ref 3.77–5.28)
RDW: 13.9 % (ref 12.3–15.4)
WBC: 5.5 10*3/uL (ref 3.4–10.8)

## 2017-10-19 LAB — COMPREHENSIVE METABOLIC PANEL
ALBUMIN: 5.1 g/dL (ref 3.5–5.5)
ALK PHOS: 68 IU/L (ref 39–117)
ALT: 20 IU/L (ref 0–32)
AST: 24 IU/L (ref 0–40)
Albumin/Globulin Ratio: 2.7 — ABNORMAL HIGH (ref 1.2–2.2)
BUN/Creatinine Ratio: 13 (ref 9–23)
BUN: 14 mg/dL (ref 6–24)
Bilirubin Total: 0.5 mg/dL (ref 0.0–1.2)
CO2: 25 mmol/L (ref 20–29)
CREATININE: 1.04 mg/dL — AB (ref 0.57–1.00)
Calcium: 9.6 mg/dL (ref 8.7–10.2)
Chloride: 98 mmol/L (ref 96–106)
GFR calc Af Amer: 73 mL/min/{1.73_m2} (ref >59)
GFR calc non Af Amer: 63 mL/min/{1.73_m2} (ref >59)
GLUCOSE: 82 mg/dL (ref 65–99)
Globulin, Total: 1.9 g/dL (ref 1.5–4.5)
Potassium: 4.3 mmol/L (ref 3.5–5.2)
Sodium: 139 mmol/L (ref 134–144)
Total Protein: 7 g/dL (ref 6.0–8.5)

## 2017-10-19 LAB — TSH: TSH: 1.28 u[IU]/mL (ref 0.450–4.500)

## 2017-10-19 LAB — LIPID PANEL
Chol/HDL Ratio: 4.4 ratio (ref 0.0–4.4)
Cholesterol, Total: 328 mg/dL — ABNORMAL HIGH (ref 100–199)
HDL: 74 mg/dL (ref >39)
LDL Calculated: 236 mg/dL — ABNORMAL HIGH (ref 0–99)
TRIGLYCERIDES: 90 mg/dL (ref 0–149)
VLDL Cholesterol Cal: 18 mg/dL (ref 5–40)

## 2017-10-19 LAB — HEMOGLOBIN A1C
ESTIMATED AVERAGE GLUCOSE: 108 mg/dL
HEMOGLOBIN A1C: 5.4 % (ref 4.8–5.6)

## 2017-10-19 LAB — PROLACTIN: Prolactin: 11.4 ng/mL (ref 4.8–23.3)

## 2017-10-19 MED ORDER — LAMOTRIGINE 200 MG PO TABS: 300.0000 mg | ORAL_TABLET | Freq: Every day | ORAL | 3 refills | Status: DC

## 2017-10-19 MED ORDER — ZIPRASIDONE HCL 80 MG PO CAPS: 80.0000 mg | ORAL_CAPSULE | Freq: Every day | ORAL | 3 refills | Status: DC

## 2017-10-19 MED ORDER — MIRTAZAPINE 45 MG PO TABS: 45.0000 mg | ORAL_TABLET | Freq: Every day | ORAL | 3 refills | Status: DC

## 2017-10-19 NOTE — Progress Notes (Signed)
BH MD/PA/NP OP Progress Note  10/19/2017 4:27 PM Nancy Bradshaw  MRN:  [id number]  Chief Complaint:  Chief Complaint    Follow-up; Depression     HPI: "I'm fine". Pt denies depression, hopelessness and worthlessness. She thinks work greatly improves her mood. She typically struggles in the wintertime but the past winters have been better. Sleep is fair. Energy is fair. Pt denies SI/HI.  Pt denies manic and hypomanic symptoms including periods of decreased need for sleep, increased energy, mood lability, impulsivity, FOI, and excessive spending.  Pt denies nightmares, intrusive memories or HV. She states no issues for the last 8 yrs.  Pt does avoids crowds.   Pt states-taking meds as prescribed and denies SE.   Visit Diagnosis:    ICD-10-CM   1. Bipolar II disorder in full remission ([place]) F31.81   2. PTSD (post-traumatic stress disorder) F43.10   3. GAD (generalized anxiety disorder) F41.1       Past Psychiatric History:  Dx:  Post partum depression after first daughter was born in 2003. Bipolar II in 2005; PTSD dx in 2008; Alcohol use disorder in remission Meds: Zoloft- took it for 3 days and it causes severe suicidal ideations; Paxil- caused hypomania; Ativan; Xanax; Lamictal Previous psychiatrist/therapist: Dr. Paula Libra; prior to that Dr. Debbe Bales Hospitalizations: [facility] in 2004 after starting Zoloft for 4 days SIB: denies Suicide attempts: denies but does have hx of SI, last time seriously was in 2008 Hx of violent behavior towards others: denies Current access to guns: denies Hx of abuse: yes see PTSD [employer] Hx: denies Hx of Seizures: denies Hx of TBI: denies     Previous Psychotropic Medications: Yes    Substance Abuse History in the last 12 months:  No.   Consequences of Substance Abuse: Medical Consequences:  Pt has been sober from alcohol since 2006 and benzos since 2008. Reports she goes to [place] meeting 3x/week and has sponser. Pt is sponsering 2 women  right now. Pt denies any hx of DUI/DWI and detox/rehab. Pt reports she was binge drinker and would often black out.  Legal Consequences:  denies Family Consequences:  broken relationships Blackouts:  yes DT's: denies Withdrawal Symptoms:   Nausea Tremors Vomiting   Past Medical History:  Past Medical History:  Diagnosis Date  . Alcohol abuse   . Allergy   . Anxiety Dx 2006  . Bipolar 2 disorder [facility]) Dx 2008   [facility] 2011-2017  . Heart murmur    Dx at age of 62  . Hx of dysmenorrhea 07/20/2012   Seasonale prescribed for severe dysmenorrhea and heavy bleeding with clotting; on OCP, menses lasts 4 days with light bleeding.   Marland Kitchen Hyperlipidemia    Dx at age of 65  . Hypertension Dx 2009  . PTSD (post-traumatic stress disorder) Dx 2008  . Substance abuse ([place])    No alcohol since 2008    Past Surgical History:  Procedure Laterality Date  . APPENDECTOMY  2012  . ENDOMETRIAL ABLATION  02/2014   . TUBAL LIGATION  02/2014     Family Psychiatric History:  Family History  Problem Relation Age of Onset  . Hypertension Father   . Hyperlipidemia Father   . Cancer Father 38       CLL  . Anxiety disorder Father   . Depression Father   . Heart disease Father 45       CHF  . Hypertension Maternal Grandmother   . Hypothyroidism Maternal Grandmother  and great aunts   . Cancer Maternal Grandmother 74       on spinal cord, rare   . Hypertension Maternal Grandfather   . Bipolar disorder Paternal Grandmother   . Hypertension Paternal Grandmother   . Hyperlipidemia Paternal Grandmother   . Heart disease Paternal Grandmother   . Chronic fatigue Mother   . ADD / ADHD Daughter   . Anxiety disorder Daughter   . Depression Daughter     Social History:  Social History   Socioeconomic History  . Marital status: Divorced    Spouse name: None  . Number of children: 1   . Years of education: grad schoo  . Highest education level: None  Social Needs  . Financial  resource strain: None  . Food insecurity - worry: None  . Food insecurity - inability: None  . Transportation needs - medical: None  . Transportation needs - non-medical: None  Occupational History  . None  Tobacco Use  . Smoking status: Former Smoker    Last attempt to quit: 11/14/2001    Years since quitting: 15.9  . Smokeless tobacco: Never Used  Substance and Sexual Activity  . Alcohol use: No    Alcohol/week: 0.0 oz    Comment: In Recovery  since 2006  . Drug use: No  . Sexual activity: Not Currently    Birth control/protection: Surgical  Other Topics Concern  . None  Social History Narrative   Social Hx:   Current living situation: lives in [place] with mother and daughter   Born and Raised in [place] by both parents. 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   Kids: 1 daughter  (15yo)   Legal issues: denies   recovery since 2006.   Alcoholism age [age]; binge drinking; benzo abuse stopped in 2008. Pt goes to AA 3x/week      Drugs: none; previous marijuna use about 5 times in her life time      Tobacco: quit smoking in 2002; smoked x 25 years      Exercise: no formal exercise in 2018.      Allergies:  Allergies  Allergen Reactions  . Azithromycin Nausea And Vomiting  . Codeine Nausea And Vomiting  . Other     Pt prefers not to have pain medication if not necessary- recovering alcoholic.   Ebbie Ridge [Pseudoephedrine Hcl]     "Creepy crawly skin"--if taken consistently  . Zoloft [Sertraline Hcl]     Due to bipolar disorder-makes her manic.   Marland Kitchen Penicillins Rash    Has patient had a PCN reaction causing immediate rash, facial/tongue/throat swelling, SOB or lightheadedness with  hypotension: No Has patient had a PCN reaction causing severe rash involving mucus membranes or skin necrosis: No Has patient had a PCN reaction that required hospitalization: No Has patient had a PCN reaction occurring within the last 10 years: No If all of the above answers are "NO", then may proceed with Cephalosporin use.   . Sulfa Antibiotics Rash    Metabolic Disorder Labs: Lab Results  Component Value Date   HGBA1C 5.4 10/18/2017   MPG 100 08/10/2016   Lab Results  Component Value Date   PROLACTIN  11.4 10/18/2017   Lab Results  Component Value Date   CHOL 328 (H) 10/18/2017   TRIG 90 10/18/2017   HDL 74 10/18/2017   CHOLHDL 4.4 10/18/2017   VLDL 22 08/10/2016   LDLCALC 236 (H) 10/18/2017   LDLCALC 227 (H) 08/10/2016   Lab Results  Component Value Date   TSH 1.280 10/18/2017   TSH 1.28 08/10/2016    Therapeutic Level Labs: No results found for: LITHIUM No results found for: VALPROATE No components found for:  CBMZ  Current Medications: Current Outpatient Medications  Medication Sig Dispense Refill  . lamoTRIgine (LAMICTAL) 200 MG tablet Take 1.5 tablets (300 mg total) by mouth daily. 45 tablet 2  . lisinopril (PRINIVIL,ZESTRIL) 10 MG tablet Take 1 tablet (10 mg total) by mouth daily. 30 tablet 5  . LYSINE PO Take 1 tablet by mouth daily.    . mirtazapine (REMERON) 45 MG tablet Take 1 tablet (45 mg total) by mouth at bedtime. 30 tablet 2  . Multiple Vitamin (MULTIVITAMIN) tablet Take 1 tablet by mouth daily.    . polyvinyl alcohol (LIQUIFILM TEARS) 1.4 % ophthalmic solution Place 1 drop into both eyes daily.    . Red Yeast Rice Extract (RED YEAST RICE PO) Take 1 tablet by mouth daily.    . ziprasidone (GEODON) 80 MG capsule Take 1 capsule (80 mg total) by mouth daily. 30 capsule 2  . NON FORMULARY Take 1 tablet by mouth daily. "LAJ"     No current facility-administered medications for this visit.      Musculoskeletal: Strength & Muscle Tone: within  normal limits Gait & Station: normal Patient leans: N/A  Psychiatric Specialty Exam: Review of Systems  Constitutional: Negative for chills and fever.  HENT: Negative for congestion, ear pain, nosebleeds, sinus pain and sore throat.   Neurological: Negative for weakness.    There were no vitals taken for this visit.There is no height or weight on file to calculate BMI.  General Appearance: Fairly Groomed  Eye Contact:  Good  Speech:  Clear and Coherent and Normal Rate  Volume:  Normal  Mood:  Euthymic  Affect:  Appropriate and Full Range  Thought Process:  Goal Directed and Descriptions of Associations: Intact  Orientation:  Full (Time, Place, and Person)  Thought Content: Logical   Suicidal Thoughts:  No  Homicidal Thoughts:  No  Memory:  Immediate;   Good Recent;   Good Remote;   Good  Judgement:  Good  Insight:  Good  Psychomotor Activity:  Normal  Concentration:  Concentration: Good and Attention Span: Good  Recall:  Good  Fund of Knowledge: Good  Language: Good  Akathisia:  No  Handed:  Right  AIMS (if indicated): not done  Assets:  Communication Skills Desire for Improvement Housing Transportation Vocational/Educational  ADL's:  Intact  Cognition: WNL  Sleep:  Good   Screenings: PHQ2-9     Office Visit from 10/18/2017 in Primary Care at [place] from 04/05/2017 in Primary Care at [place] from 08/10/2016 in Primary Care at [place] from 10/28/2015 in [place] Office Visit from 10/15/2014 in [place]  PHQ-2 Total Score  0  0  0  0  0       Assessment and Plan: Bipolar II d/o; PTSD; GAD; Alcohol use disorder in remission    Medication management with supportive therapy. Risks/benefits and SE of the medication discussed. Pt verbalized understanding and verbal consent  obtained for treatment.  Affirm with the patient that the medications are taken as ordered. Patient  expressed understanding of how their medications were to be used.   Meds:  Geodon 80mg  po qD for Bipolar disorder Remeron 45mg  po q D for mood and anxiety Lamictal 300mg  po qD for Bipolar disorder  Labs: LDL and chol elevated, CMP WNL, CBC WNL, TSH WNL, Prolactin and HbA1c WNL  Therapy: brief supportive therapy provided. Discussed psychosocial stressors in detail.     Consultations: Encouraged to follow up with therapist Encouraged to follow up with PCP as needed  Pt denies SI and is at an acute low risk for suicide. Patient told to call clinic if any problems occur. Patient advised to go to ER if they should develop SI/HI, side effects, or if symptoms worsen. Has crisis numbers to call if needed. Pt verbalized understanding.  F/up in 4 months or sooner if needed    Charlcie Cradle, MD 10/19/2017, 4:27 PM

## 2018-02-15 ENCOUNTER — Ambulatory Visit (HOSPITAL_COMMUNITY): Payer: 59 | Admitting: Psychiatry

## 2018-02-15 ENCOUNTER — Encounter (HOSPITAL_COMMUNITY): Payer: Self-pay | Admitting: Psychiatry

## 2018-02-15 DIAGNOSIS — Z818 Family history of other mental and behavioral disorders: Secondary | ICD-10-CM | POA: Diagnosis not present

## 2018-02-15 DIAGNOSIS — F3181 Bipolar II disorder: Secondary | ICD-10-CM

## 2018-02-15 DIAGNOSIS — F411 Generalized anxiety disorder: Secondary | ICD-10-CM

## 2018-02-15 DIAGNOSIS — F4310 Post-traumatic stress disorder, unspecified: Secondary | ICD-10-CM | POA: Diagnosis not present

## 2018-02-15 DIAGNOSIS — Z87891 Personal history of nicotine dependence: Secondary | ICD-10-CM | POA: Diagnosis not present

## 2018-02-15 MED ORDER — LAMOTRIGINE 200 MG PO TABS: 300.0000 mg | ORAL_TABLET | Freq: Every day | ORAL | 3 refills | Status: DC

## 2018-02-15 MED ORDER — MIRTAZAPINE 45 MG PO TABS: 45.0000 mg | ORAL_TABLET | Freq: Every day | ORAL | 3 refills | Status: DC

## 2018-02-15 MED ORDER — ZIPRASIDONE HCL 80 MG PO CAPS: 80.0000 mg | ORAL_CAPSULE | Freq: Every day | ORAL | 3 refills | Status: DC

## 2018-02-15 NOTE — Progress Notes (Signed)
BH MD/PA/NP OP Progress Note  02/15/2018 4:13 PM Nancy Bradshaw  MRN:  [id number]  Chief Complaint:  Chief Complaint    Follow-up     HPI: Patient reports that she is doing really well.  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  Patient states she now has a lot of free time.  She used to enjoy camping and hiking.  She has signed up for backpacking trip on the [place] this summer with her church.  Patient is very excited.  She has been exercising and walking 2 miles a day in preparation for the trip.  She states that exercising has helped greatly improve her mood.  She denies depression, isolation, anhedonia and hopelessness.  She denies SI/HI.  She is sleeping about 8-9 hours a night.  Energy and appetite are good.  Patient states that she feels as though she is always a little hypomanic in terms of her energy.  She denies florid manic and hypomanic symptoms.  No change in sleep, impulsivity or racing thoughts.  She reports that she is productive at work and has good relationships at home.  She denies any PTSD and anxiety symptoms.  She is taking her meds as prescribed and denies any side effects  Visit Diagnosis:    ICD-10-CM   1. Bipolar II disorder in full remission ([place]) F31.81   2. PTSD (post-traumatic stress disorder) F43.10   3. GAD (generalized anxiety disorder) F41.1     Past Psychiatric History:  Dx: Post partum depression after first daughter was born in 2003. Bipolar II in 2005; PTSD dx in 2008; Alcohol use disorder in remission Meds: Zoloft- took it for 3 days and it causes severe suicidal ideations; Paxil- caused hypomania; Ativan; Xanax; Lamictal Previous psychiatrist/therapist: Dr. Paula Libra; prior to that Dr. Debbe Bales Hospitalizations: [facility] in 2004 after starting Zoloft for 4 days SIB: denies Suicide attempts: denies but does have hx of SI, last time seriously was in 2008 Hx of violent behavior towards  others: denies Current access to guns: denies Hx of abuse: yes see PTSD [employer] Hx: denies Hx of Seizures: denies Hx of TBI: denies   Previous Psychotropic Medications: Yes    Consequences of Substance Abuse: Medical Consequences: Pt has been sober from alcohol since 2006 and benzos since 2008. Reports she goes to [place] meeting 3x/week and has sponser. Pt is sponsering 2 women right now. Pt denies any hx of DUI/DWI and detox/rehab. Pt reports she was binge drinker and would often black out.  Legal Consequences: denies Family Consequences: broken relationships Blackouts: yes DT's: denies Withdrawal Symptoms: Nausea Tremors Vomiting    Past Medical History:  Past Medical History:  Diagnosis Date  . Alcohol abuse   . Allergy   . Anxiety Dx 2006  . Bipolar 2 disorder [facility]) Dx 2008   [facility] 2011-2017  . Heart murmur    Dx at age of 89  . Hx of dysmenorrhea 07/20/2012   Seasonale prescribed for severe dysmenorrhea and heavy bleeding with clotting; on OCP, menses lasts 4 days with light bleeding.   Marland Kitchen Hyperlipidemia    Dx at age of 3  . Hypertension Dx 2009  . PTSD (post-traumatic stress disorder) Dx 2008  . Substance abuse ([place])    No alcohol since 2008    Past Surgical History:  Procedure Laterality Date  . APPENDECTOMY  2012  . ENDOMETRIAL ABLATION  02/2014   . TUBAL LIGATION  02/2014     Family Psychiatric History:   Family History  Problem Relation Age of Onset  . Hypertension Father   . Hyperlipidemia Father   . Cancer Father 22       CLL  . Anxiety disorder Father   . Depression Father   . Heart disease Father 63       CHF  . Hypertension Maternal Grandmother   . Hypothyroidism Maternal Grandmother        and great aunts   . Cancer Maternal Grandmother 74       on spinal cord, rare   . Hypertension Maternal Grandfather   . Bipolar disorder Paternal Grandmother   . Hypertension Paternal Grandmother   . Hyperlipidemia Paternal Grandmother    . Heart disease Paternal Grandmother   . Chronic fatigue Mother   . ADD / ADHD Daughter   . Anxiety disorder Daughter   . Depression Daughter     Social History:  Social History   Socioeconomic History  . Marital status: Divorced    Spouse name: Not on file  . Number of children: 1   . Years of education: grad schoo  . Highest education level: Not on file  Occupational History  . Not on file  Social Needs  . Financial resource strain: Not on file  . Food insecurity:    Worry: Not on file    Inability: Not on file  . Transportation needs:    Medical: Not on file    Non-medical: Not on file  Tobacco Use  . Smoking status: Former Smoker    Last attempt to quit: 11/14/2001    Years since quitting: 16.2  . Smokeless tobacco: Never Used  Substance and Sexual Activity  . Alcohol use: No    Alcohol/week: 0.0 oz    Comment: In Recovery  since 2006  . Drug use: No  . Sexual activity: Not Currently    Birth control/protection: Surgical  Lifestyle  . Physical activity:    Days per week: Not on file    Minutes per session: Not on file  . Stress: Not on file  Relationships  . Social connections:    Talks on phone: Not on file    Gets together: Not on file    Attends religious service: Not on file    Active member of club or organization: Not on file    Attends meetings of clubs or organizations: Not on file    Relationship status: Not on file  Other Topics Concern  . Not on file  Social History Narrative   Social Hx:   Current living situation: lives in [place] with mother and daughter   Born and Raised in [place] by both parents. 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   Kids: 1  daughter  (15yo)   Legal issues: denies   recovery since 2006.   Alcoholism age [age]; binge drinking; benzo abuse stopped in 2008. Pt goes to AA 3x/week      Drugs: none; previous marijuna use about 5 times in her life time      Tobacco: quit smoking in 2002; smoked x 25 years      Exercise:  no formal exercise in 2018.      Allergies:  Allergies  Allergen Reactions  . Azithromycin Nausea And Vomiting  . Codeine Nausea And Vomiting  . Other     Pt prefers not to have pain medication if not necessary- recovering alcoholic.   Ebbie Ridge [Pseudoephedrine Hcl]     "Creepy crawly skin"--if taken consistently  . Zoloft [Sertraline Hcl]     Due to bipolar disorder-makes her manic.   Marland Kitchen Penicillins Rash    Has patient had a PCN reaction causing immediate rash, facial/tongue/throat swelling, SOB or lightheadedness with hypotension: No Has patient had a PCN reaction causing severe rash involving mucus membranes or skin necrosis: No Has patient had a PCN reaction that required hospitalization: No Has patient had a PCN reaction occurring within the last 10 years: No If all of the above answers are "NO", then may proceed with Cephalosporin use.   . Sulfa Antibiotics Rash    Metabolic Disorder Labs: Lab Results  Component Value Date   HGBA1C 5.4 10/18/2017   MPG 100 08/10/2016   Lab Results  Component Value Date   PROLACTIN 11.4 10/18/2017   Lab Results  Component Value Date   CHOL 328 (H) 10/18/2017   TRIG 90 10/18/2017   HDL 74 10/18/2017   CHOLHDL 4.4 10/18/2017   VLDL 22 08/10/2016   LDLCALC 236 (H) 10/18/2017   LDLCALC 227 (H) 08/10/2016   Lab Results  Component Value Date   TSH 1.280 10/18/2017   TSH 1.28 08/10/2016    Therapeutic Level Labs: No results found for: LITHIUM No results found for: VALPROATE No components found for:  CBMZ  Current Medications: Current Outpatient Medications  Medication Sig Dispense Refill  . lamoTRIgine (LAMICTAL) 200 MG tablet Take  1.5 tablets (300 mg total) by mouth daily. 45 tablet 3  . lisinopril (PRINIVIL,ZESTRIL) 10 MG tablet Take 1 tablet (10 mg total) by mouth daily. 30 tablet 5  . LYSINE PO Take 1 tablet by mouth daily.    . mirtazapine (REMERON) 45 MG tablet Take 1 tablet (45 mg total) by mouth at bedtime. 30 tablet 3  . Multiple Vitamin (MULTIVITAMIN) tablet Take 1 tablet by mouth daily.    . NON FORMULARY Take 1 tablet by mouth daily. "LAJ"    . polyvinyl alcohol (LIQUIFILM TEARS) 1.4 % ophthalmic solution Place 1 drop into both eyes daily.    . Red Yeast Rice Extract (RED YEAST RICE PO) Take 1 tablet by mouth daily.    . ziprasidone (GEODON) 80 MG capsule Take 1 capsule (80 mg total) by mouth daily. 30 capsule 3   No current facility-administered medications for this visit.      Musculoskeletal: Strength & Muscle Tone: within normal limits Gait & Station: normal Patient leans: N/A  Psychiatric Specialty Exam: Review of Systems  Constitutional: Negative for chills, fever, malaise/fatigue and weight loss.  Psychiatric/Behavioral: Negative for depression, hallucinations, substance abuse and suicidal ideas. The patient is not nervous/anxious and does not have insomnia.     Blood pressure 123/76, pulse 72, height 5\' 5"  (1.651 m), weight 159 lb (72.1 kg), SpO2 100 %.Body mass index is 26.46 kg/m.  General Appearance: Fairly Groomed  Eye Contact:  Good  Speech:  Clear and Coherent and Normal Rate  Volume:  Normal  Mood:  Euthymic  Affect:  Full Range  Thought Process:  Goal Directed and Descriptions of Associations: Intact  Orientation:  Full (Time, Place, and Person)  Thought Content: Logical   Suicidal Thoughts:  No  Homicidal Thoughts:  No  Memory:  Immediate;   Good Recent;   Good Remote;   Good  Judgement:  Good  Insight:  Good  Psychomotor Activity:  Normal  Concentration:  Concentration: Good and Attention Span: Good  Recall:  Good  Fund of Knowledge: Good  Language: Good  Akathisia:   No  Handed:  Right  AIMS (if indicated): not done  Assets:  Communication Skills Desire for Improvement Financial Resources/Insurance Housing Leisure Time [employer] Talents/Skills Transportation Vocational/Educational  ADL's:  Intact  Cognition: WNL  Sleep:  Good   Screenings: PHQ2-9     Office Visit from 10/18/2017 in Primary Care at [facility] from 04/05/2017 in Primary Care at [facility] from 08/10/2016 in Primary Care at [facility] from 10/28/2015 in [facility] Office Visit from 10/15/2014 in [facility]  PHQ-2 Total Score  0  0  0  0  0      I reviewed the information below on 02/15/2018 and agree except where noted Assessment and Plan: Bipolar II d/o; PTSD; GAD; Alcohol use disorder in remission    Medication management with supportive therapy. Risks/benefits and SE of the medication discussed. Pt verbalized understanding and verbal consent obtained for treatment.  Affirm with the patient that the medications are taken as ordered. Patient expressed understanding of how their medications were to be used.   Meds:  Geodon 80mg  po qD for Bipolar disorder Remeron 45mg  po q D for mood and anxiety Lamictal 300mg  po qD for Bipolar disorder  Labs: 10/18/2017 LDL and chol elevated, CMP WNL, CBC WNL, TSH WNL, Prolactin and HbA1c WNL  Therapy: brief supportive therapy provided. Discussed psychosocial stressors in detail.     Consultations: Encouraged to follow up with therapist Encouraged to follow up with PCP as needed  Pt denies SI and is at an acute low risk for suicide. Patient told to call clinic if any problems occur. Patient advised to go to ER if they should develop SI/HI, side effects, or if symptoms worsen. Has crisis numbers to call if needed. Pt verbalized understanding.  F/up in 4 months or sooner if needed   Charlcie Cradle, MD 02/15/2018,  4:13 PM

## 2018-04-05 ENCOUNTER — Encounter: Payer: Self-pay | Admitting: Family Medicine

## 2018-04-23 ENCOUNTER — Other Ambulatory Visit: Payer: Self-pay

## 2018-04-23 ENCOUNTER — Ambulatory Visit: Payer: 59 | Admitting: Family Medicine

## 2018-04-23 ENCOUNTER — Encounter: Payer: Self-pay | Admitting: Family Medicine

## 2018-04-23 VITALS — BP 118/80 | HR 70 | Temp 98.0°F | Resp 16 | Ht 64.96 in | Wt 159.4 lb

## 2018-04-23 DIAGNOSIS — E7800 Pure hypercholesterolemia, unspecified: Secondary | ICD-10-CM | POA: Diagnosis not present

## 2018-04-23 DIAGNOSIS — I10 Essential (primary) hypertension: Secondary | ICD-10-CM

## 2018-04-23 DIAGNOSIS — R232 Flushing: Secondary | ICD-10-CM | POA: Diagnosis not present

## 2018-04-23 MED ORDER — LISINOPRIL 10 MG PO TABS: 10.0000 mg | ORAL_TABLET | Freq: Every day | ORAL | 1 refills | Status: DC

## 2018-04-23 NOTE — Progress Notes (Signed)
Subjective:    Patient ID: Nancy Bradshaw, female    DOB: 24-Jun-1966, 53 y.o.   MRN: [id number]  04/23/2018  Chronic Conditions (6 month follow-up )    HPI This 52 y.o. female presents for six month follow-up evaluation of hypertension, hyperlipidemia.  No changes to management made at last visit. Weight loss. Upset that has not lost more. [place]. Was walking twenty miles per week. L hip pain from sleeping on couch on hospital.  Hiking.   BP Readings from Last 3 Encounters:  04/23/18 118/80  10/18/17 120/72  10/12/17 136/80   Wt Readings from Last 3 Encounters:  04/23/18 159 lb 6.4 oz (72.3 kg)  10/18/17 161 lb (73 kg)  04/05/17 168 lb 3.2 oz (76.3 kg)   Immunization History  Administered Date(s) Administered  . Hepatitis A, Adult 03/30/2014, 08/10/2016  . Influenza,inj,Quad PF,6+ Mos 08/18/2014, 09/21/2015, 08/10/2016  . Influenza-Unspecified 08/14/2017  . Pneumococcal Polysaccharide-23 10/15/2014  . Td 07/17/2002  . Tdap 07/11/2008    Review of Systems  Constitutional: Positive for diaphoresis. Negative for activity change, appetite change, chills, fatigue, fever and unexpected weight change.  HENT: Negative for congestion, dental problem, drooling, ear discharge, ear pain, facial swelling, hearing loss, mouth sores, nosebleeds, postnasal drip, rhinorrhea, sinus pressure, sneezing, sore throat, tinnitus, trouble swallowing and voice change.   Eyes: Negative for photophobia, pain, discharge, redness, itching and visual disturbance.  Respiratory: Negative for apnea, cough, choking, chest tightness, shortness of breath, wheezing and stridor.   Cardiovascular: Negative for chest pain, palpitations and leg swelling.  Gastrointestinal: Negative for abdominal distention, abdominal pain, anal bleeding, blood in stool, constipation, diarrhea, nausea, rectal pain and vomiting.  Endocrine: Negative for cold intolerance, heat intolerance,  polydipsia, polyphagia and polyuria.  Genitourinary: Negative for decreased urine volume, difficulty urinating, dyspareunia, dysuria, enuresis, flank pain, frequency, genital sores, hematuria, menstrual problem, pelvic pain, urgency, vaginal bleeding, vaginal discharge and vaginal pain.  Musculoskeletal: Negative for arthralgias, back pain, gait problem, joint swelling, myalgias, neck pain and neck stiffness.  Skin: Negative for color change, pallor, rash and wound.  Allergic/Immunologic: Negative for environmental allergies, food allergies and immunocompromised state.  Neurological: Negative for dizziness, tremors, seizures, syncope, facial asymmetry, speech difficulty, weakness, light-headedness, numbness and headaches.  Hematological: Negative for adenopathy. Does not bruise/bleed easily.  Psychiatric/Behavioral: Negative for agitation, behavioral problems, confusion, decreased concentration, dysphoric mood, hallucinations, self-injury, sleep disturbance and suicidal ideas. The patient is not nervous/anxious and is not hyperactive.     Past Medical History:  Diagnosis Date  . Alcohol abuse   . Allergy   . Anxiety Dx 2006  . Bipolar 2 disorder [facility]) Dx 2008   [facility]  . Heart murmur    Dx at age of 37  . Hx of dysmenorrhea 07/20/2012   Seasonale prescribed for severe dysmenorrhea and heavy bleeding with clotting; on OCP, menses lasts 4 days with light bleeding.   Marland Kitchen Hyperlipidemia    Dx at age of 34  . Hypertension Dx 2009  . PTSD (post-traumatic stress disorder) Dx 2008  . Substance abuse ([place])    No alcohol since 2008   Past Surgical History:  Procedure Laterality Date  . APPENDECTOMY  2012  . ENDOMETRIAL ABLATION  02/2014   . TUBAL LIGATION  02/2014    Allergies  Allergen Reactions  . Azithromycin Nausea And Vomiting  . Codeine Nausea And Vomiting  . Other     Pt prefers not to have pain medication  if not necessary- recovering alcoholic.   Ebbie Ridge  [Pseudoephedrine Hcl]     "Creepy crawly skin"--if taken consistently  . Zoloft [Sertraline Hcl]     Due to bipolar disorder-makes her manic.   Marland Kitchen Penicillins Rash    Has patient had a PCN reaction causing immediate rash, facial/tongue/throat swelling, SOB or lightheadedness with hypotension: No Has patient had a PCN reaction causing severe rash involving mucus membranes or skin necrosis: No Has patient had a PCN reaction that required hospitalization: No Has patient had a PCN reaction occurring within the last 10 years: No If all of the above answers are "NO", then may proceed with Cephalosporin use.   . Sulfa Antibiotics Rash   Current Outpatient Medications on File Prior to Visit  Medication Sig Dispense Refill  . LYSINE PO Take 1 tablet by mouth daily.    . mirtazapine (REMERON) 45 MG tablet Take 1 tablet (45 mg total) by mouth at bedtime. 30 tablet 3  . Multiple Vitamin (MULTIVITAMIN) tablet Take 1 tablet by mouth daily.    . NON FORMULARY Take 1 tablet by mouth daily. "LAJ"    . polyvinyl alcohol (LIQUIFILM TEARS) 1.4 % ophthalmic solution Place 1 drop into both eyes daily.    . Red Yeast Rice Extract (RED YEAST RICE PO) Take 1 tablet by mouth daily.     No current facility-administered medications on file prior to visit.    Social History   Socioeconomic History  . Marital status: Divorced    Spouse name: Not on file  . Number of children: 1   . Years of education: grad schoo  . Highest education level: Not on file  Occupational History  . Not on file  Social Needs  . Financial resource strain: Not on file  . Food insecurity:    Worry: Not on file    Inability: Not on file  . Transportation needs:    Medical: Not on file    Non-medical: Not on file  Tobacco Use  . Smoking status: Former Smoker    Last attempt to quit: 11/14/2001    Years since quitting: 16.5  . Smokeless tobacco: Never Used  Substance and Sexual Activity  . Alcohol use: No    Alcohol/week: 0.0 oz      Comment: In Recovery  since 2006  . Drug use: No  . Sexual activity: Not Currently    Birth control/protection: Surgical  Lifestyle  . Physical activity:    Days per week: Not on file    Minutes per session: Not on file  . Stress: Not on file  Relationships  . Social connections:    Talks on phone: Not on file    Gets together: Not on file    Attends religious service: Not on file    Active member of club or organization: Not on file    Attends meetings of clubs or organizations: Not on file    Relationship status: Not on file  . Intimate partner violence:    Fear of current or ex partner: Not on file    Emotionally abused: Not on file    Physically abused: Not on file    Forced sexual activity: Not on file  Other Topics Concern  . Not on file  Social History Narrative   Social Hx:   Current living situation: lives in [place] with mother and daughter   Born and Raised in [place] by both parents. 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   Kids: 1 daughter  (15yo)   Legal issues: denies   recovery since 2006.   Alcoholism age [age]; binge drinking; benzo abuse stopped in 2008. Pt goes to AA 3x/week      Drugs: none; previous marijuna use about 5 times in her life time      Tobacco: quit smoking in 2002; smoked x 25 years      Exercise: no formal exercise in 2018.     Family History  Problem Relation Age of Onset  . Hypertension Father   . Hyperlipidemia Father   . Cancer Father 42       CLL  . Anxiety disorder Father   . Depression Father   . Heart disease Father 40       CHF  . Hypertension Maternal Grandmother   . Hypothyroidism Maternal Grandmother        and great aunts   .  Cancer Maternal Grandmother 74       on spinal cord, rare   . Hypertension Maternal Grandfather   . Bipolar disorder Paternal Grandmother   . Hypertension Paternal Grandmother   . Hyperlipidemia Paternal Grandmother   . Heart disease Paternal Grandmother   . Chronic fatigue Mother   . ADD / ADHD Daughter   . Anxiety disorder Daughter   . Depression Daughter        Objective:    BP 118/80   Pulse 70   Temp 98 F (36.7 C) (Oral)   Resp 16   Ht 5' 4.96" (1.65 m)   Wt 159 lb 6.4 oz (72.3 kg)   SpO2 98%   BMI 26.56 kg/m  Physical Exam  Constitutional: She is oriented to person, place, and time. She appears well-developed and well-nourished. No distress.  HENT:  Head: Normocephalic and atraumatic.  Right Ear: External ear normal.  Left Ear: External ear normal.  Nose: Nose normal.  Mouth/Throat: Oropharynx is clear and moist.  Eyes: Pupils are equal, round, and reactive to light. Conjunctivae and EOM are normal.  Neck: Normal range of motion and full passive range of motion without pain. Neck supple. No JVD present. Carotid bruit is not present. No thyromegaly present.  Cardiovascular: Normal rate, regular rhythm and normal heart sounds. Exam reveals no gallop and no friction rub.  No murmur heard. Pulmonary/Chest: Effort normal and breath sounds normal. She has no wheezes. She has no rales.  Abdominal: Soft. Bowel sounds are normal. She exhibits no distension and no mass. There is no tenderness. There is no rebound and no guarding.  Musculoskeletal:       Right shoulder: Normal.       Left shoulder: Normal.       Cervical back: Normal.  Lymphadenopathy:    She has no cervical adenopathy.  Neurological: She is alert and oriented to person, place, and time. She has normal reflexes. No cranial nerve deficit. She exhibits normal muscle tone. Coordination normal.  Skin: Skin is warm and dry. No rash noted. She is not diaphoretic. No erythema. No pallor.  Psychiatric: She has a  normal mood and affect. Her behavior is normal. Judgment and thought content normal.  Nursing note and vitals reviewed.  No results  found. Depression screen [facility] 2/9 04/23/2018 10/18/2017 04/05/2017 08/10/2016 10/28/2015  Decreased Interest 0 0 0 0 0  Down, Depressed, Hopeless 0 0 0 0 0  PHQ - 2 Score 0 0 0 0 0   Fall Risk  04/23/2018 10/18/2017 04/05/2017 08/10/2016 10/15/2014  Falls in the past year? No No No No No        Assessment & Plan:   1. Essential hypertension   2. Pure hypercholesterolemia   3. Hot flashes     Hypertension well controlled; obtain labs; continue current medications.  Hypercholesterolemia: moderately controlled; congratulations on weight loss and exercise. Obtain labs.   I recommend weight loss, exercise, and low-cholesterol low-fat food choices.  I recommend limiting red meat to once per week; I recommend limiting fried foods to once per month.  Hot flashes: New.  Obtain labs; patient curious if menopausal or perimenopausal state.   Orders Placed This Encounter  Procedures  . CBC with Differential/Platelet  . Comprehensive metabolic panel    Order Specific Question:   Has the patient fasted?    Answer:   No  . Lipid panel    Order Specific Question:   Has the patient fasted?    Answer:   No  . TSH  . FSH/LH   Meds ordered this encounter  Medications  . lisinopril (PRINIVIL,ZESTRIL) 10 MG tablet    Sig: Take 1 tablet (10 mg total) by mouth daily.    Dispense:  90 tablet    Refill:  1    Return in about 6 months (around 10/23/2018) for complete physical examiniation STALLINGS.   Nancy Bradshaw, M.D. Primary Care at [facility] previously [facility] [address] [place], [place]  [address] [phone] phone [phone] fax

## 2018-04-23 NOTE — Patient Instructions (Addendum)
IF you received an x-ray today, you will receive an invoice from [facility]. Please contact [facility] at [phone] with questions or concerns regarding your invoice.   IF you received labwork today, you will receive an invoice from [facility]. Please contact [facility] at [phone] with questions or concerns regarding your invoice.   Our billing staff will not be able to assist you with questions regarding bills from these companies.  You will be contacted with the lab results as soon as they are available. The fastest way to get your results is to activate your My Chart account. Instructions are located on the last page of this paperwork. If you have not heard from [place] regarding the results in 2 weeks, please contact this office.     Menopause Menopause is the normal time of life when menstrual periods stop completely. Menopause is complete when you have missed 12 consecutive menstrual periods. It usually occurs between the ages of 58 years and 52 years. Very rarely does a woman develop menopause before the age of 33 years. At menopause, your ovaries stop producing the female hormones estrogen and progesterone. This can cause undesirable symptoms and also affect your health. Sometimes the symptoms may occur 4-5 years before the menopause begins. There is no relationship between menopause and:  Oral contraceptives.  Number of children you had.  Race.  The age your menstrual periods started (menarche).  Heavy smokers and very thin women may develop menopause earlier in life. What are the causes?  The ovaries stop producing the female hormones estrogen and progesterone. Other causes include:  Surgery to remove both ovaries.  The ovaries stop functioning for no known reason.  Tumors of the pituitary gland in the brain.  Medical disease that affects the ovaries and hormone production.  Radiation treatment to the abdomen or pelvis.  Chemotherapy that  affects the ovaries.  What are the signs or symptoms?  Hot flashes.  Night sweats.  Decrease in sex drive.  Vaginal dryness and thinning of the vagina causing painful intercourse.  Dryness of the skin and developing wrinkles.  Headaches.  Tiredness.  Irritability.  Memory problems.  Weight gain.  Bladder infections.  Hair growth of the face and chest.  Infertility. More serious symptoms include:  Loss of bone (osteoporosis) causing breaks (fractures).  Depression.  Hardening and narrowing of the arteries (atherosclerosis) causing heart attacks and strokes.  How is this diagnosed?  When the menstrual periods have stopped for 12 straight months.  Physical exam.  Hormone studies of the blood. How is this treated? There are many treatment choices and nearly as many questions about them. The decisions to treat or not to treat menopausal changes is an individual choice made with your health care provider. Your health care provider can discuss the treatments with you. Together, you can decide which treatment will work best for you. Your treatment choices may include:  Hormone therapy (estrogen and progesterone).  Non-hormonal medicines.  Treating the individual symptoms with medicine (for example antidepressants for depression).  Herbal medicines that may help specific symptoms.  Counseling by a psychiatrist or psychologist.  Group therapy.  Lifestyle changes including: ? Eating healthy. ? Regular exercise. ? Limiting caffeine and alcohol. ? Stress management and meditation.  No treatment.  Follow these instructions at home:  Take the medicine your health care provider gives you as directed.  Get plenty of sleep and rest.  Exercise regularly.  Eat a diet that contains calcium (good for the bones)  and soy products (acts like estrogen hormone).  Avoid alcoholic beverages.  Do not smoke.  If you have hot flashes, dress in layers.  Take  supplements, calcium, and vitamin D to strengthen bones.  You can use over-the-counter lubricants or moisturizers for vaginal dryness.  Group therapy is sometimes very helpful.  Acupuncture may be helpful in some cases. Contact a health care provider if:  You are not sure you are in menopause.  You are having menopausal symptoms and need advice and treatment.  You are still having menstrual periods after age 30 years.  You have pain with intercourse.  Menopause is complete (no menstrual period for 12 months) and you develop vaginal bleeding.  You need a referral to a specialist (gynecologist, psychiatrist, or psychologist) for treatment. Get help right away if:  You have severe depression.  You have excessive vaginal bleeding.  You fell and think you have a broken bone.  You have pain when you urinate.  You develop leg or chest pain.  You have a fast pounding heart beat (palpitations).  You have severe headaches.  You develop vision problems.  You feel a lump in your breast.  You have abdominal pain or severe indigestion. This information is not intended to replace advice given to you by your health care provider. Make sure you discuss any questions you have with your health care provider. Document Released: 01/21/2004 Document Revised: 04/07/2016 Document Reviewed: 05/30/2013 Elsevier Interactive Patient Education  2017 [employer].

## 2018-04-24 LAB — CBC WITH DIFFERENTIAL/PLATELET
BASOS ABS: 0 10*3/uL (ref 0.0–0.2)
Basos: 1 %
EOS (ABSOLUTE): 0.2 10*3/uL (ref 0.0–0.4)
Eos: 2 %
HEMOGLOBIN: 13 g/dL (ref 11.1–15.9)
Hematocrit: 38.7 % (ref 34.0–46.6)
IMMATURE GRANS (ABS): 0 10*3/uL (ref 0.0–0.1)
Immature Granulocytes: 0 %
LYMPHS: 32 %
Lymphocytes Absolute: 2.4 10*3/uL (ref 0.7–3.1)
MCH: 30.4 pg (ref 26.6–33.0)
MCHC: 33.6 g/dL (ref 31.5–35.7)
MCV: 91 fL (ref 79–97)
MONOCYTES: 7 %
Monocytes Absolute: 0.5 10*3/uL (ref 0.1–0.9)
NEUTROS PCT: 58 %
Neutrophils Absolute: 4.6 10*3/uL (ref 1.4–7.0)
Platelets: 273 10*3/uL (ref 150–450)
RBC: 4.27 x10E6/uL (ref 3.77–5.28)
RDW: 13.6 % (ref 12.3–15.4)
WBC: 7.7 10*3/uL (ref 3.4–10.8)

## 2018-04-24 LAB — COMPREHENSIVE METABOLIC PANEL
A/G RATIO: 2.2 (ref 1.2–2.2)
ALT: 10 IU/L (ref 0–32)
AST: 15 IU/L (ref 0–40)
Albumin: 4.6 g/dL (ref 3.5–5.5)
Alkaline Phosphatase: 73 IU/L (ref 39–117)
BUN/Creatinine Ratio: 20 (ref 9–23)
BUN: 20 mg/dL (ref 6–24)
Bilirubin Total: 0.2 mg/dL (ref 0.0–1.2)
CALCIUM: 9.6 mg/dL (ref 8.7–10.2)
CO2: 25 mmol/L (ref 20–29)
Chloride: 102 mmol/L (ref 96–106)
Creatinine, Ser: 1.02 mg/dL — ABNORMAL HIGH (ref 0.57–1.00)
GFR calc Af Amer: 75 mL/min/{1.73_m2} (ref >59)
GFR calc non Af Amer: 65 mL/min/{1.73_m2} (ref >59)
GLOBULIN, TOTAL: 2.1 g/dL (ref 1.5–4.5)
Glucose: 80 mg/dL (ref 65–99)
POTASSIUM: 4.4 mmol/L (ref 3.5–5.2)
SODIUM: 141 mmol/L (ref 134–144)
Total Protein: 6.7 g/dL (ref 6.0–8.5)

## 2018-04-24 LAB — LIPID PANEL
CHOL/HDL RATIO: 4.2 ratio (ref 0.0–4.4)
Cholesterol, Total: 293 mg/dL — ABNORMAL HIGH (ref 100–199)
HDL: 69 mg/dL (ref >39)
LDL Calculated: 193 mg/dL — ABNORMAL HIGH (ref 0–99)
TRIGLYCERIDES: 153 mg/dL — AB (ref 0–149)
VLDL Cholesterol Cal: 31 mg/dL (ref 5–40)

## 2018-04-24 LAB — FSH/LH
FSH: 46.1 m[IU]/mL
LH: 10.1 m[IU]/mL

## 2018-04-24 LAB — TSH: TSH: 2.09 u[IU]/mL (ref 0.450–4.500)

## 2018-04-30 ENCOUNTER — Encounter: Payer: Self-pay | Admitting: Family Medicine

## 2018-06-09 ENCOUNTER — Other Ambulatory Visit (HOSPITAL_COMMUNITY): Payer: Self-pay | Admitting: Psychiatry

## 2018-06-09 DIAGNOSIS — F3181 Bipolar II disorder: Secondary | ICD-10-CM

## 2018-06-13 ENCOUNTER — Other Ambulatory Visit (HOSPITAL_COMMUNITY): Payer: Self-pay

## 2018-06-13 DIAGNOSIS — F3181 Bipolar II disorder: Secondary | ICD-10-CM

## 2018-06-13 MED ORDER — LAMOTRIGINE 200 MG PO TABS: 300.0000 mg | ORAL_TABLET | Freq: Every day | ORAL | 0 refills | Status: DC

## 2018-06-13 MED ORDER — ZIPRASIDONE HCL 80 MG PO CAPS
80.0000 mg | ORAL_CAPSULE | Freq: Every day | ORAL | 0 refills | Status: DC
Start: 2018-06-13 — End: 2018-06-21

## 2018-06-16 ENCOUNTER — Encounter: Payer: Self-pay | Admitting: Family Medicine

## 2018-06-21 ENCOUNTER — Encounter (HOSPITAL_COMMUNITY): Payer: Self-pay | Admitting: Psychiatry

## 2018-06-21 ENCOUNTER — Ambulatory Visit (HOSPITAL_COMMUNITY): Payer: Self-pay | Admitting: Psychiatry

## 2018-06-21 ENCOUNTER — Ambulatory Visit (HOSPITAL_COMMUNITY): Payer: 59 | Admitting: Psychiatry

## 2018-06-21 DIAGNOSIS — Z87891 Personal history of nicotine dependence: Secondary | ICD-10-CM | POA: Diagnosis not present

## 2018-06-21 DIAGNOSIS — F3181 Bipolar II disorder: Secondary | ICD-10-CM | POA: Diagnosis not present

## 2018-06-21 DIAGNOSIS — F411 Generalized anxiety disorder: Secondary | ICD-10-CM

## 2018-06-21 DIAGNOSIS — F4310 Post-traumatic stress disorder, unspecified: Secondary | ICD-10-CM | POA: Diagnosis not present

## 2018-06-21 MED ORDER — LAMOTRIGINE 200 MG PO TABS: 300.0000 mg | ORAL_TABLET | Freq: Every day | ORAL | 1 refills | Status: DC

## 2018-06-21 MED ORDER — ZIPRASIDONE HCL 80 MG PO CAPS: 80.0000 mg | ORAL_CAPSULE | Freq: Every day | ORAL | 1 refills | Status: DC

## 2018-06-21 MED ORDER — MIRTAZAPINE 45 MG PO TABS: 45.0000 mg | ORAL_TABLET | Freq: Every day | ORAL | 1 refills | Status: DC

## 2018-06-21 NOTE — Progress Notes (Signed)
[facility] MD/PA/NP OP Progress Note  06/21/2018 4:05 PM Nancy Bradshaw  MRN:  [id number]  Chief Complaint:  Chief Complaint    Follow-up     HPI: She states she is doing well.  She is now certified and licensed.  She is very proud of her accomplishment and states that she has celebrated at home, with friends and at work.  She very much enjoyed her trip with the church backpacking in the [employer].  She wants to continue to do so in the future.  Dub Mikes has been walking almost daily and will often use a 20 pound fast to maintain her strength.  She denies depression, anhedonia, hopelessness.  Sleep is good.  She denies SI/HI.  She denies any manic or hypomanic-like symptoms.  She states that she is productive and enjoys her work.  She has a very good support system.  She has been sober from alcohol for 10 years and does not smoke.  She denies any anxiety or PTSD symptoms at this time.  She feels her current medications keep her stable and would like to continue.  She denies any side effects.   Visit Diagnosis:    ICD-10-CM   1. Bipolar II disorder in full remission ([place]) F31.81 lamoTRIgine (LAMICTAL) 200 MG tablet    mirtazapine (REMERON) 45 MG tablet    ziprasidone (GEODON) 80 MG capsule  2. PTSD (post-traumatic stress disorder) F43.10 mirtazapine (REMERON) 45 MG tablet  3. GAD (generalized anxiety disorder) F41.1 mirtazapine (REMERON) 45 MG tablet      Past Psychiatric History:  Dx: Post partum depression after first daughter was born in 2003. Bipolar II in 2005; PTSD dx in 2008; Alcohol use disorder in remission Meds: Zoloft- took it for 3 days and it causes severe suicidal ideations; Paxil- caused hypomania; Ativan; Xanax; Lamictal Previous psychiatrist/therapist: Dr. Paula Libra; prior to that Dr. Debbe Bales Hospitalizations: [facility] in 2004 after starting Zoloft for 4 days SIB: denies Suicide attempts: denies but does have hx of SI, last time seriously was in 2008 Hx of  violent behavior towards others: denies Current access to guns: denies Hx of abuse: yes see PTSD [employer] Hx: denies Hx of Seizures: denies Hx of TBI: denies   Previous Psychotropic Medications: Yes    Consequences of Substance Abuse: Medical Consequences: Pt has been sober from alcohol since 2006 and benzos since 2008. Reports she goes to [place] meeting 3x/week and has sponser. Pt is sponsering 2 women right now. Pt denies any hx of DUI/DWI and detox/rehab. Pt reports she was binge drinker and would often black out.  Legal Consequences: denies Family Consequences: broken relationships Blackouts: yes DT's: denies Withdrawal Symptoms: Nausea Tremors Vomiting    Past Medical History:  Past Medical History:  Diagnosis Date  . Alcohol abuse   . Allergy   . Anxiety Dx 2006  . Bipolar 2 disorder [facility]) Dx 2008   [facility] 2011-2017  . Heart murmur    Dx at age of 8  . Hx of dysmenorrhea 07/20/2012   Seasonale prescribed for severe dysmenorrhea and heavy bleeding with clotting; on OCP, menses lasts 4 days with light bleeding.   Marland Kitchen Hyperlipidemia    Dx at age of 89  . Hypertension Dx 2009  . PTSD (post-traumatic stress disorder) Dx 2008  . Substance abuse ([place])    No alcohol since 2008    Past Surgical History:  Procedure Laterality Date  . APPENDECTOMY  2012  . ENDOMETRIAL ABLATION  02/2014   . TUBAL  LIGATION  02/2014     Family Psychiatric History:   Family History  Problem Relation Age of Onset  . Hypertension Father   . Hyperlipidemia Father   . Cancer Father 75       CLL  . Anxiety disorder Father   . Depression Father   . Heart disease Father 49       CHF  . Hypertension Maternal Grandmother   . Hypothyroidism Maternal Grandmother        and great aunts   . Cancer Maternal Grandmother 74       on spinal cord, rare   . Hypertension Maternal Grandfather   . Bipolar disorder Paternal Grandmother   . Hypertension Paternal Grandmother   .  Hyperlipidemia Paternal Grandmother   . Heart disease Paternal Grandmother   . Chronic fatigue Mother   . ADD / ADHD Daughter   . Anxiety disorder Daughter   . Depression Daughter     Social History:  Social History   Socioeconomic History  . Marital status: Divorced    Spouse name: Not on file  . Number of children: 1   . Years of education: grad schoo  . Highest education level: Not on file  Occupational History  . Not on file  Social Needs  . Financial resource strain: Not on file  . Food insecurity:    Worry: Not on file    Inability: Not on file  . Transportation needs:    Medical: Not on file    Non-medical: Not on file  Tobacco Use  . Smoking status: Former Smoker    Last attempt to quit: 11/14/2001    Years since quitting: 16.6  . Smokeless tobacco: Never Used  Substance and Sexual Activity  . Alcohol use: No    Alcohol/week: 0.0 standard drinks    Comment: In Recovery  since 2006  . Drug use: No  . Sexual activity: Not Currently    Birth control/protection: Surgical  Lifestyle  . Physical activity:    Days per week: Not on file    Minutes per session: Not on file  . Stress: Not on file  Relationships  . Social connections:    Talks on phone: Not on file    Gets together: Not on file    Attends religious service: Not on file    Active member of club or organization: Not on file    Attends meetings of clubs or organizations: Not on file    Relationship status: Not on file  Other Topics Concern  . Not on file  Social History Narrative   Social Hx:   Current living situation: lives in [place] with mother and daughter   Born and Raised in [place] by both parents. 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   Kids: 1 daughter  (15yo)   Legal issues: denies   recovery since 2006.   Alcoholism age [age]; binge drinking; benzo abuse stopped in 2008. Pt goes to AA 3x/week      Drugs: none; previous marijuna use about 5 times in her life time      Tobacco: quit smoking in 2002; smoked x 25 years  Exercise: no formal exercise in 2018.      Allergies:  Allergies  Allergen Reactions  . Azithromycin Nausea And Vomiting  . Codeine Nausea And Vomiting  . Other     Pt prefers not to have pain medication if not necessary- recovering alcoholic.   [place] [Pseudoephedrine Hcl]     "Creepy crawly skin"--if taken consistently  . Zoloft [Sertraline Hcl]     Due to bipolar disorder-makes her manic.   Marland Kitchen Penicillins Rash    Has patient had a PCN reaction causing immediate rash, facial/tongue/throat swelling, SOB or lightheadedness with hypotension: No Has patient had a PCN reaction causing severe rash involving mucus membranes or skin necrosis: No Has patient had a PCN reaction that required hospitalization: No Has patient had a PCN reaction occurring within the last 10 years: No If all of the above answers are "NO", then may proceed with Cephalosporin use.   . Sulfa Antibiotics Rash    Metabolic Disorder Labs: Lab Results  Component Value Date   HGBA1C 5.4 10/18/2017   MPG 100 08/10/2016   Lab Results  Component Value Date   PROLACTIN 11.4 10/18/2017   Lab Results  Component Value Date   CHOL 293 (H) 04/23/2018   TRIG 153 (H) 04/23/2018   HDL 69 04/23/2018   CHOLHDL 4.2 04/23/2018   VLDL 22 08/10/2016   LDLCALC 193 (H) 04/23/2018   LDLCALC 236 (H) 10/18/2017   Lab Results  Component Value Date   TSH 2.090 04/23/2018   TSH 1.280 10/18/2017    Therapeutic Level Labs: No results found for: LITHIUM No results found for: VALPROATE No components found for:  CBMZ  Current Medications: Current Outpatient Medications  Medication Sig Dispense  Refill  . lamoTRIgine (LAMICTAL) 200 MG tablet Take 1.5 tablets (300 mg total) by mouth daily. 135 tablet 1  . lisinopril (PRINIVIL,ZESTRIL) 10 MG tablet Take 1 tablet (10 mg total) by mouth daily. 90 tablet 1  . LYSINE PO Take 1 tablet by mouth daily.    . mirtazapine (REMERON) 45 MG tablet Take 1 tablet (45 mg total) by mouth at bedtime. 90 tablet 1  . Multiple Vitamin (MULTIVITAMIN) tablet Take 1 tablet by mouth daily.    . Red Yeast Rice Extract (RED YEAST RICE PO) Take 1 tablet by mouth daily.    . ziprasidone (GEODON) 80 MG capsule Take 1 capsule (80 mg total) by mouth daily. 90 capsule 1  . NON FORMULARY Take 1 tablet by mouth daily. "LAJ"    . polyvinyl alcohol (LIQUIFILM TEARS) 1.4 % ophthalmic solution Place 1 drop into both eyes daily.     No current facility-administered medications for this visit.      Musculoskeletal: Strength & Muscle Tone: within normal limits Gait & Station: normal Patient leans: N/A  Psychiatric Specialty Exam:   Review of Systems  Gastrointestinal: Negative for abdominal pain, heartburn, nausea and vomiting.  Musculoskeletal: Negative for back pain, joint pain, myalgias and neck pain.    Blood pressure 122/70, pulse 80, height 5\' 5"  (1.651 m), weight 154 lb (69.9 kg).Body mass index is 25.63 kg/m.  General Appearance: Fairly Groomed  Eye Contact:  Good  Speech:  Clear and Coherent and Normal Rate  Volume:  Normal  Mood:  Euthymic  Affect:  Full Range  Thought Process:  Goal Directed and Descriptions of Associations: Intact  Orientation:  Full (Time, Place, and Person)  Thought Content:  Logical  Suicidal Thoughts:  No  Homicidal Thoughts:  No  Memory:  Immediate;   Good Recent;   Good Remote;   Good  Judgement:  Good  Insight:  Good  Psychomotor Activity:  Normal  Concentration:  Concentration: Good and Attention Span: Good  Recall:  Good  Fund of Knowledge:  Good  Language:  Good  Akathisia:  No  Handed:  Right  AIMS (if  indicated):     Assets:  Communication Skills Desire for Improvement Financial Resources/Insurance Housing Leisure Time Physical Health Resilience Social Support Talents/Skills Transportation Vocational/Educational  ADL's:  Intact  Cognition:  WNL  Sleep:   good     Screenings: PHQ2-9     Office Visit from 04/23/2018 in Primary Care at [facility] from 10/18/2017 in Primary Care at [facility] from 04/05/2017 in Primary Care at [facility] from 08/10/2016 in Primary Care at [facility] from 10/28/2015 in [facility]  PHQ-2 Total Score  0  0  0  0  0      I reviewed the information below on 06/21/2018 and agree Assessment and Plan: Bipolar II d/o; PTSD; GAD; Alcohol use disorder in remission    Medication management with supportive therapy. Risks/benefits and SE of the medication discussed. Pt verbalized understanding and verbal consent obtained for treatment.  Affirm with the patient that the medications are taken as ordered. Patient expressed understanding of how their medications were to be used.   Meds:  Geodon 80mg  po qD for Bipolar disorder Remeron 45mg  po q D for mood and anxiety Lamictal 300mg  po qD for Bipolar disorder  Labs: 04/23/18: CBC wnl, CMP- creat 1.02, Lipid panel elevated- working with PCP, TSH wnl, Prolactin level wnl EKG at PCP is upcoming in Dec  Therapy: brief supportive therapy provided. Discussed psychosocial stressors in detail.     Consultations: Encouraged to follow up with therapist Encouraged to follow up with PCP as needed  Pt denies SI and is at an acute low risk for suicide. Patient told to call clinic if any problems occur. Patient advised to go to ER if they should develop SI/HI, side effects, or if symptoms worsen. Has crisis numbers to call if needed. Pt verbalized understanding.  F/up in 6 months or sooner if needed   Charlcie Cradle, MD 06/21/2018, 4:05 PM

## 2018-07-16 ENCOUNTER — Encounter (HOSPITAL_COMMUNITY): Payer: Self-pay | Admitting: Emergency Medicine

## 2018-07-16 ENCOUNTER — Ambulatory Visit (HOSPITAL_COMMUNITY)
Admission: EM | Admit: 2018-07-16 | Discharge: 2018-07-16 | Disposition: A | Discharge status: Home / Self Care | Payer: 59 | Attending: Family Medicine | Admitting: Family Medicine

## 2018-07-16 ENCOUNTER — Other Ambulatory Visit: Payer: Self-pay

## 2018-07-16 DIAGNOSIS — R21 Rash and other nonspecific skin eruption: Secondary | ICD-10-CM

## 2018-07-16 MED ORDER — PREDNISONE 20 MG PO TABS: 20.0000 mg | ORAL_TABLET | Freq: Every day | ORAL | 0 refills | Status: AC

## 2018-07-16 MED ORDER — TRIAMCINOLONE ACETONIDE 0.1 % EX CREA: 1.0000 "application " | TOPICAL_CREAM | Freq: Two times a day (BID) | CUTANEOUS | 0 refills | Status: DC

## 2018-07-16 NOTE — Discharge Instructions (Signed)
You may take prednisone 20 mg daily with food May apply triamcinolone cream twice daily and thin amount to areas of itching May also use antihistamines like daily allergy pill or Benadryl at bedtime  Please continue to monitor symptoms, follow-up if rash spreading, worsening, not improving, spreading to vaginal area

## 2018-07-16 NOTE — ED Triage Notes (Signed)
Rash is on buttocks and straddle area.  Rash appeared on Saturday morning.  Patient did go hiking recently. Rash itches

## 2018-07-18 NOTE — ED Provider Notes (Signed)
[place]    CSN: [id number] Arrival date & time: 07/16/18  1911     History   Chief Complaint Chief Complaint  Patient presents with  . Rash    HPI Nancy Bradshaw is a 52 y.o. female history of anxiety, bipolar type II, hyperlipidemia, hypertension, resenting today for evaluation of a rash.  Patient states that Saturday morning, for approximately 2 days ago she started to develop a rash to her groin area as well as proximal lower extremities, extending slightly to trunk.  She notes that the day prior she went hiking and urinated in the woods, she did not see any poison ivy or other concerning plant in the area.  Rashes been associated with itching.  Has been applying cortisone cream with minimal relief of itching.  Denies any other new exposures.  Denies anyone else at home with similar symptoms.  She feels like rash has been spreading.  Concerned about use of high dose steroids as this is caused her to become manic previously, states that she tolerates low-dose for short course.  HPI  Past Medical History:  Diagnosis Date  . Alcohol abuse   . Allergy   . Anxiety Dx 2006  . Bipolar 2 disorder [facility]) Dx 2008   [facility] 2011-2017  . Heart murmur    Dx at age of 68  . Hx of dysmenorrhea 07/20/2012   Seasonale prescribed for severe dysmenorrhea and heavy bleeding with clotting; on OCP, menses lasts 4 days with light bleeding.   Marland Kitchen Hyperlipidemia    Dx at age of 79  . Hypertension Dx 2009  . PTSD (post-traumatic stress disorder) Dx 2008  . Substance abuse ([place])    No alcohol since 2008    Patient Active Problem List   Diagnosis Date Noted  . Esophageal reflux 10/28/2015  . Obesity (BMI 30.0-34.9) 10/28/2015  . PTSD (post-traumatic stress disorder) 10/15/2014  . Hyperlipidemia 10/15/2014  . Family history of hypothyroidism 10/15/2014  . Bipolar disorder ([place]) 06/21/2013  . Personal history of alcoholism ([place]) 07/20/2012  . HTN (hypertension) 07/20/2012     Past Surgical History:  Procedure Laterality Date  . APPENDECTOMY  2012  . ENDOMETRIAL ABLATION  02/2014   . TUBAL LIGATION  02/2014     OB History    Gravida  1   Para      Term      Preterm      AB      Living  1     SAB      TAB      Ectopic      Multiple      Live Births           Obstetric Comments  Pre-eclampsia          Home Medications    Prior to Admission medications   Medication Sig Start Date End Date Taking? Authorizing Provider  lamoTRIgine (LAMICTAL) 200 MG tablet Take 1.5 tablets (300 mg total) by mouth daily. 06/21/18   Charlcie Cradle, MD  lisinopril (PRINIVIL,ZESTRIL) 10 MG tablet Take 1 tablet (10 mg total) by mouth daily. 04/23/18   Wardell Honour, MD  LYSINE PO Take 1 tablet by mouth daily.    [provider]  mirtazapine (REMERON) 45 MG tablet Take 1 tablet (45 mg total) by mouth at bedtime. 06/21/18   Charlcie Cradle, MD  Multiple Vitamin (MULTIVITAMIN) tablet Take 1 tablet by mouth daily.    [provider]  Baruch Gouty  Take 1 tablet by mouth daily. "LAJ"    [provider]  polyvinyl alcohol (LIQUIFILM TEARS) 1.4 % ophthalmic solution Place 1 drop into both eyes daily.    [provider]  predniSONE (DELTASONE) 20 MG tablet Take 1 tablet (20 mg total) by mouth daily for 3 days. 07/16/18 07/19/18  Tayonna Bacha C, PA-C  Red Yeast Rice Extract (RED YEAST RICE PO) Take 1 tablet by mouth daily.    [provider]  triamcinolone cream (KENALOG) 0.1 % Apply 1 application topically 2 (two) times daily. 07/16/18   Alayah Knouff C, PA-C  ziprasidone (GEODON) 80 MG capsule Take 1 capsule (80 mg total) by mouth daily. 06/21/18   Charlcie Cradle, MD    Family History Family History  Problem Relation Age of Onset  . Hypertension Father   . Hyperlipidemia Father   . Cancer Father 79       CLL  . Anxiety disorder Father   . Depression Father   . Heart disease Father 8       CHF  . Hypertension  Maternal Grandmother   . Hypothyroidism Maternal Grandmother        and great aunts   . Cancer Maternal Grandmother 74       on spinal cord, rare   . Hypertension Maternal Grandfather   . Bipolar disorder Paternal Grandmother   . Hypertension Paternal Grandmother   . Hyperlipidemia Paternal Grandmother   . Heart disease Paternal Grandmother   . Chronic fatigue Mother   . ADD / ADHD Daughter   . Anxiety disorder Daughter   . Depression Daughter     Social History Social History   Tobacco Use  . Smoking status: Former Smoker    Last attempt to quit: 11/14/2001    Years since quitting: 16.6  . Smokeless tobacco: Never Used  Substance Use Topics  . Alcohol use: No    Alcohol/week: 0.0 standard drinks    Comment: In Recovery  since 2006  . Drug use: No     Allergies   Azithromycin; Codeine; Other; Sudafed [pseudoephedrine hcl]; Zoloft [sertraline hcl]; Penicillins; and Sulfa antibiotics   Review of Systems Review of Systems  Constitutional: Negative for fatigue and fever.  HENT: Negative for mouth sores.   Eyes: Negative for visual disturbance.  Respiratory: Negative for shortness of breath.   Cardiovascular: Negative for chest pain.  Gastrointestinal: Negative for abdominal pain, nausea and vomiting.  Genitourinary: Negative for genital sores.  Musculoskeletal: Negative for arthralgias and joint swelling.  Skin: Positive for color change and rash. Negative for wound.  Neurological: Negative for dizziness, weakness, light-headedness and headaches.     Physical Exam Triage Vital Signs ED Triage Vitals  Enc Vitals Group     BP 07/16/18 2016 (!) 131/58     Pulse Rate 07/16/18 2016 73     Resp 07/16/18 2016 18     Temp 07/16/18 2016 97.9 F (36.6 C)     Temp Source 07/16/18 2016 Oral     SpO2 07/16/18 2016 100 %     Weight --      Height --      Head Circumference --      Peak Flow --      Pain Score 07/16/18 2013 0     Pain Loc --      Pain Edu? --       Excl. in [place]? --    No data found.  Updated Vital Signs BP (!) 131/58 (BP  Location: Right Arm)   Pulse 73   Temp 97.9 F (36.6 C) (Oral)   Resp 18   SpO2 100%   Visual Acuity Right Eye Distance:   Left Eye Distance:   Bilateral Distance:    Right Eye Near:   Left Eye Near:    Bilateral Near:     Physical Exam  Constitutional: She is oriented to person, place, and time. She appears well-developed and well-nourished.  No acute distress  HENT:  Head: Normocephalic and atraumatic.  Nose: Nose normal.  Mouth/Throat: Oropharynx is clear and moist.  Eyes: Conjunctivae are normal.  Neck: Neck supple.  Cardiovascular: Normal rate.  Pulmonary/Chest: Effort normal. No respiratory distress.  Abdominal: She exhibits no distension.  Musculoskeletal: Normal range of motion.  Neurological: She is alert and oriented to person, place, and time.  Skin: Skin is warm and dry.  Erythematous small punctate lesions to lower extremities, becoming more concentrated near groin area as well as to distal trunk  Psychiatric: She has a normal mood and affect.  Nursing note and vitals reviewed.    UC Treatments / Results  Labs (all labs ordered are listed, but only abnormal results are displayed) Labs Reviewed - No data to display  EKG None  Radiology No results found.  Procedures Procedures (including critical care time)  Medications Ordered in UC Medications - No data to display  Initial Impression / Assessment and Plan / UC Course  I have reviewed the triage vital signs and the nursing notes.  Pertinent labs & imaging results that were available during my care of the patient were reviewed by me and considered in my medical decision making (see chart for details).     Rash concerning for possible contact dermatitis versus bug bites.  No involvement of mucosal surfaces.  Given associated itching and past history with prednisone, will defer any IM injections as well as high dose,  will provide patient with 20 mg prednisone daily for 3 days, as well as topical Kenalog as alternative to cortisone.  Also discussed using antihistamines. Discussed strict return precautions. Patient verbalized understanding and is agreeable with plan.  Final Clinical Impressions(s) / UC Diagnoses   Final diagnoses:  Rash and nonspecific skin eruption     Discharge Instructions     You may take prednisone 20 mg daily with food May apply triamcinolone cream twice daily and thin amount to areas of itching May also use antihistamines like daily allergy pill or Benadryl at bedtime  Please continue to monitor symptoms, follow-up if rash spreading, worsening, not improving, spreading to vaginal area   ED Prescriptions    Medication Sig Dispense Auth. Provider   predniSONE (DELTASONE) 20 MG tablet Take 1 tablet (20 mg total) by mouth daily for 3 days. 3 tablet Miachel Nardelli C, PA-C   triamcinolone cream (KENALOG) 0.1 % Apply 1 application topically 2 (two) times daily. 30 g Terius Jacuinde, [place] C, PA-C     Controlled Substance Prescriptions [place] Controlled Substance Registry consulted? Not Applicable   [name], [place] 07/18/18 [id number]

## 2018-08-03 ENCOUNTER — Encounter: Payer: Self-pay | Admitting: Family Medicine

## 2018-08-03 ENCOUNTER — Other Ambulatory Visit: Payer: Self-pay

## 2018-08-03 ENCOUNTER — Ambulatory Visit: Payer: 59 | Admitting: Family Medicine

## 2018-08-03 VITALS — BP 115/75 | HR 68 | Temp 97.8°F | Ht 65.0 in | Wt 153.0 lb

## 2018-08-03 DIAGNOSIS — N764 Abscess of vulva: Secondary | ICD-10-CM

## 2018-08-03 MED ORDER — DOXYCYCLINE HYCLATE 100 MG PO TABS: 100.0000 mg | ORAL_TABLET | Freq: Two times a day (BID) | ORAL | 0 refills | Status: DC

## 2018-08-03 NOTE — Patient Instructions (Addendum)
If you have lab work done today you will be contacted with your lab results within the next 2 weeks.  If you have not heard from [place] then please contact us. The fastest way to get your results is to register for My Chart.   IF you received an x-ray today, you will receive an invoice from [facility] Radiology. Please contact [facility] Radiology at [phone] with questions or concerns regarding your invoice.   IF you received labwork today, you will receive an invoice from [place]. Please contact [facility] at [phone] with questions or concerns regarding your invoice.   Our billing staff will not be able to assist you with questions regarding bills from these companies.  You will be contacted with the lab results as soon as they are available. The fastest way to get your results is to activate your My Chart account. Instructions are located on the last page of this paperwork. If you have not heard from [place] regarding the results in 2 weeks, please contact this office.     Skin Abscess A skin abscess is an infected area on or under your skin that contains a collection of pus and other material. An abscess may also be called a furuncle, carbuncle, or boil. An abscess can occur in or on almost any part of your body. Some abscesses break open (rupture) on their own. Most continue to get worse unless they are treated. The infection can spread deeper into the body and eventually into your blood, which can make you feel ill. Treatment usually involves draining the abscess. What are the causes? An abscess occurs when germs, often bacteria, pass through your skin and cause an infection. This may be caused by:  A scrape or cut on your skin.  A puncture wound through your skin, including a needle injection.  Blocked oil or sweat glands.  Blocked and infected hair follicles.  A cyst that forms beneath your skin (sebaceous cyst) and becomes infected.  What increases the risk? This  condition is more likely to develop in people who:  Have a weak body defense system (immune system).  Have diabetes.  Have dry and irritated skin.  Get frequent injections or use illegal IV drugs.  Have a foreign body in a wound, such as a splinter.  Have problems with their lymph system or veins.  What are the signs or symptoms? An abscess may start as a painful, firm bump under the skin. Over time, the abscess may get larger or become softer. Pus may appear at the top of the abscess, causing pressure and pain. It may eventually break through the skin and drain. Other symptoms include:  Redness.  Warmth.  Swelling.  Tenderness.  A sore on the skin.  How is this diagnosed? This condition is diagnosed based on your medical history and a physical exam. A sample of pus may be taken from the abscess to find out what is causing the infection and what antibiotics can be used to treat it. You also may have:  Blood tests to look for signs of infection or spread of an infection to your blood.  Imaging studies such as ultrasound, CT scan, or MRI if the abscess is deep.  How is this treated? Small abscesses that drain on their own may not need treatment. Treatment for an abscess that does not rupture on its own may include:  Warm compresses applied to the area several times per day.  Incision and drainage. Your health care provider  will make an incision to open the abscess and will remove pus and any foreign body or dead tissue. The incision area may be packed with gauze to keep it open for a few days while it heals.  Antibiotic medicines to treat infection. For a severe abscess, you may first get antibiotics through an IV and then change to oral antibiotics.  Follow these instructions at home: Abscess Care  If you have an abscess that has not drained, place a warm, clean, wet washcloth over the abscess several times a day. Do this as told by your health care provider.  Follow  instructions from your health care provider about how to take care of your abscess. Make sure you: ? Cover the abscess with a bandage (dressing). ? Change your dressing or gauze as told by your health care provider. ? Wash your hands with soap and water before you change the dressing or gauze. If soap and water are not available, use hand sanitizer.  Check your abscess every day for signs of a worsening infection. Check for: ? More redness, swelling, or pain. ? More fluid or blood. ? Warmth. ? More pus or a bad smell. Medicines  Take over-the-counter and prescription medicines only as told by your health care provider.  If you were prescribed an antibiotic medicine, take it as told by your health care provider. Do not stop taking the antibiotic even if you start to feel better. General instructions  To avoid spreading the infection: ? Do not share personal care items, towels, or hot tubs with others. ? Avoid making skin contact with other people.  Keep all follow-up visits as told by your health care provider. This is important. Contact a health care provider if:  You have more redness, swelling, or pain around your abscess.  You have more fluid or blood coming from your abscess.  Your abscess feels warm to the touch.  You have more pus or a bad smell coming from your abscess.  You have a fever.  You have muscle aches.  You have chills or a general ill feeling. Get help right away if:  You have severe pain.  You see red streaks on your skin spreading away from the abscess. This information is not intended to replace advice given to you by your health care provider. Make sure you discuss any questions you have with your health care provider. Document Released: 08/10/2005 Document Revised: 06/26/2016 Document Reviewed: 09/09/2015 Elsevier Interactive Patient Education  [employer].

## 2018-08-03 NOTE — Progress Notes (Signed)
9/20/20195:28 PM  Nancy Bradshaw 09-Sep-1966, 52 y.o. female [id number]  Chief Complaint  Patient presents with  . Pain    having pain due to possible boil on the vagina. Noticed yesterday.    HPI:   Patient is a 52 y.o. female who presents today for boil in vagina  Has a boil in her vulvar area Started yesterday Very inflamed and painful Has not tried any sitz baths Heavy hiker  Has taken keflex in the past wo any issues  Fall Risk  08/03/2018 04/23/2018 10/18/2017 04/05/2017 08/10/2016  Falls in the past year? No No No No No     Depression screen [facility] 2/9 08/03/2018 04/23/2018 10/18/2017  Decreased Interest 0 0 0  Down, Depressed, Hopeless 0 0 0  PHQ - 2 Score 0 0 0    Allergies  Allergen Reactions  . Azithromycin Nausea And Vomiting  . Codeine Nausea And Vomiting  . Other     Pt prefers not to have pain medication if not necessary- recovering alcoholic.   [place] [Pseudoephedrine Hcl]     "Creepy crawly skin"--if taken consistently  . Zoloft [Sertraline Hcl]     Due to bipolar disorder-makes her manic.   Marland Kitchen Penicillins Rash    Has patient had a PCN reaction causing immediate rash, facial/tongue/throat swelling, SOB or lightheadedness with hypotension: No Has patient had a PCN reaction causing severe rash involving mucus membranes or skin necrosis: No Has patient had a PCN reaction that required hospitalization: No Has patient had a PCN reaction occurring within the last 10 years: No If all of the above answers are "NO", then may proceed with Cephalosporin use.   . Sulfa Antibiotics Rash    Prior to Admission medications   Medication Sig Start Date End Date Taking? Authorizing Provider  lamoTRIgine (LAMICTAL) 200 MG tablet Take 1.5 tablets (300 mg total) by mouth daily. 06/21/18  Yes Charlcie Cradle, MD  lisinopril (PRINIVIL,ZESTRIL) 10 MG tablet Take 1 tablet (10 mg total) by mouth daily. 04/23/18  Yes Wardell Honour, MD  LYSINE PO Take 1 tablet by mouth daily.    Yes [provider]  mirtazapine (REMERON) 45 MG tablet Take 1 tablet (45 mg total) by mouth at bedtime. 06/21/18  Yes Charlcie Cradle, MD  Multiple Vitamin (MULTIVITAMIN) tablet Take 1 tablet by mouth daily.   Yes [provider]  NON FORMULARY Take 1 tablet by mouth daily. "LAJ"   Yes [provider]  Red Yeast Rice Extract (RED YEAST RICE PO) Take 1 tablet by mouth daily.   Yes [provider]  triamcinolone cream (KENALOG) 0.1 % Apply 1 application topically 2 (two) times daily. 07/16/18  Yes Wieters, Hallie C, PA-C  ziprasidone (GEODON) 80 MG capsule Take 1 capsule (80 mg total) by mouth daily. 06/21/18  Yes Charlcie Cradle, MD    Past Medical History:  Diagnosis Date  . Alcohol abuse   . Allergy   . Anxiety Dx 2006  . Bipolar 2 disorder [facility]) Dx 2008   [facility] 2011-2017  . Heart murmur    Dx at age of 38  . Hx of dysmenorrhea 07/20/2012   Seasonale prescribed for severe dysmenorrhea and heavy bleeding with clotting; on OCP, menses lasts 4 days with light bleeding.   Marland Kitchen Hyperlipidemia    Dx at age of 57  . Hypertension Dx 2009  . PTSD (post-traumatic stress disorder) Dx 2008  . Substance abuse ([place])    No alcohol since 2008  Past Surgical History:  Procedure Laterality Date  . APPENDECTOMY  2012  . ENDOMETRIAL ABLATION  02/2014   . TUBAL LIGATION  02/2014     Social History   Tobacco Use  . Smoking status: Former Smoker    Last attempt to quit: 11/14/2001    Years since quitting: 16.7  . Smokeless tobacco: Never Used  Substance Use Topics  . Alcohol use: No    Alcohol/week: 0.0 standard drinks    Comment: In Recovery  since 2006    Family History  Problem Relation Age of Onset  . Hypertension Father   . Hyperlipidemia Father   . Cancer Father 65       CLL  . Anxiety disorder Father   . Depression Father   . Heart disease Father 44       CHF  . Hypertension Maternal Grandmother   . Hypothyroidism Maternal Grandmother          and great aunts   . Cancer Maternal Grandmother 74       on spinal cord, rare   . Hypertension Maternal Grandfather   . Bipolar disorder Paternal Grandmother   . Hypertension Paternal Grandmother   . Hyperlipidemia Paternal Grandmother   . Heart disease Paternal Grandmother   . Chronic fatigue Mother   . ADD / ADHD Daughter   . Anxiety disorder Daughter   . Depression Daughter     ROS Per hpi Neg fever, chills, malaise  OBJECTIVE:  Blood pressure 115/75, pulse 68, temperature 97.8 F (36.6 C), temperature source Oral, height 5\' 5"  (1.651 m), weight 153 lb (69.4 kg), SpO2 96 %. Body mass index is 25.46 kg/m.   Physical Exam Gen: AAOx3, NAD Vulva: a small fluctulant boil on right upper labia majora with minimal surrounding erythema  Discussed treatment options Verbal informed consent obtained Area cleansed with betadine x 2 1/2 cc of 1% lidocaine for topical anesthesia Using an 11 blade a small incision made Purulent fluid expressed Area cleansed and covered with clean dry guaze   ASSESSMENT and PLAN  1. Boil of vulva Discussed supportive measures, new meds r/se/b and RTC precautions. Patient educational handout given. Other orders - doxycycline (VIBRA-TABS) 100 MG tablet; Take 1 tablet (100 mg total) by mouth 2 (two) times daily.  Return if symptoms worsen or fail to improve.    Rutherford Guys, MD Primary [facility] at [facility], [place] [address] Ph.  [phone] Fax [phone]

## 2018-09-16 DIAGNOSIS — Z23 Encounter for immunization: Secondary | ICD-10-CM | POA: Diagnosis not present

## 2018-10-24 ENCOUNTER — Encounter: Payer: Self-pay | Admitting: Family Medicine

## 2018-10-24 ENCOUNTER — Other Ambulatory Visit: Payer: Self-pay

## 2018-10-24 ENCOUNTER — Ambulatory Visit (INDEPENDENT_AMBULATORY_CARE_PROVIDER_SITE_OTHER): Payer: 59 | Admitting: Family Medicine

## 2018-10-24 VITALS — BP 132/82 | HR 64 | Temp 97.8°F | Resp 16 | Ht 65.0 in | Wt 148.6 lb

## 2018-10-24 DIAGNOSIS — Z0001 Encounter for general adult medical examination with abnormal findings: Secondary | ICD-10-CM

## 2018-10-24 DIAGNOSIS — Z1211 Encounter for screening for malignant neoplasm of colon: Secondary | ICD-10-CM

## 2018-10-24 DIAGNOSIS — R42 Dizziness and giddiness: Secondary | ICD-10-CM

## 2018-10-24 DIAGNOSIS — Z5181 Encounter for therapeutic drug level monitoring: Secondary | ICD-10-CM | POA: Diagnosis not present

## 2018-10-24 DIAGNOSIS — I10 Essential (primary) hypertension: Secondary | ICD-10-CM | POA: Diagnosis not present

## 2018-10-24 DIAGNOSIS — Z1239 Encounter for other screening for malignant neoplasm of breast: Secondary | ICD-10-CM

## 2018-10-24 DIAGNOSIS — Z0000 Encounter for general adult medical examination without abnormal findings: Secondary | ICD-10-CM

## 2018-10-24 MED ORDER — LISINOPRIL 10 MG PO TABS: 10.0000 mg | ORAL_TABLET | Freq: Every day | ORAL | 1 refills | Status: DC

## 2018-10-24 NOTE — Patient Instructions (Addendum)
If you have lab work done today you will be contacted with your lab results within the next 2 weeks.  If you have not heard from [place] then please contact us. The fastest way to get your results is to register for My Chart.   IF you received an x-ray today, you will receive an invoice from [facility]. Please contact [facility] at [phone] with questions or concerns regarding your invoice.   IF you received labwork today, you will receive an invoice from [place]. Please contact [facility] at [phone] with questions or concerns regarding your invoice.   Our billing staff will not be able to assist you with questions regarding bills from these companies.  You will be contacted with the lab results as soon as they are available. The fastest way to get your results is to activate your My Chart account. Instructions are located on the last page of this paperwork. If you have not heard from [place] regarding the results in 2 weeks, please contact this office.    We recommend that you schedule a mammogram for breast cancer screening. Typically, you do not need a referral to do this. Please contact a local imaging center to schedule your mammogram.  [facility] - [phone]  *ask for the Radiology Department [facility] ([place]) - [phone] or [phone]  [facility] - [phone] [place] [phone] [facility] - [phone]  *ask for the [facility] - [phone]  *ask for the Radiology Department [facility] - [phone]  *ask for the [facility] - [phone] Cancer Screening for Women A cancer screening is a test or exam that checks for cancer. Your health care provider will recommend specific cancer screenings based on your age, personal history, and family history of cancer. Work with your health care  provider to create a cancer screening schedule that protects your health. Why is cancer screening done? Cancer screening is done to look for cancer in the very early stages, before it spreads and becomes harder to treat and before you would start to notice symptoms. Finding cancer early improves the chances of successful treatment. It may save your life. Who should be screened for cancer? All women should be screened for certain cancers, including breast cancer, cervical cancer, and skin cancer. Your health care provider may recommend screenings for other types of cancer if:  You had cancer before.  You have a family member with cancer.  You have abnormal genes that could increase the risk of cancer.  You have risk factors for certain cancers, such as smoking.  When you should be screened for cancer depends on:  Your age.  Your medical history and your family's medical history.  Certain lifestyle factors, such as smoking.  Environmental exposure, such as to asbestos.  What are some common cancer screenings? Breast cancer Breast cancer screening is done with a test that takes images of breast tissue (mammogram). Here are some screening guidelines:  When you are age 66-44. you will be given the choice to start having mammograms.  When you are age 28-54, you should have a mammogram every year.  You may start having mammograms before age 64 if you have risk factors for breast cancer, such as having an immediate family member with breast cancer.  When you are age 66 or older, you should have a mammogram every 1-2 years for as  long as you are in good health and have a life expectancy of 10 years or more.  It is important to know what your breasts look and feel like so you can report any changes to your health care provider.  Cervical cancer Cervical cancer screening is done with a Pap test. This testchecks for abnormalities, including the virus that causes cervical cancer (human  papillomavirus, or HPV). To perform the test, a health care provider takes a swab of cervical cells during a pelvic exam. Screening for cervical cancer with a Pap test should start at age 43. Here are some screening guidelines:  When you are age 80-29, you should have a Pap test every 3 years.  When you are age 76-65, you should have a Pap test and HPV test every 5 years or have a Pap test every 3 years.  You may be screened for cervical cancer more often if you have risk factors for cervical cancer.  If your Pap tests are abnormal, you may have an HPV test.  If you have had the HPV vaccine, you will still be screened for cervical cancer and follow normal screening recommendations.  You do not need to be screened for cervical cancer if any of the following apply to you:  You are older than age 23 and you have not had a serious cervical precancer or cancer in the last 20 years.  Your cervix and uterus have been removed and you have never had cervical cancer or precancerous cells.  Endometrial cancer There is no standard screening test for endometrial cancer, but the cancer can be detected with:  A test of a sample of tissue taken from the lining of the uterus (endometrial tissue biopsy).  A vaginal ultrasound.  Pap tests.  If you are at increased risk for endometrial cancer, you may need to have these tests more often than normal. You are at increased risk if:  You have a family history of ovarian, uterine, or colon cancer.  You are taking tamoxifen, a drug that is used to treat breast cancer.  You have certain types of colon cancer.  If you have reached menopause, it is especially important to talk with your health care provider about any vaginal bleeding or spotting. Screening for endometrial cancer is not recommended for women who do not have symptoms of the cancer, such as vaginal bleeding. Colorectal cancer Screening for colorectal cancer is recommended starting at age 44 for  most women. If you have a family history of colon or rectal cancer or other risk factors, you may need to start having screenings earlier. Talk with your health care provider about which screening test is right for you and how often you should be screened. Colorectal cancer screening looks for cancer or for growths called polyps that often form before cancer starts. Tests to look for cancer or polyps include:  Colonoscopy or flexible sigmoidoscopy. For these procedures, a flexible tube with a small camera is inserted into the rectum.  CT colonography. This test uses X-rays and a contrast dye to check the colon for polyps. If a polyp is found, you may need to have a colonoscopy so the polyp can be located and removed.  Tests to look for cancer in the stool (feces) include:  Guaiac-based fecal occult blood test (FOBT). This test detects blood in stool. It can be done at home with a kit.  Fecal immunochemical test (FIT). This test detects blood in stool. For this test, you will need to  collect stool samples at home.  Stool DNA test. This test looks for blood in stool and any changes in DNA that can lead to colon cancer. For this test, you will need to collect a stool sample at home and send it to a lab.  Skin cancer Skin cancer screening is done by checking the skin for unusual moles or spots and any changes in existing moles. Your health care provider should check your skin for signs of skin cancer at every physical exam. You should check your skin every month and tell your health care provider right away if anything looks unusual. Women with a higher-than-normal risk for skin cancer may want to see a skin specialist (dermatologist) for an annual body check. Lung cancer Lung cancer screening is done with a CT scan that looks for abnormal cells in the lungs. Discuss lung cancer screening with your health care provider if you are 55-74 years old and if any of the following apply to you:  You  currently smoke.  You used to smoke heavily.  You have had at least a 30-pack-year smoking history.  You have quit smoking within the past 15 years.  If you smoke heavily or if you used to smoke, you may need to be screened every year. Where to find more information:  National Cancer Institute: https://www.cancer.gov/about-cancer/screening  Centers for Disease Control and Prevention: https://www.cdc.gov/cancer/dcpc/prevention/screening.htm  Department of Health and Human Services: https://www.womenshealth.gov/screening-tests-and-vaccines/screening-tests-for-women Contact a health care provider if:  You have concerns about any signs or symptoms of cancer, such as: ? Moles that have an unusual shape or color. ? Changes in existing moles. ? A sore on your skin that does not heal. ? Blood in your stool. ? Fatigue that does not go away. ? Frequent pain or cramping in your abdomen. ? Coughing, or coughing up blood. ? Losing weight without trying. ? Lumps or other changes in your breasts. ? Vaginal bleeding, spotting, or changes in your periods. Summary  Be aware of and watch for signs and symptoms of cancer, especially symptoms of breast cancer, cervical cancer, endometrial cancer, colorectal cancer, skin cancer, and lung cancer.  Early detection of cancer with cancer screening may save your life.  Talk with your health care provider about your specific cancer risks.  Work together with your health care provider to create a cancer screening plan that is right for you. This information is not intended to replace advice given to you by your health care provider. Make sure you discuss any questions you have with your health care provider. Document Released: 07/28/2016 Document Revised: 07/28/2016 Document Reviewed: 07/28/2016 [employer] Interactive Patient Education  2018 [employer]  

## 2018-10-24 NOTE — Progress Notes (Signed)
Chief Complaint  Patient presents with  . Annual Exam    cpe no pap    Subjective:  Nancy Bradshaw is a 52 y.o. female here for a health maintenance visit.  Patient is established pt  Colon Cancer Screening she has never had a colonoscopy she denies blood in his stool, unexpected weight loss or pain with defecation No rectal itching she does not smoke she does not have a family history of colon cancer    Patient Active Problem List   Diagnosis Date Noted  . Esophageal reflux 10/28/2015  . Obesity (BMI 30.0-34.9) 10/28/2015  . PTSD (post-traumatic stress disorder) 10/15/2014  . Hyperlipidemia 10/15/2014  . Family history of hypothyroidism 10/15/2014  . Bipolar disorder ([place]) 06/21/2013  . Personal history of alcoholism ([place]) 07/20/2012  . HTN (hypertension) 07/20/2012    Past Medical History:  Diagnosis Date  . Alcohol abuse   . Allergy   . Anxiety Dx 2006  . Bipolar 2 disorder [facility]) Dx 2008   [facility] 2011-2017  . Heart murmur    Dx at age of 44  . Hx of dysmenorrhea 07/20/2012   Seasonale prescribed for severe dysmenorrhea and heavy bleeding with clotting; on OCP, menses lasts 4 days with light bleeding.   Marland Kitchen Hyperlipidemia    Dx at age of 83  . Hypertension Dx 2009  . PTSD (post-traumatic stress disorder) Dx 2008  . Substance abuse ([place])    No alcohol since 2008    Past Surgical History:  Procedure Laterality Date  . APPENDECTOMY  2012  . ENDOMETRIAL ABLATION  02/2014   . TUBAL LIGATION  02/2014      Outpatient Medications Prior to Visit  Medication Sig Dispense Refill  . lamoTRIgine (LAMICTAL) 200 MG tablet Take 1.5 tablets (300 mg total) by mouth daily. 135 tablet 1  . LYSINE PO Take 1 tablet by mouth daily.    . mirtazapine (REMERON) 45 MG tablet Take 1 tablet (45 mg total) by mouth at bedtime. 90 tablet 1  . Multiple Vitamin (MULTIVITAMIN) tablet Take 1 tablet by mouth daily.    . NON FORMULARY Take 1 tablet by mouth daily. "LAJ"    . Red  Yeast Rice Extract (RED YEAST RICE PO) Take 1 tablet by mouth daily.    . ziprasidone (GEODON) 80 MG capsule Take 1 capsule (80 mg total) by mouth daily. 90 capsule 1  . lisinopril (PRINIVIL,ZESTRIL) 10 MG tablet Take 1 tablet (10 mg total) by mouth daily. 90 tablet 1  . doxycycline (VIBRA-TABS) 100 MG tablet Take 1 tablet (100 mg total) by mouth 2 (two) times daily. (Patient not taking: Reported on 10/24/2018) 20 tablet 0  . triamcinolone cream (KENALOG) 0.1 % Apply 1 application topically 2 (two) times daily. (Patient not taking: Reported on 10/24/2018) 30 g 0   No facility-administered medications prior to visit.     Allergies  Allergen Reactions  . Azithromycin Nausea And Vomiting  . Codeine Nausea And Vomiting  . Other     Pt prefers not to have pain medication if not necessary- recovering alcoholic.   Ebbie [place] [Pseudoephedrine Hcl]     "Creepy crawly skin"--if taken consistently  . Zoloft [Sertraline Hcl]     Due to bipolar disorder-makes her manic.   Marland Kitchen Penicillins Rash    Has patient had a PCN reaction causing immediate rash, facial/tongue/throat swelling, SOB or lightheadedness with hypotension: No Has patient had a PCN reaction causing severe rash involving mucus membranes or skin necrosis: No  Has patient had a PCN reaction that required hospitalization: No Has patient had a PCN reaction occurring within the last 10 years: No If all of the above answers are "NO", then may proceed with Cephalosporin use.   . Sulfa Antibiotics Rash     Family History  Problem Relation Age of Onset  . Hypertension Father   . Hyperlipidemia Father   . Cancer Father 6       CLL  . Anxiety disorder Father   . Depression Father   . Heart disease Father 47       CHF  . Hypertension Maternal Grandmother   . Hypothyroidism Maternal Grandmother        and great aunts   . Cancer Maternal Grandmother 74       on spinal cord, rare   . Hypertension Maternal Grandfather   . Bipolar disorder  Paternal Grandmother   . Hypertension Paternal Grandmother   . Hyperlipidemia Paternal Grandmother   . Heart disease Paternal Grandmother   . Chronic fatigue Mother   . ADD / ADHD Daughter   . Anxiety disorder Daughter   . Depression Daughter      Health Habits: Dental Exam: up to date Eye Exam: up to date Exercise: 4 times/week on average Current exercise activities: walking/running and yoga Diet: balanced   Social History   Socioeconomic History  . Marital status: Divorced    Spouse name: Not on file  . Number of children: 1   . Years of education: grad schoo  . Highest education level: Not on file  Occupational History  . Not on file  Social Needs  . Financial resource strain: Not on file  . Food insecurity:    Worry: Not on file    Inability: Not on file  . Transportation needs:    Medical: Not on file    Non-medical: Not on file  Tobacco Use  . Smoking status: Former Smoker    Last attempt to quit: 11/14/2001    Years since quitting: 16.9  . Smokeless tobacco: Never Used  Substance and Sexual Activity  . Alcohol use: No    Alcohol/week: 0.0 standard drinks    Comment: In Recovery  since 2006  . Drug use: No  . Sexual activity: Not Currently    Birth control/protection: Surgical  Lifestyle  . Physical activity:    Days per week: Not on file    Minutes per session: Not on file  . Stress: Not on file  Relationships  . Social connections:    Talks on phone: Not on file    Gets together: Not on file    Attends religious service: Not on file    Active member of club or organization: Not on file    Attends meetings of clubs or organizations: Not on file    Relationship status: Not on file  . Intimate partner violence:    Fear of current or ex partner: Not on file    Emotionally abused: Not on file    Physically abused: Not on file    Forced sexual activity: Not on file  Other Topics Concern  . Not on file  Social History Narrative   Social Hx:    Current living situation: lives in [place] with mother and daughter   Born and Raised in [place] by both parents. 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   Kids: 1 daughter  (15yo)   Legal issues: denies   recovery since 2006.   Alcoholism age [age]; binge drinking; benzo abuse stopped in 2008. Pt goes to AA 3x/week      Drugs: none; previous marijuna use about 5 times in her life time      Tobacco: quit smoking in 2002; smoked x 25 years      Exercise: no formal exercise in 2018.     Social History   Substance and Sexual Activity  Alcohol Use No  . Alcohol/week: 0.0 standard drinks   Comment: In Recovery  since 2006   Social History   Tobacco Use  Smoking Status Former Smoker  . Last attempt to quit: 11/14/2001  . Years since quitting: 16.9  Smokeless Tobacco Never Used   Social History   Substance and Sexual Activity  Drug Use No    GYN: Sexual Health Menstrual status: regular menses LMP: No LMP recorded. Patient has had an ablation. Last pap smear: see HM section History of abnormal pap smears:    Health Maintenance: See under health Maintenance activity for review of completion dates as well. Immunization History  Administered Date(s) Administered  . Hepatitis A, Adult 03/30/2014, 08/10/2016  . Influenza,inj,Quad PF,6+ Mos 08/18/2014, 09/21/2015, 08/10/2016, 09/20/2017, 09/19/2018  . Influenza-Unspecified 08/14/2017  . Pneumococcal Polysaccharide-23 10/15/2014  . Td 07/17/2002  . Tdap 07/11/2008      Depression Screen-PHQ2/9 Depression screen [facility] 2/9 10/24/2018 08/03/2018 04/23/2018 10/18/2017 04/05/2017  Decreased Interest 0 0 0 0 0  Down, Depressed, Hopeless 0 0 0 0 0  PHQ  - 2 Score 0 0 0 0 0       Depression Severity and Treatment Recommendations:  0-4= None  5-9= Mild / Treatment: Support, educate to call if worse; return in one month  10-14= Moderate / Treatment: Support, watchful waiting; Antidepressant or Psycotherapy  15-19= Moderately severe / Treatment: Antidepressant OR Psychotherapy  >= 20 = Major depression, severe / Antidepressant AND Psychotherapy    Review of Systems   ROS  See HPI for ROS as well.   Review of Systems  Constitutional: Negative for activity change, appetite change, chills and fever.  HENT: Negative for congestion, nosebleeds, trouble swallowing and voice change.   Respiratory: Negative for cough, shortness of breath and wheezing.   Gastrointestinal: Negative for diarrhea, nausea and vomiting.  Genitourinary: Negative for difficulty urinating, dysuria, flank pain and hematuria.  Musculoskeletal: Negative for back pain, joint swelling and neck pain.  Neurological: Negative for dizziness, speech difficulty, light-headedness and numbness.  See HPI. All other review of systems negative.    Objective:   Vitals:   10/24/18 0813  BP: 132/82  Pulse: 64  Resp: 16  Temp: 97.8 F (36.6 C)  TempSrc: Oral  SpO2: 99%  Weight: 148 lb 9.6 oz (67.4 kg)  Height: _0  (1.651 m)    Body mass index is 24.73 kg/m.  Physical Exam  BP 132/82 (BP Location: Right Arm, Patient Position: Sitting, Cuff Size: Normal)   Pulse 64   Temp 97.8 F (36.6 C) (Oral)   Resp 16   Ht _1  (1.651 m)   Wt 148 lb 9.6 oz (67.4 kg)   SpO2 99%   BMI 24.73 kg/m   General Appearance:    Alert,  cooperative, no distress, appears stated age  Head:    Normocephalic, without obvious abnormality, atraumatic  Eyes:    PERRL, conjunctiva/corneas clear, EOM's intact, fundi    benign, both eyes  Ears:    Normal TM's and external ear canals, both ears  Nose:   Nares normal, septum midline, mucosa normal, no drainage    or sinus tenderness    Throat:   Lips, mucosa, and tongue normal; teeth and gums normal  Neck:   Supple, symmetrical, trachea midline, no adenopathy;    thyroid:  no enlargement/tenderness/nodules; no carotid   bruit or JVD  Back:     Symmetric, no curvature, ROM normal, no CVA tenderness  Lungs:     Clear to auscultation bilaterally, respirations unlabored  Chest Wall:    No tenderness or deformity   Heart:    Regular rate and rhythm, S1 and S2 normal, no murmur, rub   or gallop  Breast Exam:    No tenderness, masses, or nipple abnormality  Abdomen:     Soft, non-tender, bowel sounds active all four quadrants,    no masses, no organomegaly  Extremities:   Extremities normal, atraumatic, no cyanosis or edema  Pulses:   2+ and symmetric all extremities  Skin:   Skin color, texture, turgor normal, no rashes or lesions  Lymph nodes:   Cervical, supraclavicular, and axillary nodes normal  Neurologic:   CNII-XII intact, normal strength, sensation and reflexes    throughout       Assessment/Plan:   Patient was seen for a health maintenance exam.  Counseled the patient on health maintenance issues. Reviewed her health mainteance schedule and ordered appropriate tests (see orders.) Counseled on regular exercise and weight management. Recommend regular eye exams and dental cleaning.   The following issues were addressed today for health maintenance:   Gwendelyn was seen today for annual exam.  Diagnoses and all orders for this visit:  Routine physical examination- Women's Health Maintenance Plan Advised monthly breast exam and annual mammogram Advised dental exam every six months Discussed stress management Discussed pap smear screening guidelines    Essential hypertension- Patient's blood pressure is at goal of 139/89 or less. Condition is stable. Continue current medications and treatment plan. I recommend that you exercise for 30-45 minutes 5 days a week. I also recommend a balanced diet with fruits  and vegetables every day, lean meats, and little fried foods. The DASH diet (you can find this online) is a good example of this.  -     lisinopril (PRINIVIL,ZESTRIL) 10 MG tablet; Take 1 tablet (10 mg total) by mouth daily.  Encounter for medication monitoring -     Comprehensive metabolic panel -     Lipid panel  Screen for colon cancer- discussed options for cancer screening  -     Ambulatory referral to Gastroenterology  Screening for breast cancer -     MM Digital Screening; Future  Dizziness- will check for metabolic causes -     TSH -     Hemoglobin A1c -     CBC    Return in about 1 year (around 10/25/2019) for physical and six months for hypertension .    Body mass index is 24.73 kg/m.:  Discussed the patient's BMI with patient. The BMI body mass index is 24.73 kg/m.     Future Appointments  Date Time Provider [place]  12/27/2018  4:15 PM Charlcie Cradle, MD [facility] None    Patient Instructions  If you have lab work done today you will be contacted with your lab results within the next 2 weeks.  If you have not heard from [place] then please contact us. The fastest way to get your results is to register for My Chart.   IF you received an x-ray today, you will receive an invoice from [facility]. Please contact [facility] at [phone] with questions or concerns regarding your invoice.   IF you received labwork today, you will receive an invoice from [facility]. Please contact [facility] at [phone] with questions or concerns regarding your invoice.   Our billing staff will not be able to assist you with questions regarding bills from these companies.  You will be contacted with the lab results as soon as they are available. The fastest way to get your results is to activate your My Chart account. Instructions are located on the last page of this paperwork. If you have not heard from [place] regarding the results in 2 weeks, please  contact this office.    We recommend that you schedule a mammogram for breast cancer screening. Typically, you do not need a referral to do this. Please contact a local imaging center to schedule your mammogram.  [facility] - [phone]  *ask for the Radiology Department [facility] ([place]) - [phone] or [phone]  [facility] - [phone] [place] [phone] [facility] - [phone]  *ask for the [facility] - [phone]  *ask for the Radiology Department [facility] - [phone]  *ask for the [facility] - [phone] Cancer Screening for Women A cancer screening is a test or exam that checks for cancer. Your health care provider will recommend specific cancer screenings based on your age, personal history, and family history of cancer. Work with your health care provider to create a cancer screening schedule that protects your health. Why is cancer screening done? Cancer screening is done to look for cancer in the very early stages, before it spreads and becomes harder to treat and before you would start to notice symptoms. Finding cancer early improves the chances of successful treatment. It may save your life. Who should be screened for cancer? All women should be screened for certain cancers, including breast cancer, cervical cancer, and skin cancer. Your health care provider may recommend screenings for other types of cancer if:  You had cancer before.  You have a family member with cancer.  You have abnormal genes that could increase the risk of cancer.  You have risk factors for certain cancers, such as smoking.  When you should be screened for cancer depends on:  Your age.  Your medical history and your family's medical history.  Certain lifestyle factors, such as smoking.  Environmental exposure,  such as to asbestos.  What are some common cancer screenings? Breast cancer Breast cancer screening is done with a test that takes images of breast tissue (mammogram). Here are some screening guidelines:  When you are age 33-44. you will be given the choice to start having mammograms.  When you are age 21-54, you should have a mammogram every year.  You may start having mammograms before age 26 if you have risk factors for breast cancer, such as having an immediate family member with breast cancer.  When you are age 79 or older, you should have a mammogram every 1-2 years for as long as  you are in good health and have a life expectancy of 10 years or more.  It is important to know what your breasts look and feel like so you can report any changes to your health care provider.  Cervical cancer Cervical cancer screening is done with a Pap test. This testchecks for abnormalities, including the virus that causes cervical cancer (human papillomavirus, or HPV). To perform the test, a health care provider takes a swab of cervical cells during a pelvic exam. Screening for cervical cancer with a Pap test should start at age 68. Here are some screening guidelines:  When you are age 23-29, you should have a Pap test every 3 years.  When you are age 75-65, you should have a Pap test and HPV test every 5 years or have a Pap test every 3 years.  You may be screened for cervical cancer more often if you have risk factors for cervical cancer.  If your Pap tests are abnormal, you may have an HPV test.  If you have had the HPV vaccine, you will still be screened for cervical cancer and follow normal screening recommendations.  You do not need to be screened for cervical cancer if any of the following apply to you:  You are older than age 22 and you have not had a serious cervical precancer or cancer in the last 20 years.  Your cervix and uterus have been removed and you have never had cervical cancer  or precancerous cells.  Endometrial cancer There is no standard screening test for endometrial cancer, but the cancer can be detected with:  A test of a sample of tissue taken from the lining of the uterus (endometrial tissue biopsy).  A vaginal ultrasound.  Pap tests.  If you are at increased risk for endometrial cancer, you may need to have these tests more often than normal. You are at increased risk if:  You have a family history of ovarian, uterine, or colon cancer.  You are taking tamoxifen, a drug that is used to treat breast cancer.  You have certain types of colon cancer.  If you have reached menopause, it is especially important to talk with your health care provider about any vaginal bleeding or spotting. Screening for endometrial cancer is not recommended for women who do not have symptoms of the cancer, such as vaginal bleeding. Colorectal cancer Screening for colorectal cancer is recommended starting at age 41 for most women. If you have a family history of colon or rectal cancer or other risk factors, you may need to start having screenings earlier. Talk with your health care provider about which screening test is right for you and how often you should be screened. Colorectal cancer screening looks for cancer or for growths called polyps that often form before cancer starts. Tests to look for cancer or polyps include:  Colonoscopy or flexible sigmoidoscopy. For these procedures, a flexible tube with a small camera is inserted into the rectum.  CT colonography. This test uses X-rays and a contrast dye to check the colon for polyps. If a polyp is found, you may need to have a colonoscopy so the polyp can be located and removed.  Tests to look for cancer in the stool (feces) include:  Guaiac-based fecal occult blood test (FOBT). This test detects blood in stool. It can be done at home with a kit.  Fecal immunochemical test (FIT). This test detects blood in stool. For this  test, you will need to collect stool  samples at home.  Stool DNA test. This test looks for blood in stool and any changes in DNA that can lead to colon cancer. For this test, you will need to collect a stool sample at home and send it to a lab.  Skin cancer Skin cancer screening is done by checking the skin for unusual moles or spots and any changes in existing moles. Your health care provider should check your skin for signs of skin cancer at every physical exam. You should check your skin every month and tell your health care provider right away if anything looks unusual. Women with a higher-than-normal risk for skin cancer may want to see a skin specialist (dermatologist) for an annual body check. Lung cancer Lung cancer screening is done with a CT scan that looks for abnormal cells in the lungs. Discuss lung cancer screening with your health care provider if you are 61-23 years old and if any of the following apply to you:  You currently smoke.  You used to smoke heavily.  You have had at least a 30-pack-year smoking history.  You have quit smoking within the past 15 years.  If you smoke heavily or if you used to smoke, you may need to be screened every year. Where to find more information:  [place]: SkinPromotion.no  Centers for Disease Control and Prevention: http://knight-sullivan.biz/  Department of Health and Human Services: BankingDetective.si Contact a health care provider if:  You have concerns about any signs or symptoms of cancer, such as: ? Moles that have an unusual shape or color. ? Changes in existing moles. ? A sore on your skin that does not heal. ? Blood in your stool. ? Fatigue that does not go away. ? Frequent pain or cramping in your abdomen. ? Coughing, or coughing up blood. ? Losing weight without trying. ? Lumps or  other changes in your breasts. ? Vaginal bleeding, spotting, or changes in your periods. Summary  Be aware of and watch for signs and symptoms of cancer, especially symptoms of breast cancer, cervical cancer, endometrial cancer, colorectal cancer, skin cancer, and lung cancer.  Early detection of cancer with cancer screening may save your life.  Talk with your health care provider about your specific cancer risks.  Work together with your health care provider to create a cancer screening plan that is right for you. This information is not intended to replace advice given to you by your health care provider. Make sure you discuss any questions you have with your health care provider. Document Released: 07/28/2016 Document Revised: 07/28/2016 Document Reviewed: 07/28/2016 Elsevier Interactive Patient Education  [employer].

## 2018-10-25 ENCOUNTER — Other Ambulatory Visit: Payer: Self-pay | Admitting: Family Medicine

## 2018-10-25 LAB — COMPREHENSIVE METABOLIC PANEL
ALBUMIN: 5 g/dL (ref 3.5–5.5)
ALT: 11 IU/L (ref 0–32)
AST: 16 IU/L (ref 0–40)
Albumin/Globulin Ratio: 2.5 — ABNORMAL HIGH (ref 1.2–2.2)
Alkaline Phosphatase: 77 IU/L (ref 39–117)
BILIRUBIN TOTAL: 0.4 mg/dL (ref 0.0–1.2)
BUN/Creatinine Ratio: 13 (ref 9–23)
BUN: 12 mg/dL (ref 6–24)
CALCIUM: 9.8 mg/dL (ref 8.7–10.2)
CHLORIDE: 101 mmol/L (ref 96–106)
CO2: 24 mmol/L (ref 20–29)
Creatinine, Ser: 0.9 mg/dL (ref 0.57–1.00)
GFR calc Af Amer: 86 mL/min/{1.73_m2} (ref >59)
GFR calc non Af Amer: 75 mL/min/{1.73_m2} (ref >59)
GLOBULIN, TOTAL: 2 g/dL (ref 1.5–4.5)
Glucose: 83 mg/dL (ref 65–99)
Potassium: 4.1 mmol/L (ref 3.5–5.2)
Sodium: 142 mmol/L (ref 134–144)
Total Protein: 7 g/dL (ref 6.0–8.5)

## 2018-10-25 LAB — LIPID PANEL
CHOL/HDL RATIO: 4 ratio (ref 0.0–4.4)
Cholesterol, Total: 332 mg/dL — ABNORMAL HIGH (ref 100–199)
HDL: 82 mg/dL (ref >39)
LDL Calculated: 232 mg/dL — ABNORMAL HIGH (ref 0–99)
TRIGLYCERIDES: 91 mg/dL (ref 0–149)
VLDL CHOLESTEROL CAL: 18 mg/dL (ref 5–40)

## 2018-10-25 LAB — HEMOGLOBIN A1C
Est. average glucose Bld gHb Est-mCnc: 111 mg/dL
HEMOGLOBIN A1C: 5.5 % (ref 4.8–5.6)

## 2018-10-25 LAB — CBC
HEMATOCRIT: 40.4 % (ref 34.0–46.6)
Hemoglobin: 13.6 g/dL (ref 11.1–15.9)
MCH: 30.6 pg (ref 26.6–33.0)
MCHC: 33.7 g/dL (ref 31.5–35.7)
MCV: 91 fL (ref 79–97)
PLATELETS: 250 10*3/uL (ref 150–450)
RBC: 4.44 x10E6/uL (ref 3.77–5.28)
RDW: 12.4 % (ref 12.3–15.4)
WBC: 5.1 10*3/uL (ref 3.4–10.8)

## 2018-10-25 LAB — TSH: TSH: 2.01 u[IU]/mL (ref 0.450–4.500)

## 2018-10-25 MED ORDER — ATORVASTATIN CALCIUM 10 MG PO TABS: 10.0000 mg | ORAL_TABLET | Freq: Every day | ORAL | 3 refills | Status: DC

## 2018-12-11 ENCOUNTER — Ambulatory Visit (HOSPITAL_COMMUNITY)
Admission: EM | Admit: 2018-12-11 | Discharge: 2018-12-11 | Disposition: A | Discharge status: Home / Self Care | Payer: 59 | Attending: Family Medicine | Admitting: Family Medicine

## 2018-12-11 ENCOUNTER — Encounter (HOSPITAL_COMMUNITY): Payer: Self-pay | Admitting: Emergency Medicine

## 2018-12-11 DIAGNOSIS — J111 Influenza due to unidentified influenza virus with other respiratory manifestations: Secondary | ICD-10-CM

## 2018-12-11 DIAGNOSIS — R69 Illness, unspecified: Secondary | ICD-10-CM | POA: Insufficient documentation

## 2018-12-11 MED ORDER — OSELTAMIVIR PHOSPHATE 75 MG PO CAPS: 75.0000 mg | ORAL_CAPSULE | Freq: Two times a day (BID) | ORAL | 0 refills | Status: AC

## 2018-12-11 MED ORDER — BENZONATATE 200 MG PO CAPS: 200.0000 mg | ORAL_CAPSULE | Freq: Three times a day (TID) | ORAL | 0 refills | Status: AC | PRN

## 2018-12-11 NOTE — ED Provider Notes (Signed)
[facility]    CSN: [id number] Arrival date & time: 12/11/18  1458     History   Chief Complaint Chief Complaint  Patient presents with  . Influenza    HPI SHARECE FLEISCHHACKER is a 53 y.o. female history of hypertension, hyperlipidemia, previous alcohol abuse, presenting today for evaluation of URI symptoms.  Patient states that beginning last night she began to feel poor.  She started to have body aches, cough and hot and cold chills.  Symptoms have persisted into this morning.  She noted fever of 100.7 today.  She is also had nasal congestion and a scratchy throat.  She has not taken anything for symptoms.  Does not like to take anything containing alcohol as she is a recovering alcoholic.  She denies any associated GI upset.  Had flu shot this year.  HPI  Past Medical History:  Diagnosis Date  . Alcohol abuse   . Allergy   . Anxiety Dx 2006  . Bipolar 2 disorder [facility]) Dx 2008   [facility] 2011-2017  . Heart murmur    Dx at age of 68  . Hx of dysmenorrhea 07/20/2012   Seasonale prescribed for severe dysmenorrhea and heavy bleeding with clotting; on OCP, menses lasts 4 days with light bleeding.   Marland Kitchen Hyperlipidemia    Dx at age of 72  . Hypertension Dx 2009  . PTSD (post-traumatic stress disorder) Dx 2008  . Substance abuse ([place])    No alcohol since 2008    Patient Active Problem List   Diagnosis Date Noted  . Esophageal reflux 10/28/2015  . Obesity (BMI 30.0-34.9) 10/28/2015  . PTSD (post-traumatic stress disorder) 10/15/2014  . Hyperlipidemia 10/15/2014  . Family history of hypothyroidism 10/15/2014  . Bipolar disorder ([place]) 06/21/2013  . Personal history of alcoholism ([place]) 07/20/2012  . HTN (hypertension) 07/20/2012    Past Surgical History:  Procedure Laterality Date  . APPENDECTOMY  2012  . ENDOMETRIAL ABLATION  02/2014   . TUBAL LIGATION  02/2014     OB History    Gravida  1   Para      Term      Preterm      AB      Living  1     SAB      TAB      Ectopic      Multiple      Live Births           Obstetric Comments  Pre-eclampsia          Home Medications    Prior to Admission medications   Medication Sig Start Date End Date Taking? Authorizing Provider  atorvastatin (LIPITOR) 10 MG tablet Take 1 tablet (10 mg total) by mouth at bedtime. 10/25/18   Forrest Moron, MD  benzonatate (TESSALON) 200 MG capsule Take 1 capsule (200 mg total) by mouth 3 (three) times daily as needed for up to 7 days for cough. 12/11/18 12/18/18  Tarquin Welcher C, PA-C  lamoTRIgine (LAMICTAL) 200 MG tablet Take 1.5 tablets (300 mg total) by mouth daily. 06/21/18   Charlcie Cradle, MD  lisinopril (PRINIVIL,ZESTRIL) 10 MG tablet Take 1 tablet (10 mg total) by mouth daily. 10/24/18   Forrest Moron, MD  LYSINE PO Take 1 tablet by mouth daily.    [provider]  mirtazapine (REMERON) 45 MG tablet Take 1 tablet (45 mg total) by mouth at bedtime. 06/21/18   Charlcie Cradle, MD  Multiple Vitamin (MULTIVITAMIN)  tablet Take 1 tablet by mouth daily.    [provider]  NON FORMULARY Take 1 tablet by mouth daily. "LAJ"    [provider]  oseltamivir (TAMIFLU) 75 MG capsule Take 1 capsule (75 mg total) by mouth 2 (two) times daily for 5 days. 12/11/18 12/16/18  Teddy Rebstock C, PA-C  Red Yeast Rice Extract (RED YEAST RICE PO) Take 1 tablet by mouth daily.    [provider]  ziprasidone (GEODON) 80 MG capsule Take 1 capsule (80 mg total) by mouth daily. 06/21/18   Charlcie Cradle, MD    Family History Family History  Problem Relation Age of Onset  . Hypertension Father   . Hyperlipidemia Father   . Cancer Father 2       CLL  . Anxiety disorder Father   . Depression Father   . Heart disease Father 36       CHF  . Hypertension Maternal Grandmother   . Hypothyroidism Maternal Grandmother        and great aunts   . Cancer Maternal Grandmother 74       on spinal cord, rare   . Hypertension  Maternal Grandfather   . Bipolar disorder Paternal Grandmother   . Hypertension Paternal Grandmother   . Hyperlipidemia Paternal Grandmother   . Heart disease Paternal Grandmother   . Chronic fatigue Mother   . ADD / ADHD Daughter   . Anxiety disorder Daughter   . Depression Daughter     Social History Social History   Tobacco Use  . Smoking status: Former Smoker    Last attempt to quit: 11/14/2001    Years since quitting: 17.0  . Smokeless tobacco: Never Used  Substance Use Topics  . Alcohol use: No    Alcohol/week: 0.0 standard drinks    Comment: In Recovery  since 2006  . Drug use: No     Allergies   Azithromycin; Codeine; Other; Sudafed [pseudoephedrine hcl]; Zoloft [sertraline hcl]; Penicillins; and Sulfa antibiotics   Review of Systems Review of Systems  Constitutional: Positive for appetite change, chills, fatigue and fever. Negative for activity change.  HENT: Positive for congestion, rhinorrhea and sore throat. Negative for ear pain, sinus pressure and trouble swallowing.   Eyes: Negative for discharge and redness.  Respiratory: Positive for cough. Negative for chest tightness and shortness of breath.   Cardiovascular: Negative for chest pain.  Gastrointestinal: Negative for abdominal pain, diarrhea, nausea and vomiting.  Musculoskeletal: Negative for myalgias.  Skin: Negative for rash.  Neurological: Negative for dizziness, light-headedness and headaches.     Physical Exam Triage Vital Signs ED Triage Vitals  Enc Vitals Group     BP 12/11/18 1522 (!) 145/67     Pulse Rate 12/11/18 1522 80     Resp 12/11/18 1522 18     Temp 12/11/18 1522 (!) 100.4 F (38 C)     Temp Source 12/11/18 1522 Oral     SpO2 12/11/18 1522 100 %     Weight --      Height --      Head Circumference --      Peak Flow --      Pain Score 12/11/18 1525 5     Pain Loc --      Pain Edu? --      Excl. in [place]? --    No data found.  Updated Vital Signs BP (!) 145/67 (BP  Location: Left Arm)   Pulse 80   Temp (!) 100.4  F (38 C) (Oral)   Resp 18   SpO2 100%   Visual Acuity Right Eye Distance:   Left Eye Distance:   Bilateral Distance:    Right Eye Near:   Left Eye Near:    Bilateral Near:     Physical Exam Vitals signs and nursing note reviewed.  Constitutional:      General: She is not in acute distress.    Appearance: She is well-developed.  HENT:     Head: Normocephalic and atraumatic.     Ears:     Comments: Bilateral ears without tenderness to palpation of external auricle, tragus and mastoid, EAC's without erythema or swelling, TM's with good bony landmarks and cone of light. Non erythematous.    Nose:     Comments: Mildly erythematous nasal mucosa, mildly swollen turbinates    Mouth/Throat:     Comments: Oral mucosa pink and moist, no tonsillar enlargement or exudate. Posterior pharynx patent and erythematous, no uvula deviation or swelling. Normal phonation. Eyes:     Conjunctiva/sclera: Conjunctivae normal.  Neck:     Musculoskeletal: Neck supple.  Cardiovascular:     Rate and Rhythm: Normal rate and regular rhythm.     Heart sounds: No murmur.  Pulmonary:     Effort: Pulmonary effort is normal. No respiratory distress.     Breath sounds: Normal breath sounds.     Comments: Breathing comfortably at rest, CTABL, no wheezing, rales or other adventitious sounds auscultated Abdominal:     Palpations: Abdomen is soft.     Tenderness: There is no abdominal tenderness.  Skin:    General: Skin is warm and dry.  Neurological:     Mental Status: She is alert.      UC Treatments / Results  Labs (all labs ordered are listed, but only abnormal results are displayed) Labs Reviewed - No data to display  EKG None  Radiology No results found.  Procedures Procedures (including critical care time)  Medications Ordered in UC Medications - No data to display  Initial Impression / Assessment and Plan / UC Course  I have reviewed  the triage vital signs and the nursing notes.  Pertinent labs & imaging results that were available during my care of the patient were reviewed by me and considered in my medical decision making (see chart for details).     Low-grade fever with acute onset of URI symptoms and body aches.  Most likely influenza, will initiate Tamiflu given less than 48 hours of symptoms.  Also recommend further symptomatic and supportive care likely viral etiology.  Lungs clear, do not suspect underlying pneumonia at this time.Discussed strict return precautions. Patient verbalized understanding and is agreeable with plan.  Final Clinical Impressions(s) / UC Diagnoses   Final diagnoses:  Influenza-like illness     Discharge Instructions     Your symptoms are suggestive of the flu.  Please begin taking Tamiflu twice daily for the next 5 days.  Follow-up if symptoms not resolving as would expected or fever persisting through the weekend.  1. Take a daily allergy pill/anti-histamine like Zyrtec, Claritin, or Store brand consistently for 2 weeks for help with congestion/drainage  2. For congestion you may try an oral decongestant like Mucinex or sudafed. You may also try intranasal flonase nasal spray or saline irrigations (neti pot, sinus cleanse)  3. For your sore throat you may try cepacol lozenges, salt water gargles, throat spray. Treatment of congestion may also help your sore throat.  4. For cough  you may try Tessalon, Robitussen, Mucinex DM  5. Take Tylenol or Ibuprofen to help with pain/inflammation  6. Stay hydrated, drink plenty of fluids to keep throat coated and less irritated  Honey Tea For cough/sore throat try using a honey-based tea. Use 3 teaspoons of honey with juice squeezed from half lemon. Place shaved pieces of ginger into 1/2-1 cup of water and warm over stove top. Then mix the ingredients and repeat every 4 hours as needed.   ED Prescriptions    Medication Sig Dispense Auth.  Provider   oseltamivir (TAMIFLU) 75 MG capsule Take 1 capsule (75 mg total) by mouth 2 (two) times daily for 5 days. 10 capsule Kiya Eno C, PA-C   benzonatate (TESSALON) 200 MG capsule Take 1 capsule (200 mg total) by mouth 3 (three) times daily as needed for up to 7 days for cough. 28 capsule Isay Perleberg C, PA-C     Controlled Substance Prescriptions [place] Controlled Substance Registry consulted? Not Applicable   [name], [place] 12/11/18 1544

## 2018-12-11 NOTE — Discharge Instructions (Signed)
Your symptoms are suggestive of the flu.  Please begin taking Tamiflu twice daily for the next 5 days.  Follow-up if symptoms not resolving as would expected or fever persisting through the weekend.  1. Take a daily allergy pill/anti-histamine like Zyrtec, Claritin, or Store brand consistently for 2 weeks for help with congestion/drainage  2. For congestion you may try an oral decongestant like Mucinex or sudafed. You may also try intranasal flonase nasal spray or saline irrigations (neti pot, sinus cleanse)  3. For your sore throat you may try cepacol lozenges, salt water gargles, throat spray. Treatment of congestion may also help your sore throat.  4. For cough you may try Tessalon, Robitussen, Mucinex DM  5. Take Tylenol or Ibuprofen to help with pain/inflammation  6. Stay hydrated, drink plenty of fluids to keep throat coated and less irritated  Honey Tea For cough/sore throat try using a honey-based tea. Use 3 teaspoons of honey with juice squeezed from half lemon. Place shaved pieces of ginger into 1/2-1 cup of water and warm over stove top. Then mix the ingredients and repeat every 4 hours as needed.

## 2018-12-11 NOTE — ED Triage Notes (Signed)
Pt here for flu like sx x 2 days

## 2018-12-25 ENCOUNTER — Other Ambulatory Visit (HOSPITAL_COMMUNITY): Payer: Self-pay | Admitting: Psychiatry

## 2018-12-25 DIAGNOSIS — F411 Generalized anxiety disorder: Secondary | ICD-10-CM

## 2018-12-25 DIAGNOSIS — F3181 Bipolar II disorder: Secondary | ICD-10-CM

## 2018-12-25 DIAGNOSIS — F4310 Post-traumatic stress disorder, unspecified: Secondary | ICD-10-CM

## 2018-12-27 ENCOUNTER — Other Ambulatory Visit (HOSPITAL_COMMUNITY): Payer: Self-pay | Admitting: Psychiatry

## 2018-12-27 ENCOUNTER — Ambulatory Visit (HOSPITAL_BASED_OUTPATIENT_CLINIC_OR_DEPARTMENT_OTHER): Payer: 59 | Admitting: Psychiatry

## 2018-12-27 DIAGNOSIS — F3181 Bipolar II disorder: Secondary | ICD-10-CM

## 2018-12-27 DIAGNOSIS — F4310 Post-traumatic stress disorder, unspecified: Secondary | ICD-10-CM

## 2018-12-27 DIAGNOSIS — F411 Generalized anxiety disorder: Secondary | ICD-10-CM

## 2018-12-27 MED ORDER — ZIPRASIDONE HCL 80 MG PO CAPS: 80.0000 mg | ORAL_CAPSULE | Freq: Every day | ORAL | 1 refills | Status: DC

## 2018-12-27 MED ORDER — LAMOTRIGINE 200 MG PO TABS: 300.0000 mg | ORAL_TABLET | Freq: Every day | ORAL | 1 refills | Status: DC

## 2018-12-27 MED ORDER — MIRTAZAPINE 45 MG PO TABS: 45.0000 mg | ORAL_TABLET | Freq: Every day | ORAL | 1 refills | Status: DC

## 2018-12-27 NOTE — Progress Notes (Signed)
BH MD/PA/NP OP Progress Note  12/27/2018 4:30 PM Nancy Bradshaw  MRN:  [id number]    HPI: She states she is doing well.  She is now certified and licensed.  She is very proud of her accomplishment and states that she has celebrated at home, with friends and at work.  She very much enjoyed her trip with the church backpacking in the [employer].  She wants to continue to do so in the future.  Dub Mikes has been walking almost daily and will often use a 20 pound fast to maintain her strength.  She denies depression, anhedonia, hopelessness.  Sleep is good.  She denies SI/HI.  She denies any manic or hypomanic-like symptoms.  She states that she is productive and enjoys her work.  She has a very good support system.  She has been sober from alcohol for 10 years and does not smoke.  She denies any anxiety or PTSD symptoms at this time.  She feels her current medications keep her stable and would like to continue.  She denies any side effects.      Past Psychiatric History:  Dx:  Post partum depression after first daughter was born in 2003. Bipolar II in 2005; PTSD dx in 2008; Alcohol use disorder in remission Meds: Zoloft- took it for 3 days and it causes severe suicidal ideations; Paxil- caused hypomania; Ativan; Xanax; Lamictal Previous psychiatrist/therapist: Dr. Paula Libra; prior to that Dr. Debbe Bales Hospitalizations: [facility] in 2004 after starting Zoloft for 4 days SIB: denies Suicide attempts: denies but does have hx of SI, last time seriously was in 2008 Hx of violent behavior towards others: denies Current access to guns: denies Hx of abuse: yes see PTSD [employer] Hx: denies Hx of Seizures: denies Hx of TBI: denies     Previous Psychotropic Medications: Yes     Consequences of Substance Abuse: Medical Consequences:  Pt has been sober from alcohol since 2006 and benzos since 2008. Reports she goes to [place] meeting 3x/week and has sponser. Pt is sponsering 2 women right now.  Pt denies any hx of DUI/DWI and detox/rehab. Pt reports she was binge drinker and would often black out.  Legal Consequences:  denies Family Consequences:  broken relationships Blackouts:  yes DT's: denies Withdrawal Symptoms:   Nausea Tremors Vomiting    Past Medical History:  Past Medical History:  Diagnosis Date   Alcohol abuse    Allergy    Anxiety Dx 2006   Bipolar 2 disorder [facility]) Dx 2008   [facility] Bradshaw 2011-2017   Heart murmur    Dx at age of 20   Hx of dysmenorrhea 07/20/2012   Seasonale prescribed for severe dysmenorrhea and heavy bleeding with clotting; on OCP, menses lasts 4 days with light bleeding.    Hyperlipidemia    Dx at age of 36   Hypertension Dx 2009   PTSD (post-traumatic stress disorder) Dx 2008   Substance abuse ([place])    No alcohol since 2008    Past Surgical History:  Procedure Laterality Date   APPENDECTOMY  2012   ENDOMETRIAL ABLATION  02/2014    TUBAL LIGATION  02/2014     Family Psychiatric History:   Family History  Problem Relation Age of Onset   Hypertension Father    Hyperlipidemia Father    Cancer Father 72       CLL   Anxiety disorder Father    Depression Father    Heart disease Father 81  CHF   Hypertension Maternal Grandmother    Hypothyroidism Maternal Grandmother        and great aunts    Cancer Maternal Grandmother 74       on spinal cord, rare    Hypertension Maternal Grandfather    Bipolar disorder Paternal Grandmother    Hypertension Paternal Grandmother    Hyperlipidemia Paternal Grandmother    Heart disease Paternal Grandmother    Chronic fatigue Mother    ADD / ADHD Daughter    Anxiety disorder Daughter    Depression Daughter     Social History:  Social History   Socioeconomic History   Marital status: Divorced    Spouse name: Not on file   Number of children: 1    Years of education: grad schoo   Highest education level: Not on file  Occupational History   Not on file  Social Needs    Financial resource strain: Not on file   Food insecurity:    Worry: Not on file    Inability: Not on file   Transportation needs:    Medical: Not on file    Non-medical: Not on file  Tobacco Use   Smoking status: Former Smoker    Last attempt to quit: 11/14/2001    Years since quitting: 17.1   Smokeless tobacco: Never Used  Substance and Sexual Activity   Alcohol use: No    Alcohol/week: 0.0 standard drinks    Comment: In Recovery  since 2006   Drug use: No   Sexual activity: Not Currently    Birth control/protection: Surgical  Lifestyle   Physical activity:    Days per week: Not on file    Minutes per session: Not on file   Stress: Not on file  Relationships   Social connections:    Talks on phone: Not on file    Gets together: Not on file    Attends religious service: Not on file    Active member of club or organization: Not on file    Attends meetings of clubs or organizations: Not on file    Relationship status: Not on file  Other Topics Concern   Not on file  Social History Narrative   Social Hx:   Current living situation: lives in [place] with mother and daughter   Born and Raised in [place] by both parents. 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   Kids: 1 daughter  (15yo)   Legal issues: denies   recovery since 2006.   Alcoholism age [age]; binge drinking; benzo abuse stopped in 2008. Pt goes to AA 3x/week      Drugs: none; previous marijuna use about 5 times in her life time      Tobacco: quit smoking in 2002; smoked x 25 years      Exercise: no formal exercise in 2018.      Allergies:  Allergies  Allergen Reactions   Azithromycin Nausea And Vomiting   Codeine Nausea  And Vomiting   Other     Pt prefers not to have pain medication if not necessary- recovering alcoholic.    Sudafed [Pseudoephedrine Hcl]     "Creepy crawly skin"--if taken consistently  Zoloft [Sertraline Hcl]     Due to bipolar disorder-makes her manic.    Penicillins Rash    Has patient had a PCN reaction causing immediate rash, facial/tongue/throat swelling, SOB or lightheadedness with hypotension: No Has patient had a PCN reaction causing severe rash involving mucus membranes or skin necrosis: No Has patient had a PCN reaction that required hospitalization: No Has patient had a PCN reaction occurring within the last 10 years: No If all of the above answers are "NO", then may proceed with Cephalosporin use.    Sulfa Antibiotics Rash    Metabolic Disorder Labs: Lab Results  Component Value Date   HGBA1C 5.5 10/24/2018   MPG 100 08/10/2016   Lab Results  Component Value Date   PROLACTIN 11.4 10/18/2017   Lab Results  Component Value Date   CHOL 332 (H) 10/24/2018   TRIG 91 10/24/2018   HDL 82 10/24/2018   CHOLHDL 4.0 10/24/2018   VLDL 22 08/10/2016   LDLCALC 232 (H) 10/24/2018   LDLCALC 193 (H) 04/23/2018   Lab Results  Component Value Date   TSH 2.010 10/24/2018   TSH 2.090 04/23/2018    Therapeutic Level Labs: No results found for: LITHIUM No results found for: VALPROATE No components found for:  CBMZ  Current Medications: Current Outpatient Medications  Medication Sig Dispense Refill   lamoTRIgine (LAMICTAL) 200 MG tablet Take 1.5 tablets (300 mg total) by mouth daily. 135 tablet 1   lisinopril (PRINIVIL,ZESTRIL) 10 MG tablet Take 1 tablet (10 mg total) by mouth daily. 90 tablet 1   LYSINE PO Take 1 tablet by mouth daily.     mirtazapine (REMERON) 45 MG tablet Take 1 tablet (45 mg total) by mouth at bedtime. 90 tablet 1   Multiple Vitamin (MULTIVITAMIN) tablet Take 1 tablet by mouth daily.     NON FORMULARY Take 1 tablet by mouth daily. "LAJ"     Red  Yeast Rice Extract (RED YEAST RICE PO) Take 1 tablet by mouth daily.     ziprasidone (GEODON) 80 MG capsule Take 1 capsule (80 mg total) by mouth daily. 90 capsule 1   atorvastatin (LIPITOR) 10 MG tablet Take 1 tablet (10 mg total) by mouth at bedtime. (Patient not taking: Reported on 12/27/2018) 90 tablet 3   No current facility-administered medications for this visit.      Musculoskeletal: Strength & Muscle Tone: within normal limits Gait & Station: normal Patient leans: N/A  Psychiatric Specialty Exam: Review of Systems  Respiratory:  Negative for cough, sputum production and shortness of breath.   Psychiatric/Behavioral:  Negative for depression, hallucinations and suicidal ideas. The patient is not nervous/anxious and does not have insomnia.    Height 5\' 5"  (1.651 m), weight 146 lb (66.2 kg).Body mass index is 24.3 kg/m.  General Appearance: Fairly Groomed and Neat  Eye Contact:  Good  Speech:  Clear and Coherent and Normal Rate  Volume:  Normal  Mood:  Euthymic  Affect:  Full Range  Thought Process:  Goal Directed, Linear, and Descriptions of Associations: Intact  Orientation:  Full (Time, Place, and Person)  Thought Content:  Logical  Suicidal Thoughts:  No  Homicidal Thoughts:  No  Memory:  Immediate;   Good  Judgement:  Good  Insight:  Good  Psychomotor Activity:  Normal  Concentration:  Concentration: Good  Recall:  Good  Fund of Knowledge:  Good  Language:  Good  Akathisia:  No  Handed:  Right  AIMS (if indicated):  Assets:  Communication Skills Desire for Improvement Financial Resources/Insurance Housing Leisure Time Resilience Social Support Talents/Skills Transportation Vocational/Educational  ADL's:  Intact  Cognition:  WNL  Sleep:          Screenings: PHQ2-9      Office Visit from 10/24/2018 in [facility] from 08/03/2018 in [facility] at [facility] from 04/23/2018 in [facility]  from 10/18/2017 in [facility] at [facility] from 04/05/2017 in [facility] at [facility] Total Score  0  0  0  0  0        Assessment and Plan: Bipolar II d/o; PTSD; GAD; Alcohol use disorder in remission      Medication management with supportive therapy. Risks/benefits and SE of the medication discussed. Pt verbalized understanding and verbal consent obtained for treatment.  Affirm with the patient that the medications are taken as ordered. Patient expressed understanding of how their medications were to be used.    Status of current symptoms: stable  Meds:  Geodon 80mg  po qD for Bipolar disorder Remeron 45mg  po q D for mood and anxiety Lamictal 300mg  po qD for Bipolar disorder   Labs: reviewed labs done 10/24/2018 EKG at PCP is upcoming in Dec  She is working with her PCP regarding health.  Therapy: brief supportive therapy provided. Discussed psychosocial stressors in detail.       Consultations: Encouraged to follow up with therapist Encouraged to follow up with PCP as needed   Pt denies SI and is at an acute low risk for suicide. Patient told to call clinic if any problems occur. Patient advised to go to ER if they should develop SI/HI, side effects, or if symptoms worsen. Has crisis numbers to call if needed. Pt verbalized understanding.   F/up in 6 months or sooner if needed   Charlcie Cradle, MD 12/27/2018, 4:30 PM

## 2019-01-01 ENCOUNTER — Telehealth: Payer: Self-pay | Admitting: Family Medicine

## 2019-01-01 NOTE — Telephone Encounter (Signed)
Called and spoke with pt regarding their appt with Dr. Nolon Rod on 6/8. Dr Nolon Rod is out of the office that day. I was able to get pt rescheduled for 6/1 with Dr. Nolon Rod. I advised of time and late policy. Pt acknowledged.

## 2019-01-10 ENCOUNTER — Encounter: Payer: Self-pay | Admitting: Family Medicine

## 2019-01-10 ENCOUNTER — Encounter: Payer: Self-pay | Admitting: Emergency Medicine

## 2019-01-10 ENCOUNTER — Ambulatory Visit (INDEPENDENT_AMBULATORY_CARE_PROVIDER_SITE_OTHER): Payer: 59 | Admitting: Emergency Medicine

## 2019-01-10 ENCOUNTER — Other Ambulatory Visit: Payer: Self-pay

## 2019-01-10 VITALS — BP 119/75 | HR 60 | Temp 97.8°F | Ht 64.0 in | Wt 143.6 lb

## 2019-01-10 DIAGNOSIS — R42 Dizziness and giddiness: Secondary | ICD-10-CM | POA: Diagnosis not present

## 2019-01-10 NOTE — Progress Notes (Addendum)
Nancy Bradshaw 53 y.o.   Chief Complaint  Patient presents with  . Dizziness    hx of dizziness for 4-5 months. (dont feel like self) (worse since this morning)    HISTORY OF PRESENT ILLNESS: This is a 53 y.o. female complaining of intermittent dizziness for the past 4 to 5 months.  This morning had an episode shortly after getting up that lasted hours as opposed to the usual seconds.  Patient has a healthy lifestyle with no significant changes in the past several months.  No new medications.  Recently entered her menopause.  Dizziness is related with position but no other associated symptoms.  Patient is a Education officer, museum at a methadone clinic.  Increase stress at work.  Very physically active meditates every day.  Pays good attention to hydration, nutrition, resting.  Non-smoker and no EtOH user.  HPI   Prior to Admission medications   Medication Sig Start Date End Date Taking? Authorizing Provider  lamoTRIgine (LAMICTAL) 200 MG tablet Take 1.5 tablets (300 mg total) by mouth daily. 12/27/18  Yes Charlcie Cradle, MD  lisinopril (PRINIVIL,ZESTRIL) 10 MG tablet Take 1 tablet (10 mg total) by mouth daily. 10/24/18  Yes Stallings, Zoe A, MD  LYSINE PO Take 1 tablet by mouth daily.   Yes [provider]  mirtazapine (REMERON) 45 MG tablet Take 1 tablet (45 mg total) by mouth at bedtime. 12/27/18  Yes Charlcie Cradle, MD  Multiple Vitamin (MULTIVITAMIN) tablet Take 1 tablet by mouth daily.   Yes [provider]  Red Yeast Rice Extract (RED YEAST RICE PO) Take 1 tablet by mouth daily.   Yes [provider]  ziprasidone (GEODON) 80 MG capsule Take 1 capsule (80 mg total) by mouth daily. 12/27/18  Yes Charlcie Cradle, MD    Allergies  Allergen Reactions  . Azithromycin Nausea And Vomiting  . Codeine Nausea And Vomiting  . Other     Pt prefers not to have pain medication if not necessary- recovering alcoholic.   [place] [Pseudoephedrine Hcl]     "Creepy crawly  skin"--if taken consistently  . Zoloft [Sertraline Hcl]     Due to bipolar disorder-makes her manic.   Marland Kitchen Penicillins Rash    Has patient had a PCN reaction causing immediate rash, facial/tongue/throat swelling, SOB or lightheadedness with hypotension: No Has patient had a PCN reaction causing severe rash involving mucus membranes or skin necrosis: No Has patient had a PCN reaction that required hospitalization: No Has patient had a PCN reaction occurring within the last 10 years: No If all of the above answers are "NO", then may proceed with Cephalosporin use.   . Sulfa Antibiotics Rash    Patient Active Problem List   Diagnosis Date Noted  . Esophageal reflux 10/28/2015  . Obesity (BMI 30.0-34.9) 10/28/2015  . PTSD (post-traumatic stress disorder) 10/15/2014  . Hyperlipidemia 10/15/2014  . Family history of hypothyroidism 10/15/2014  . Bipolar disorder () 06/21/2013  . Personal history of alcoholism ([place]) 07/20/2012  . HTN (hypertension) 07/20/2012    Past Medical History:  Diagnosis Date  . Alcohol abuse   . Allergy   . Anxiety Dx 2006  . Bipolar 2 disorder [facility]) Dx 2008   [facility] 2011-2017  . Heart murmur    Dx at age of 71  . Hx of dysmenorrhea 07/20/2012   Seasonale prescribed for severe dysmenorrhea and heavy bleeding with clotting; on OCP, menses lasts 4 days with light bleeding.   Marland Kitchen Hyperlipidemia  Dx at age of 64  . Hypertension Dx 2009  . PTSD (post-traumatic stress disorder) Dx 2008  . Substance abuse ([place])    No alcohol since 2008    Past Surgical History:  Procedure Laterality Date  . APPENDECTOMY  2012  . ENDOMETRIAL ABLATION  02/2014   . TUBAL LIGATION  02/2014     Social History   Socioeconomic History  . Marital status: Divorced    Spouse name: Not on file  . Number of children: 1   . Years of education: grad schoo  . Highest education level: Not on file  Occupational History  . Not on file  Social Needs  . Financial resource  strain: Not on file  . Food insecurity:    Worry: Not on file    Inability: Not on file  . Transportation needs:    Medical: Not on file    Non-medical: Not on file  Tobacco Use  . Smoking status: Former Smoker    Last attempt to quit: 11/14/2001    Years since quitting: 17.1  . Smokeless tobacco: Never Used  Substance and Sexual Activity  . Alcohol use: No    Alcohol/week: 0.0 standard drinks    Comment: In Recovery  since 2006  . Drug use: No  . Sexual activity: Not Currently    Birth control/protection: Surgical  Lifestyle  . Physical activity:    Days per week: Not on file    Minutes per session: Not on file  . Stress: Not on file  Relationships  . Social connections:    Talks on phone: Not on file    Gets together: Not on file    Attends religious service: Not on file    Active member of club or organization: Not on file    Attends meetings of clubs or organizations: Not on file    Relationship status: Not on file  . Intimate partner violence:    Fear of current or ex partner: Not on file    Emotionally abused: Not on file    Physically abused: Not on file    Forced sexual activity: Not on file  Other Topics Concern  . Not on file  Social History Narrative   Social Hx:   Current living situation: lives in [place] with mother and daughter   Born and Raised in [place] by both parents. 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   Kids: 1 daughter  (15yo)   Legal issues: denies   recovery since 2006.   Alcoholism age [age]; binge drinking; benzo abuse stopped in 2008. Pt goes to AA 3x/week      Drugs: none; previous marijuna use about 5 times in her life time      Tobacco: quit  smoking in 2002; smoked x 25 years      Exercise: no formal exercise in 2018.      Family History  Problem Relation Age of Onset  . Hypertension Father   . Hyperlipidemia Father   . Cancer Father 63       CLL  . Anxiety disorder Father   . Depression Father   . Heart disease Father 65  CHF  . Hypertension Maternal Grandmother   . Hypothyroidism Maternal Grandmother        and great aunts   . Cancer Maternal Grandmother 74       on spinal cord, rare   . Hypertension Maternal Grandfather   . Bipolar disorder Paternal Grandmother   . Hypertension Paternal Grandmother   . Hyperlipidemia Paternal Grandmother   . Heart disease Paternal Grandmother   . Chronic fatigue Mother   . ADD / ADHD Daughter   . Anxiety disorder Daughter   . Depression Daughter      Review of Systems  Constitutional: Negative.  Negative for chills and fever.  HENT: Negative.  Negative for congestion, nosebleeds and sore throat.   Eyes: Negative.  Negative for blurred vision.  Respiratory: Negative.  Negative for cough and shortness of breath.   Cardiovascular: Negative.  Negative for chest pain and palpitations.  Gastrointestinal: Negative.  Negative for abdominal pain, diarrhea, nausea and vomiting.  Genitourinary: Negative.  Negative for dysuria.  Musculoskeletal: Negative.   Skin: Negative.  Negative for rash.  Neurological: Positive for dizziness. Negative for headaches.  Endo/Heme/Allergies: Negative.   All other systems reviewed and are negative.  Vitals:   01/10/19 1542  BP: 119/75  Pulse: 60  Temp: 97.8 F (36.6 C)  SpO2: 100%     Physical Exam Vitals signs reviewed.  HENT:     Head: Normocephalic and atraumatic.     Right Ear: Tympanic membrane, ear canal and external ear normal.     Left Ear: Tympanic membrane, ear canal and external ear normal.     Nose: Nose normal.     Mouth/Throat:     Mouth: Mucous membranes are moist.     Pharynx: Oropharynx is clear.  Eyes:      Extraocular Movements: Extraocular movements intact.     Conjunctiva/sclera: Conjunctivae normal.     Pupils: Pupils are equal, round, and reactive to light.     Funduscopic exam:    Right eye: No hemorrhage, exudate, AV nicking or papilledema. Red reflex present.        Left eye: No hemorrhage, exudate, AV nicking or papilledema. Red reflex present. Neck:     Musculoskeletal: Normal range of motion and neck supple.  Cardiovascular:     Rate and Rhythm: Normal rate and regular rhythm.     Pulses: Normal pulses.     Heart sounds: Murmur present. Systolic murmur present with a grade of 2/6.  Pulmonary:     Effort: Pulmonary effort is normal.     Breath sounds: Normal breath sounds.  Abdominal:     General: There is no distension.     Palpations: Abdomen is soft.     Tenderness: There is no abdominal tenderness.  Musculoskeletal: Normal range of motion.  Skin:    General: Skin is warm and dry.     Capillary Refill: Capillary refill takes less than 2 seconds.  Neurological:     General: No focal deficit present.     Mental Status: She is alert and oriented to person, place, and time.     Cranial Nerves: No cranial nerve deficit.     Sensory: No sensory deficit.     Motor: No weakness.     Coordination: Coordination normal.     Gait: Gait normal.  Psychiatric:        Mood and Affect: Mood normal.        Behavior: Behavior normal.      ASSESSMENT & PLAN:  Nancy Bradshaw was seen today for dizziness.  Diagnoses and all orders for this visit:  Dizziness -     CBC with Differential/Platelet -     Comprehensive metabolic panel -     TSH -     Ambulatory referral to Neurology    Patient Instructions       If you have lab work done today you will be contacted with your lab results within the next 2 weeks.  If you have not heard from [place] then please contact us. The fastest way to get your results is to register for My Chart.   IF you received an x-ray today, you will receive an  invoice from [facility]. Please contact [facility] at [phone] with questions or concerns regarding your invoice.   IF you received labwork today, you will receive an invoice from [facility] [place]. Please contact [facility] at [phone] with questions or concerns regarding your invoice.   Our billing staff will not be able to assist you with questions regarding bills from these companies.  You will be contacted with the lab results as soon as they are available. The fastest way to get your results is to activate your My Chart account. Instructions are located on the last page of this paperwork. If you have not heard from [place] regarding the results in 2 weeks, please contact this office.      Dizziness Dizziness is a common problem. It makes you feel unsteady or light-headed. You may feel like you are about to pass out (faint). Dizziness can lead to getting hurt if you stumble or fall. Dizziness can be caused by many things, including:  Medicines.  Not having enough water in your body (dehydration).  Illness. Follow these instructions at home: Eating and drinking   Drink enough fluid to keep your pee (urine) clear or pale yellow. This helps to keep you from getting dehydrated. Try to drink more clear fluids, such as water.  Do not drink alcohol.  Limit how much caffeine you drink or eat, if your doctor tells you to do that.  Limit how much salt (sodium) you drink or eat, if your doctor tells you to do that. Activity   Avoid making quick movements. ? When you stand up from sitting in a chair, steady yourself until you feel okay. ? In the morning, first sit up on the side of the bed. When you feel okay, stand slowly while you hold onto something. Do this until you know that your balance is fine.  If you need to stand in one place for a long time, move your legs often. Tighten and relax the muscles in your legs while you are standing.  Do not drive or use heavy  machinery if you feel dizzy.  Avoid bending down if you feel dizzy. Place items in your home so you can reach them easily without leaning over. Lifestyle  Do not use any products that contain nicotine or tobacco, such as cigarettes and e-cigarettes. If you need help quitting, ask your doctor.  Try to lower your stress level. You can do this by using methods such as yoga or meditation. Talk with your doctor if you need help. General instructions  Watch your dizziness for any changes.  Take over-the-counter and prescription medicines only as told by your doctor. Talk with your doctor if you think that you are dizzy because of a medicine that you are taking.  Tell a friend or a family member that you are feeling dizzy.  If he or she notices any changes in your behavior, have this person call your doctor.  Keep all follow-up visits as told by your doctor. This is important. Contact a doctor if:  Your dizziness does not go away.  Your dizziness or light-headedness gets worse.  You feel sick to your stomach (nauseous).  You have trouble hearing.  You have new symptoms.  You are unsteady on your feet.  You feel like the room is spinning. Get help right away if:  You throw up (vomit) or have watery poop (diarrhea), and you cannot eat or drink anything.  You have trouble: ? Talking. ? Walking. ? Swallowing. ? Using your arms, hands, or legs.  You feel generally weak.  You are not thinking clearly, or you have trouble forming sentences. A friend or family member may notice this.  You have: ? Chest pain. ? Pain in your belly (abdomen). ? Shortness of breath. ? Sweating.  Your vision changes.  You are bleeding.  You have a very bad headache.  You have neck pain or a stiff neck.  You have a fever. These symptoms may be an emergency. Do not wait to see if the symptoms will go away. Get medical help right away. Call your local emergency services (911 in the [place] Do not  drive yourself to the hospital. Summary  Dizziness makes you feel unsteady or light-headed. You may feel like you are about to pass out (faint).  Drink enough fluid to keep your pee (urine) clear or pale yellow. Do not drink alcohol.  Avoid making quick movements if you feel dizzy.  Watch your dizziness for any changes. This information is not intended to replace advice given to you by your health care provider. Make sure you discuss any questions you have with your health care provider. Document Released: 10/20/2011 Document Revised: 11/17/2016 Document Reviewed: 11/17/2016 Elsevier Interactive Patient Education  2019 Elsevier Inc.      Agustina Caroli, MD [facility]

## 2019-01-10 NOTE — Patient Instructions (Addendum)
If you have lab work done today you will be contacted with your lab results within the next 2 weeks.  If you have not heard from [place] then please contact us. The fastest way to get your results is to register for My Chart.   IF you received an x-ray today, you will receive an invoice from [facility]. Please contact [facility] at [phone] with questions or concerns regarding your invoice.   IF you received labwork today, you will receive an invoice from [place]. Please contact [facility] at [phone] with questions or concerns regarding your invoice.   Our billing staff will not be able to assist you with questions regarding bills from these companies.  You will be contacted with the lab results as soon as they are available. The fastest way to get your results is to activate your My Chart account. Instructions are located on the last page of this paperwork. If you have not heard from [place] regarding the results in 2 weeks, please contact this office.      Dizziness Dizziness is a common problem. It makes you feel unsteady or light-headed. You may feel like you are about to pass out (faint). Dizziness can lead to getting hurt if you stumble or fall. Dizziness can be caused by many things, including:  Medicines.  Not having enough water in your body (dehydration).  Illness. Follow these instructions at home: Eating and drinking   Drink enough fluid to keep your pee (urine) clear or pale yellow. This helps to keep you from getting dehydrated. Try to drink more clear fluids, such as water.  Do not drink alcohol.  Limit how much caffeine you drink or eat, if your doctor tells you to do that.  Limit how much salt (sodium) you drink or eat, if your doctor tells you to do that. Activity   Avoid making quick movements. ? When you stand up from sitting in a chair, steady yourself until you feel okay. ? In the morning, first sit up on the side of the bed. When  you feel okay, stand slowly while you hold onto something. Do this until you know that your balance is fine.  If you need to stand in one place for a long time, move your legs often. Tighten and relax the muscles in your legs while you are standing.  Do not drive or use heavy machinery if you feel dizzy.  Avoid bending down if you feel dizzy. Place items in your home so you can reach them easily without leaning over. Lifestyle  Do not use any products that contain nicotine or tobacco, such as cigarettes and e-cigarettes. If you need help quitting, ask your doctor.  Try to lower your stress level. You can do this by using methods such as yoga or meditation. Talk with your doctor if you need help. General instructions  Watch your dizziness for any changes.  Take over-the-counter and prescription medicines only as told by your doctor. Talk with your doctor if you think that you are dizzy because of a medicine that you are taking.  Tell a friend or a family member that you are feeling dizzy. If he or she notices any changes in your behavior, have this person call your doctor.  Keep all follow-up visits as told by your doctor. This is important. Contact a doctor if:  Your dizziness does not go away.  Your dizziness or light-headedness gets worse.  You feel sick to your stomach (nauseous).  You have trouble hearing.  You have new symptoms.  You are unsteady on your feet.  You feel like the room is spinning. Get help right away if:  You throw up (vomit) or have watery poop (diarrhea), and you cannot eat or drink anything.  You have trouble: ? Talking. ? Walking. ? Swallowing. ? Using your arms, hands, or legs.  You feel generally weak.  You are not thinking clearly, or you have trouble forming sentences. A friend or family member may notice this.  You have: ? Chest pain. ? Pain in your belly (abdomen). ? Shortness of breath. ? Sweating.  Your vision changes.  You  are bleeding.  You have a very bad headache.  You have neck pain or a stiff neck.  You have a fever. These symptoms may be an emergency. Do not wait to see if the symptoms will go away. Get medical help right away. Call your local emergency services (911 in the [place] Do not drive yourself to the hospital. Summary  Dizziness makes you feel unsteady or light-headed. You may feel like you are about to pass out (faint).  Drink enough fluid to keep your pee (urine) clear or pale yellow. Do not drink alcohol.  Avoid making quick movements if you feel dizzy.  Watch your dizziness for any changes. This information is not intended to replace advice given to you by your health care provider. Make sure you discuss any questions you have with your health care provider. Document Released: 10/20/2011 Document Revised: 11/17/2016 Document Reviewed: 11/17/2016 Elsevier Interactive Patient Education  2019 [employer].

## 2019-01-11 ENCOUNTER — Encounter: Payer: Self-pay | Admitting: Emergency Medicine

## 2019-01-11 LAB — COMPREHENSIVE METABOLIC PANEL
ALT: 11 IU/L (ref 0–32)
AST: 17 IU/L (ref 0–40)
Albumin/Globulin Ratio: 2.4 — ABNORMAL HIGH (ref 1.2–2.2)
Albumin: 4.8 g/dL (ref 3.8–4.8)
Alkaline Phosphatase: 66 IU/L (ref 39–117)
BUN/Creatinine Ratio: 12 (ref 9–23)
BUN: 14 mg/dL (ref 6–24)
Bilirubin Total: 0.2 mg/dL (ref 0.0–1.2)
CALCIUM: 9.7 mg/dL (ref 8.7–10.2)
CO2: 23 mmol/L (ref 20–29)
CREATININE: 1.17 mg/dL — AB (ref 0.57–1.00)
Chloride: 102 mmol/L (ref 96–106)
GFR calc Af Amer: 63 mL/min/{1.73_m2} (ref >59)
GFR calc non Af Amer: 54 mL/min/{1.73_m2} — ABNORMAL LOW (ref >59)
Globulin, Total: 2 g/dL (ref 1.5–4.5)
Glucose: 81 mg/dL (ref 65–99)
Potassium: 4.5 mmol/L (ref 3.5–5.2)
Sodium: 139 mmol/L (ref 134–144)
Total Protein: 6.8 g/dL (ref 6.0–8.5)

## 2019-01-11 LAB — CBC WITH DIFFERENTIAL/PLATELET
Basophils Absolute: 0.1 10*3/uL (ref 0.0–0.2)
Basos: 1 %
EOS (ABSOLUTE): 0.2 10*3/uL (ref 0.0–0.4)
Eos: 3 %
Hematocrit: 36.9 % (ref 34.0–46.6)
Hemoglobin: 13 g/dL (ref 11.1–15.9)
Immature Grans (Abs): 0 10*3/uL (ref 0.0–0.1)
Immature Granulocytes: 0 %
LYMPHS: 34 %
Lymphocytes Absolute: 2.3 10*3/uL (ref 0.7–3.1)
MCH: 30.9 pg (ref 26.6–33.0)
MCHC: 35.2 g/dL (ref 31.5–35.7)
MCV: 88 fL (ref 79–97)
MONOCYTES: 7 %
Monocytes Absolute: 0.5 10*3/uL (ref 0.1–0.9)
Neutrophils Absolute: 3.7 10*3/uL (ref 1.4–7.0)
Neutrophils: 55 %
Platelets: 227 10*3/uL (ref 150–450)
RBC: 4.21 x10E6/uL (ref 3.77–5.28)
RDW: 12.6 % (ref 11.7–15.4)
WBC: 6.7 10*3/uL (ref 3.4–10.8)

## 2019-01-11 LAB — TSH: TSH: 1.15 u[IU]/mL (ref 0.450–4.500)

## 2019-01-31 ENCOUNTER — Ambulatory Visit: Payer: 59 | Admitting: Family Medicine

## 2019-02-04 ENCOUNTER — Other Ambulatory Visit: Payer: Self-pay

## 2019-02-04 ENCOUNTER — Encounter: Payer: Self-pay | Admitting: Family Medicine

## 2019-02-04 ENCOUNTER — Ambulatory Visit (INDEPENDENT_AMBULATORY_CARE_PROVIDER_SITE_OTHER): Payer: 59 | Admitting: Family Medicine

## 2019-02-04 VITALS — BP 128/72 | HR 74 | Temp 97.9°F | Resp 18 | Ht 64.0 in | Wt 145.2 lb

## 2019-02-04 DIAGNOSIS — B070 Plantar wart: Secondary | ICD-10-CM | POA: Diagnosis not present

## 2019-02-04 DIAGNOSIS — Z23 Encounter for immunization: Secondary | ICD-10-CM

## 2019-02-04 NOTE — Patient Instructions (Addendum)
If you have lab work done today you will be contacted with your lab results within the next 2 weeks.  If you have not heard from [place] then please contact us. The fastest way to get your results is to register for My Chart.   IF you received an x-ray today, you will receive an invoice from [facility]. Please contact [facility] Radiology at [phone] with questions or concerns regarding your invoice.   IF you received labwork today, you will receive an invoice from [place]. Please contact [facility] at [phone] with questions or concerns regarding your invoice.   Our billing staff will not be able to assist you with questions regarding bills from these companies.  You will be contacted with the lab results as soon as they are available. The fastest way to get your results is to activate your My Chart account. Instructions are located on the last page of this paperwork. If you have not heard from [place] regarding the results in 2 weeks, please contact this office.     Cryosurgery for Skin Conditions, Care After These instructions give you information on caring for yourself after your procedure. Your doctor may also give you more specific instructions. Call your doctor if you have any problems or questions after your procedure. Follow these instructions at home: Caring for the treated area   Follow instructions from your doctor about how to take care of the treated area. Make sure you: ? Keep the area covered with a bandage (dressing) until it heals, or for as long as told by your doctor. ? Wash your hands with soap and water before you change your bandage. If you do not have soap and water, use hand sanitizer. ? Change your bandage as told by your doctor. ? Keep the bandage and the treated area clean and dry. If the bandage gets wet, change it right away. ? Clean the treated area with soap and water.  Check the treated area every day for signs of infection. Check for: ? More  redness, swelling, or pain. ? More fluid or blood. ? Warmth. ? Pus or a bad smell. General instructions  Do not pick at your blister. Do not try to break it open. This can cause infection and scarring.  Do not put any medicine, cream, or lotion on the treated area unless told by your doctor.  Take over-the-counter and prescription medicines only as told by your doctor.  Keep all follow-up visits as told by your doctor. This is important. Contact a doctor if:  You have more redness, swelling, or pain around the treated area.  You have more fluid or blood coming from the treated area.  The treated area feels warm to the touch.  You have pus or a bad smell coming from the treated area.  Your blister gets large and painful. Get help right away if:  You have a fever and have redness spreading from the treated area. Summary  You should keep the treated area and your bandage clean and dry.  Check the treated area every day for signs of infection. Signs include fluid, pus, warmth, or having more redness, swelling, or pain.  Do not pick at your blister. Do not try to break it open. This information is not intended to replace advice given to you by your health care provider. Make sure you discuss any questions you have with your health care provider. Document Released: 01/23/2012 Document Revised: 09/19/2016 Document Reviewed: 09/19/2016 Elsevier Interactive Patient Education  2019 [place].

## 2019-02-04 NOTE — Progress Notes (Signed)
Subjective:    Patient ID: Nancy Bradshaw, female    DOB: 10/13/1966, 53 y.o.   MRN: [id number]  HPI Nancy Bradshaw is a 53 y.o. female Presents today for: Chief Complaint  Patient presents with  . Mass    on right foot has bump on second to little toe more than a month ago    R foot bump: Lateral aspect of 4th toe. Noticed 1 month ago. Sore on first step in am, sore with hiking at times, but does not affect walking. No prior similar sx's.  - no new hiking boots/shoes/socks. Wears dansko at work. 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 - no recent change in mileage of hiking.  - stand up desk at work.    Patient Active Problem List   Diagnosis Date Noted  . Dizziness 01/10/2019  . Esophageal reflux 10/28/2015  . Obesity (BMI 30.0-34.9) 10/28/2015  . PTSD (post-traumatic stress disorder) 10/15/2014  . Hyperlipidemia 10/15/2014  . Family history of hypothyroidism 10/15/2014  . Bipolar disorder ([place]) 06/21/2013  . Personal history of alcoholism ([place]) 07/20/2012  . HTN (hypertension) 07/20/2012   Past Medical History:  Diagnosis Date  . Alcohol abuse   . Allergy   . Anxiety Dx 2006  . Bipolar 2 disorder [facility]) Dx 2008   [facility] 2011-2017  . Heart murmur    Dx at age of 52  . Hx of dysmenorrhea 07/20/2012   Seasonale prescribed for severe dysmenorrhea and heavy bleeding with clotting; on OCP, menses lasts 4 days with light bleeding.   Marland Kitchen Hyperlipidemia    Dx at age of 59  . Hypertension Dx 2009  . PTSD (post-traumatic stress disorder) Dx 2008  . Substance abuse ([place])    No alcohol since 2008   Past Surgical History:  Procedure Laterality Date  . APPENDECTOMY  2012  . ENDOMETRIAL ABLATION  02/2014   . TUBAL LIGATION  02/2014    Allergies  Allergen Reactions  . Azithromycin Nausea And Vomiting  . Codeine Nausea And Vomiting  . Other     Pt prefers not to have pain medication if not necessary- recovering alcoholic.   Ebbie Ridge [Pseudoephedrine Hcl]    "Creepy crawly skin"--if taken consistently  . Zoloft [Sertraline Hcl]     Due to bipolar disorder-makes her manic.   Marland Kitchen Penicillins Rash    Has patient had a PCN reaction causing immediate rash, facial/tongue/throat swelling, SOB or lightheadedness with hypotension: No Has patient had a PCN reaction causing severe rash involving mucus membranes or skin necrosis: No Has patient had a PCN reaction that required hospitalization: No Has patient had a PCN reaction occurring within the last 10 years: No If all of the above answers are "NO", then may proceed with Cephalosporin use.   . Sulfa Antibiotics Rash   Prior to Admission medications   Medication Sig Start Date End Date Taking? Authorizing Provider  lamoTRIgine (LAMICTAL) 200 MG tablet Take 1.5 tablets (300 mg total) by mouth daily. 12/27/18  Yes Charlcie Cradle, MD  lisinopril (PRINIVIL,ZESTRIL) 10 MG tablet Take 1 tablet (10 mg total) by mouth daily. 10/24/18  Yes Stallings, Zoe A, MD  LYSINE PO Take 1 tablet by mouth daily.   Yes [provider]  mirtazapine (REMERON) 45 MG tablet Take 1 tablet (45 mg total) by mouth at bedtime. 12/27/18  Yes Charlcie Cradle, MD  Multiple Vitamin (MULTIVITAMIN) tablet Take 1 tablet by mouth daily.   Yes [provider]  Red Yeast Rice Extract (RED  YEAST RICE PO) Take 1 tablet by mouth daily.   Yes [provider]  ziprasidone (GEODON) 80 MG capsule Take 1 capsule (80 mg total) by mouth daily. 12/27/18  Yes Charlcie Cradle, MD   Social History   Socioeconomic History  . Marital status: Divorced    Spouse name: Not on file  . Number of children: 1   . Years of education: grad schoo  . Highest education level: Not on file  Occupational History  . Not on file  Social Needs  . Financial resource strain: Not on file  . Food insecurity:    Worry: Not on file    Inability: Not on file  . Transportation needs:    Medical: Not on file    Non-medical: Not on file  Tobacco Use   . Smoking status: Former Smoker    Last attempt to quit: 11/14/2001    Years since quitting: 17.2  . Smokeless tobacco: Never Used  Substance and Sexual Activity  . Alcohol use: No    Alcohol/week: 0.0 standard drinks    Comment: In Recovery  since 2006  . Drug use: No  . Sexual activity: Not Currently    Birth control/protection: Surgical  Lifestyle  . Physical activity:    Days per week: Not on file    Minutes per session: Not on file  . Stress: Not on file  Relationships  . Social connections:    Talks on phone: Not on file    Gets together: Not on file    Attends religious service: Not on file    Active member of club or organization: Not on file    Attends meetings of clubs or organizations: Not on file    Relationship status: Not on file  . Intimate partner violence:    Fear of current or ex partner: Not on file    Emotionally abused: Not on file    Physically abused: Not on file    Forced sexual activity: Not on file  Other Topics Concern  . Not on file  Social History Narrative   Social Hx:   Current living situation: lives in [place] with mother and daughter   Born and Raised in [place] by both parents. 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   Kids: 1 daughter  (15yo)   Legal issues: denies   recovery since 2006.   Alcoholism age [age]; binge drinking; benzo abuse stopped in 2008. Pt goes to AA 3x/week      Drugs: none; previous marijuna use about 5 times in her life time      Tobacco: quit smoking in 2002; smoked x 25 years      Exercise: no formal exercise in 2018.      Review of Systems      Objective:   Physical Exam Constitutional:      General: She is not in  acute distress.    Appearance: She is well-developed.  HENT:     Head: Normocephalic and atraumatic.  Cardiovascular:     Rate and Rhythm: Normal rate.  Pulmonary:     Effort: Pulmonary effort is normal.  Musculoskeletal:       Feet:  Neurological:     Mental Status: She is alert and oriented to person, place, and time.    Vitals:   02/04/19 1456  BP: 128/72  Pulse: 74  Resp: 18  Temp: 97.9 F (36.6 C)  TempSrc: Oral  SpO2: 100%  Weight: 145 lb 3.2 oz (65.9 kg)  Height: 5\' 4"  (1.626 m)   Potential risks/benefits/alternatives for cryodestruction of lesion discussed with liquid nitrogen.  Questions answered and consent obtained.  3 freeze/thaw cycles each approximately 2 seconds to lesion of fourth toe.  Tolerated without difficulty.  Aftercare and expected changes discussed.      Assessment & Plan:   Nancy Bradshaw is a 53 y.o. female Need for Tdap vaccination - Plan: Tdap vaccine greater than or equal to 7yo IM  Plantar wart of right foot  Corn versus probable plantar wart based on appearance.  Different treatment options discussed including home treatment with salicylic acid, corn pad, topical treatment versus liquid nitrogen in office.  She chose to try liquid nitrogen cryodestruction, procedure as above.   - Potential risks of infection when blistering and scarring discussed, aftercare discussed.  -Padding around area with corn/callus pad may also be helpful.  RTC precautions  Tdap updated  No orders of the defined types were placed in this encounter.  Patient Instructions       If you have lab work done today you will be contacted with your lab results within the next 2 weeks.  If you have not heard from [place] then please contact us. The fastest way to get your results is to register for My Chart.   IF you received an x-ray today, you will receive an invoice from [facility]. Please contact [facility] at [phone] with questions or  concerns regarding your invoice.   IF you received labwork today, you will receive an invoice from [place]. Please contact [facility] at [phone] with questions or concerns regarding your invoice.   Our billing staff will not be able to assist you with questions regarding bills from these companies.  You will be contacted with the lab results as soon as they are available. The fastest way to get your results is to activate your My Chart account. Instructions are located on the last page of this paperwork. If you have not heard from [place] regarding the results in 2 weeks, please contact this office.     Cryosurgery for Skin Conditions, Care After These instructions give you information on caring for yourself after your procedure. Your doctor may also give you more specific instructions. Call your doctor if you have any problems or questions after your procedure. Follow these instructions at home: Caring for the treated area   Follow instructions from your doctor about how to take care of the treated area. Make sure you: ? Keep the area covered with a bandage (dressing) until it heals, or for as long as told by your doctor. ? Wash your hands with soap and water before you change your bandage. If you do not have soap and water, use hand sanitizer. ? Change your bandage as told by your doctor. ? Keep the bandage and the treated area clean and dry. If the bandage gets wet, change it right away. ? Clean the treated area with soap and water.  Check the treated area every day for signs of infection. Check for: ? More redness, swelling, or pain. ? More fluid or blood. ? Warmth. ? Pus or a bad smell. General instructions  Do  not pick at your blister. Do not try to break it open. This can cause infection and scarring.  Do not put any medicine, cream, or lotion on the treated area unless told by your doctor.  Take over-the-counter and prescription medicines only as told by your doctor.  Keep  all follow-up visits as told by your doctor. This is important. Contact a doctor if:  You have more redness, swelling, or pain around the treated area.  You have more fluid or blood coming from the treated area.  The treated area feels warm to the touch.  You have pus or a bad smell coming from the treated area.  Your blister gets large and painful. Get help right away if:  You have a fever and have redness spreading from the treated area. Summary  You should keep the treated area and your bandage clean and dry.  Check the treated area every day for signs of infection. Signs include fluid, pus, warmth, or having more redness, swelling, or pain.  Do not pick at your blister. Do not try to break it open. This information is not intended to replace advice given to you by your health care provider. Make sure you discuss any questions you have with your health care provider. Document Released: 01/23/2012 Document Revised: 09/19/2016 Document Reviewed: 09/19/2016 Elsevier Interactive Patient Education  2019 [place],   Merri Ray, MD [facility].  02/05/19 12:14 PM

## 2019-02-05 ENCOUNTER — Encounter: Payer: Self-pay | Admitting: Family Medicine

## 2019-02-28 ENCOUNTER — Other Ambulatory Visit (HOSPITAL_COMMUNITY): Payer: Self-pay | Admitting: Psychiatry

## 2019-02-28 DIAGNOSIS — F3181 Bipolar II disorder: Secondary | ICD-10-CM

## 2019-04-15 ENCOUNTER — Ambulatory Visit: Payer: 59 | Admitting: Family Medicine

## 2019-04-22 ENCOUNTER — Ambulatory Visit: Payer: 59 | Admitting: Family Medicine

## 2019-05-01 ENCOUNTER — Other Ambulatory Visit: Payer: Self-pay

## 2019-05-01 ENCOUNTER — Ambulatory Visit (INDEPENDENT_AMBULATORY_CARE_PROVIDER_SITE_OTHER): Payer: 59 | Admitting: Family Medicine

## 2019-05-01 ENCOUNTER — Encounter: Payer: Self-pay | Admitting: Family Medicine

## 2019-05-01 VITALS — BP 110/70 | HR 61 | Temp 98.4°F | Resp 17 | Ht 64.0 in | Wt 138.2 lb

## 2019-05-01 DIAGNOSIS — R7989 Other specified abnormal findings of blood chemistry: Secondary | ICD-10-CM

## 2019-05-01 DIAGNOSIS — Z7689 Persons encountering health services in other specified circumstances: Secondary | ICD-10-CM | POA: Diagnosis not present

## 2019-05-01 DIAGNOSIS — I10 Essential (primary) hypertension: Secondary | ICD-10-CM | POA: Diagnosis not present

## 2019-05-01 NOTE — Progress Notes (Signed)
Established Patient Office Visit  Subjective:  Patient ID: Nancy Bradshaw, female    DOB: 05-22-1966  Age: 53 y.o. MRN: [id number]  CC:  Chief Complaint  Patient presents with  . follow up blood pressure    needs refill on lisinopril    HPI [name] presents for   Patient reports that she was told that she would like to discuss her kidney function and was told that her kidneys were sluggish She states that she is wondering why this is the case. She states that she drinks plenty of water. She states that she had some dizziness and was wondering if the bp had something to do with it. She is walking 10,000 steps a day and does yoga. She has very carb conscious low sodium diet.  BP Readings from Last 3 Encounters:  05/01/19 110/70  02/04/19 128/72  01/10/19 119/75    Component     Latest Ref Rng & Units 04/05/2017 10/18/2017 04/23/2018 10/24/2018  Creatinine     0.57 - 1.00 mg/dL 0.92 1.04 (H) 1.02 (H) 0.90  GFR, Est Non African American     >59 mL/min/1.73 74 63 65 75   Component     Latest Ref Rng & Units 01/10/2019  Creatinine     0.57 - 1.00 mg/dL 1.17 (H)  GFR, Est Non African American     >59 mL/min/1.73 54 (L)   Weight management She reports that she has been losing weight and wants to slow her weight loss She is exerciising a lot and eats a very healthy diet with low carbohydrates She has low appetite She denies any nausea  Wt Readings from Last 3 Encounters:  05/01/19 138 lb 3.2 oz (62.7 kg)  02/04/19 145 lb 3.2 oz (65.9 kg)  01/10/19 143 lb 9.6 oz (65.1 kg)     Past Medical History:  Diagnosis Date  . Alcohol abuse   . Allergy   . Anxiety Dx 2006  . Bipolar 2 disorder [facility]) Dx 2008   [facility] 2011-2017  . Heart murmur    Dx at age of 89  . Hx of dysmenorrhea 07/20/2012   Seasonale prescribed for severe dysmenorrhea and heavy bleeding with clotting; on OCP, menses lasts 4 days with light bleeding.   Marland Kitchen Hyperlipidemia    Dx at age of  16  . Hypertension Dx 2009  . PTSD (post-traumatic stress disorder) Dx 2008  . Substance abuse ([place])    No alcohol since 2008    Past Surgical History:  Procedure Laterality Date  . APPENDECTOMY  2012  . ENDOMETRIAL ABLATION  02/2014   . TUBAL LIGATION  02/2014     Family History  Problem Relation Age of Onset  . Hypertension Father   . Hyperlipidemia Father   . Cancer Father 56       CLL  . Anxiety disorder Father   . Depression Father   . Heart disease Father 21       CHF  . Hypertension Maternal Grandmother   . Hypothyroidism Maternal Grandmother        and great aunts   . Cancer Maternal Grandmother 74       on spinal cord, rare   . Hypertension Maternal Grandfather   . Bipolar disorder Paternal Grandmother   . Hypertension Paternal Grandmother   . Hyperlipidemia Paternal Grandmother   . Heart disease Paternal Grandmother   . Chronic fatigue Mother   . ADD / ADHD Daughter   . Anxiety  disorder Daughter   . Depression Daughter     Social History   Socioeconomic History  . Marital status: Divorced    Spouse name: Not on file  . Number of children: 1   . Years of education: grad schoo  . Highest education level: Not on file  Occupational History  . Not on file  Social Needs  . Financial resource strain: Not on file  . Food insecurity    Worry: Not on file    Inability: Not on file  . Transportation needs    Medical: Not on file    Non-medical: Not on file  Tobacco Use  . Smoking status: Former Smoker    Quit date: 11/14/2001    Years since quitting: 17.4  . Smokeless tobacco: Never Used  Substance and Sexual Activity  . Alcohol use: No    Alcohol/week: 0.0 standard drinks    Comment: In Recovery  since 2006  . Drug use: No  . Sexual activity: Not Currently    Birth control/protection: Surgical  Lifestyle  . Physical activity    Days per week: Not on file    Minutes per session: Not on file  . Stress: Not on file  Relationships  . Social  Herbalist on phone: Not on file    Gets together: Not on file    Attends religious service: Not on file    Active member of club or organization: Not on file    Attends meetings of clubs or organizations: Not on file    Relationship status: Not on file  . Intimate partner violence    Fear of current or ex partner: Not on file    Emotionally abused: Not on file    Physically abused: Not on file    Forced sexual activity: Not on file  Other Topics Concern  . Not on file  Social History Narrative   Social Hx:   Current living situation: lives in [place] with mother and daughter   Born and Raised in [place] by both parents. 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   Kids: 1 daughter  (15yo)   Legal issues: denies   recovery since 2006.   Alcoholism age [age]; binge drinking; benzo abuse stopped in 2008. Pt goes to AA 3x/week      Drugs: none; previous marijuna use about 5 times in her life time      Tobacco: quit smoking in 2002; smoked x 25 years      Exercise: no formal exercise in 2018.      Outpatient Medications Prior to Visit  Medication Sig Dispense Refill  . lamoTRIgine (LAMICTAL) 200 MG tablet Take 1.5 tablets (300 mg total) by mouth daily. 135 tablet 1  . lisinopril (PRINIVIL,ZESTRIL) 10 MG tablet Take 1 tablet (10 mg total) by mouth daily. 90 tablet 1  . LYSINE PO Take 1 tablet by mouth daily.    . mirtazapine (REMERON) 45 MG tablet Take 1 tablet (45 mg total) by mouth at bedtime. 90 tablet 1  . Multiple Vitamin (MULTIVITAMIN) tablet Take 1 tablet by mouth daily.    . Red Yeast Rice Extract (RED YEAST RICE PO)  Take 1 tablet by mouth daily.    . ziprasidone (GEODON) 80 MG  capsule Take 1 capsule (80 mg total) by mouth daily. 90 capsule 1   No facility-administered medications prior to visit.     Allergies  Allergen Reactions  . Azithromycin Nausea And Vomiting  . Codeine Nausea And Vomiting  . Other     Pt prefers not to have pain medication if not necessary- recovering alcoholic.   Ebbie Ridge [Pseudoephedrine Hcl]     "Creepy crawly skin"--if taken consistently  . Zoloft [Sertraline Hcl]     Due to bipolar disorder-makes her manic.   Marland Kitchen Penicillins Rash    Has patient had a PCN reaction causing immediate rash, facial/tongue/throat swelling, SOB or lightheadedness with hypotension: No Has patient had a PCN reaction causing severe rash involving mucus membranes or skin necrosis: No Has patient had a PCN reaction that required hospitalization: No Has patient had a PCN reaction occurring within the last 10 years: No If all of the above answers are "NO", then may proceed with Cephalosporin use.   . Sulfa Antibiotics Rash    ROS Review of Systems Review of Systems  Constitutional: Negative for activity change, appetite change, chills and fever.  HENT: Negative for congestion, nosebleeds, trouble swallowing and voice change.   Respiratory: Negative for cough, shortness of breath and wheezing.   Gastrointestinal: Negative for diarrhea, nausea and vomiting.  Genitourinary: Negative for difficulty urinating, dysuria, flank pain and hematuria.  Musculoskeletal: Negative for back pain, joint swelling and neck pain.  Neurological: Negative for dizziness, speech difficulty, light-headedness and numbness.  See HPI. All other review of systems negative.     Objective:    Physical Exam  BP 110/70 (BP Location: Right Arm, Patient Position: Sitting, Cuff Size: Normal)   Pulse 61   Temp 98.4 F (36.9 C) (Oral)   Resp 17   Ht '5\' 4"'  (1.626 m)   Wt 138 lb 3.2 oz (62.7 kg)   SpO2 98%   BMI 23.72 kg/m  Wt Readings from Last 3 Encounters:  05/01/19 138 lb 3.2  oz (62.7 kg)  02/04/19 145 lb 3.2 oz (65.9 kg)  01/10/19 143 lb 9.6 oz (65.1 kg)   Physical Exam  Constitutional: Oriented to person, place, and time. Appears well-developed and well-nourished.  HENT:  Head: Normocephalic and atraumatic.  Eyes: Conjunctivae and EOM are normal.  Cardiovascular: Normal rate, regular rhythm, normal heart sounds and intact distal pulses.  No murmur heard. Pulmonary/Chest: Effort normal and breath sounds normal. No stridor. No respiratory distress. Has no wheezes.  Neurological: Is alert and oriented to person, place, and time.  Skin: Skin is warm. Capillary refill takes less than 2 seconds.  Psychiatric: Has a normal mood and affect. Behavior is normal. Judgment and thought content normal.    Health Maintenance Due  Topic Date Due  . MAMMOGRAM  10/12/2018  . COLONOSCOPY  10/12/2018    There are no preventive care reminders to display for this patient.  Lab Results  Component Value Date   TSH 1.150 01/10/2019   Lab Results  Component Value Date   WBC 6.7 01/10/2019   HGB 13.0 01/10/2019   HCT 36.9 01/10/2019   MCV 88 01/10/2019   PLT 227 01/10/2019   Lab Results  Component Value Date   NA 139 01/10/2019   K 4.5 01/10/2019   CO2 23 01/10/2019   GLUCOSE 81 01/10/2019   BUN 14 01/10/2019   CREATININE 1.17 (H) 01/10/2019   BILITOT  0.2 01/10/2019   ALKPHOS 66 01/10/2019   AST 17 01/10/2019   ALT 11 01/10/2019   PROT 6.8 01/10/2019   ALBUMIN 4.8 01/10/2019   CALCIUM 9.7 01/10/2019   Lab Results  Component Value Date   CHOL 332 (H) 10/24/2018   Lab Results  Component Value Date   HDL 82 10/24/2018   Lab Results  Component Value Date   LDLCALC 232 (H) 10/24/2018   Lab Results  Component Value Date   TRIG 91 10/24/2018   Lab Results  Component Value Date   CHOLHDL 4.0 10/24/2018   Lab Results  Component Value Date   HGBA1C 5.5 10/24/2018      Assessment & Plan:   Problem List Items Addressed This Visit       Cardiovascular and Mediastinum   HTN (hypertension) (Chronic)   Relevant Orders   CMP14+EGFR    Other Visit Diagnoses    Elevated serum creatinine    -  Primary   Relevant Orders   CMP14+EGFR   Encounter for weight management          Hypertension and Elevated serum creatinine will check filtration and creatinine today Will do a drug holiday for 3 months  If the creatinine and GFR are stable and blood is still at goal then patient will be considered a diet controlled no medications necessary to control the blood pressure  Weight management  Pt advised to increase calories to prevent weight loss such as adding peanut butter to apply for afternoon snack.   No orders of the defined types were placed in this encounter.   Follow-up: Return in about 3 months (around 08/01/2019) for kidney function recheck .   A total of 30 minutes were spent face-to-face with the patient during this encounter and over half of that time was spent on counseling and coordination of care.  Forrest Moron, MD

## 2019-05-01 NOTE — Patient Instructions (Addendum)
Stop the lisinopril for the next 3 months Return for repeat lab in 3 months then see me a few days after to discuss    If you have lab work done today you will be contacted with your lab results within the next 2 weeks.  If you have not heard from [place] then please contact us. The fastest way to get your results is to register for My Chart.   IF you received an x-ray today, you will receive an invoice from [facility]. Please contact [facility] at [phone] with questions or concerns regarding your invoice.   IF you received labwork today, you will receive an invoice from [facility]. Please contact [facility] at [phone] with questions or concerns regarding your invoice.   Our billing staff will not be able to assist you with questions regarding bills from these companies.  You will be contacted with the lab results as soon as they are available. The fastest way to get your results is to activate your My Chart account. Instructions are located on the last page of this paperwork. If you have not heard from [place] regarding the results in 2 weeks, please contact this office.

## 2019-05-02 LAB — CMP14+EGFR
ALT: 26 IU/L (ref 0–32)
AST: 23 IU/L (ref 0–40)
Albumin/Globulin Ratio: 2.4 — ABNORMAL HIGH (ref 1.2–2.2)
Albumin: 4.8 g/dL (ref 3.8–4.8)
Alkaline Phosphatase: 74 IU/L (ref 39–117)
BUN/Creatinine Ratio: 15 (ref 9–23)
BUN: 18 mg/dL (ref 6–24)
Bilirubin Total: 0.3 mg/dL (ref 0.0–1.2)
CO2: 22 mmol/L (ref 20–29)
Calcium: 10 mg/dL (ref 8.7–10.2)
Chloride: 102 mmol/L (ref 96–106)
Creatinine, Ser: 1.22 mg/dL — ABNORMAL HIGH (ref 0.57–1.00)
GFR calc Af Amer: 60 mL/min/{1.73_m2} (ref >59)
GFR calc non Af Amer: 52 mL/min/{1.73_m2} — ABNORMAL LOW (ref >59)
Globulin, Total: 2 g/dL (ref 1.5–4.5)
Glucose: 76 mg/dL (ref 65–99)
Potassium: 4.2 mmol/L (ref 3.5–5.2)
Sodium: 140 mmol/L (ref 134–144)
Total Protein: 6.8 g/dL (ref 6.0–8.5)

## 2019-05-13 ENCOUNTER — Telehealth: Payer: Self-pay | Admitting: Family Medicine

## 2019-05-13 ENCOUNTER — Other Ambulatory Visit: Payer: Self-pay

## 2019-05-13 DIAGNOSIS — Z1211 Encounter for screening for malignant neoplasm of colon: Secondary | ICD-10-CM

## 2019-05-13 DIAGNOSIS — Z1239 Encounter for other screening for malignant neoplasm of breast: Secondary | ICD-10-CM

## 2019-05-13 NOTE — Telephone Encounter (Signed)
Copied from [place] [phone]. Topic: Referral - Request for Referral >> May 13, 2019  3:14 PM Virl Axe D wrote: Has patient seen PCP for this complaint? Yes *If NO, is insurance requiring patient see PCP for this issue before PCP can refer them? Referral for which specialty: Colonoscopy/Mammogram Preferred provider/office: In network Reason for referral: Pt due for colonoscopy/mammogram

## 2019-05-13 NOTE — Telephone Encounter (Signed)
Referrals was sent

## 2019-05-20 ENCOUNTER — Encounter: Payer: Self-pay | Admitting: Gastroenterology

## 2019-06-08 ENCOUNTER — Other Ambulatory Visit (HOSPITAL_COMMUNITY): Payer: Self-pay | Admitting: Psychiatry

## 2019-06-08 DIAGNOSIS — F4310 Post-traumatic stress disorder, unspecified: Secondary | ICD-10-CM

## 2019-06-08 DIAGNOSIS — F411 Generalized anxiety disorder: Secondary | ICD-10-CM

## 2019-06-08 DIAGNOSIS — F3181 Bipolar II disorder: Secondary | ICD-10-CM

## 2019-06-20 ENCOUNTER — Encounter (HOSPITAL_COMMUNITY): Payer: Self-pay | Admitting: Psychiatry

## 2019-06-20 ENCOUNTER — Other Ambulatory Visit: Payer: Self-pay

## 2019-06-20 ENCOUNTER — Ambulatory Visit (INDEPENDENT_AMBULATORY_CARE_PROVIDER_SITE_OTHER): Payer: 59 | Admitting: Psychiatry

## 2019-06-20 ENCOUNTER — Ambulatory Visit (HOSPITAL_COMMUNITY): Payer: 59 | Admitting: Psychiatry

## 2019-06-20 DIAGNOSIS — F3181 Bipolar II disorder: Secondary | ICD-10-CM | POA: Diagnosis not present

## 2019-06-20 DIAGNOSIS — F411 Generalized anxiety disorder: Secondary | ICD-10-CM

## 2019-06-20 DIAGNOSIS — F4310 Post-traumatic stress disorder, unspecified: Secondary | ICD-10-CM | POA: Diagnosis not present

## 2019-06-20 MED ORDER — ZIPRASIDONE HCL 80 MG PO CAPS: 80.0000 mg | ORAL_CAPSULE | Freq: Every day | ORAL | 5 refills | Status: DC

## 2019-06-20 MED ORDER — LAMOTRIGINE 200 MG PO TABS: 300.0000 mg | ORAL_TABLET | Freq: Every day | ORAL | 1 refills | Status: DC

## 2019-06-20 MED ORDER — MIRTAZAPINE 45 MG PO TABS: 45.0000 mg | ORAL_TABLET | Freq: Every day | ORAL | 1 refills | Status: DC

## 2019-06-20 NOTE — Progress Notes (Signed)
Virtual Visit via Telephone Note  I connected with Nancy Bradshaw on 06/20/19 at  4:30 PM EDT by telephone and verified that I am speaking with the correct person using two identifiers.  Location: Patient: home Provider: office   I discussed the limitations, risks, security and privacy concerns of performing an evaluation and management service by telephone and the availability of in person appointments. I also discussed with the patient that there may be a patient responsible charge related to this service. The patient expressed understanding and agreed to proceed.   History of Present Illness: "COVID has changed everything for me". She is till working and her "bottem line" has not been affected. Her biggest struggle is the social distancing and isolation. Her mood is "fine". She has been managing her depression and anxiety. She is using her coping skills but has to work harder at it. Pt denies manic and hypomanic like symptoms recently. She had one day where she woke up at 3am and was very productive. She was tired by 9pm and was able to sleep. Sleep is good most nights. Pt denies SI/HI. Pt denies any active PTSD symptoms. She has a few symptoms of HV or flashbacks a few times a year.    Observations/Objective: I spoke with Nancy Bradshaw on the phone.  Pt was calm, pleasant and cooperative.  Pt was engaged in the conversation and answered questions appropriately.  Speech was clear and coherent with normal rate, tone and volume.  Mood is depressed and anxious, affect is full. Thought processes are coherent, goal oriented and intact.  Thought content is logical.  Pt denies SI/HI. Pt denies auditory and visual hallucinations and did not appear to be responding to internal stimuli.  Memory and concentration are good.  Fund of knowledge and use of language are average.  Insight and judgment are fair.  I am unable to comment on psychomotor activity, general appearance, hygiene, or eye contact as I  was unable to physically see the patient on the phone.  Vital signs not available since interview conducted virtually.    Assessment and Plan: Bipolar II d/o; PTSD; GAD; alcohol use d/o in remission  Geodon 80mg  po qD Remeron 45mg  po qD Lamictal 300mg  po qD Pt does not want meds changed at this time.  Labs done 05/01/2019 show elevated creatinine 1.22; 01/10/2019 CBC wnl, creatinine 1.17, TSH wnl, HbA1c wnl   Follow Up Instructions: In 3 months or sooner if needed   I discussed the assessment and treatment plan with the patient. The patient was provided an opportunity to ask questions and all were answered. The patient agreed with the plan and demonstrated an understanding of the instructions.   The patient was advised to call back or seek an in-person evaluation if the symptoms worsen or if the condition fails to improve as anticipated.  I provided 20 minutes of non-face-to-face time during this encounter.   Charlcie Cradle, MD

## 2019-06-25 ENCOUNTER — Encounter: Payer: Self-pay | Admitting: Gastroenterology

## 2019-07-23 ENCOUNTER — Other Ambulatory Visit: Payer: Self-pay

## 2019-07-23 ENCOUNTER — Ambulatory Visit (INDEPENDENT_AMBULATORY_CARE_PROVIDER_SITE_OTHER): Payer: 59 | Admitting: Family Medicine

## 2019-07-23 VITALS — BP 128/78 | Wt 140.6 lb

## 2019-07-23 DIAGNOSIS — R7989 Other specified abnormal findings of blood chemistry: Secondary | ICD-10-CM

## 2019-07-23 DIAGNOSIS — Z23 Encounter for immunization: Secondary | ICD-10-CM

## 2019-07-24 LAB — CMP14+EGFR
ALT: 16 IU/L (ref 0–32)
AST: 19 IU/L (ref 0–40)
Albumin/Globulin Ratio: 2.4 — ABNORMAL HIGH (ref 1.2–2.2)
Albumin: 4.4 g/dL (ref 3.8–4.8)
Alkaline Phosphatase: 68 IU/L (ref 39–117)
BUN/Creatinine Ratio: 14 (ref 9–23)
BUN: 15 mg/dL (ref 6–24)
Bilirubin Total: 0.3 mg/dL (ref 0.0–1.2)
CO2: 25 mmol/L (ref 20–29)
Calcium: 9.3 mg/dL (ref 8.7–10.2)
Chloride: 102 mmol/L (ref 96–106)
Creatinine, Ser: 1.06 mg/dL — ABNORMAL HIGH (ref 0.57–1.00)
GFR calc Af Amer: 71 mL/min/{1.73_m2} (ref >59)
GFR calc non Af Amer: 61 mL/min/{1.73_m2} (ref >59)
Globulin, Total: 1.8 g/dL (ref 1.5–4.5)
Glucose: 98 mg/dL (ref 65–99)
Potassium: 4.2 mmol/L (ref 3.5–5.2)
Sodium: 142 mmol/L (ref 134–144)
Total Protein: 6.2 g/dL (ref 6.0–8.5)

## 2019-07-26 ENCOUNTER — Other Ambulatory Visit: Payer: Self-pay

## 2019-07-26 ENCOUNTER — Ambulatory Visit (AMBULATORY_SURGERY_CENTER): Payer: Self-pay | Admitting: *Deleted

## 2019-07-26 VITALS — Temp 97.1°F | Ht 64.75 in | Wt 139.0 lb

## 2019-07-26 DIAGNOSIS — Z1211 Encounter for screening for malignant neoplasm of colon: Secondary | ICD-10-CM

## 2019-07-26 MED ORDER — NA SULFATE-K SULFATE-MG SULF 17.5-3.13-1.6 GM/177ML PO SOLN: ORAL | 0 refills | Status: DC

## 2019-07-26 NOTE — Progress Notes (Signed)
Patient is here in-person for PV. Patient denies any allergies to eggs or soy. Patient denies any problems with anesthesia/sedation. Patient denies any oxygen use at home. Patient denies taking any diet/weight loss medications or blood thinners. EMMI education assisgned to patient on colonoscopy, this was explained and instructions given to patient.  Patient request a low volume prep, she feels like she will not be able to drink the [place], Suprep instructions given to pt and coupon.    Pt is aware that care partner will wait in the car during procedure; if they feel like they will be too hot to wait in the car; they may wait in the lobby.  We want them to wear a mask (we do not have any that we can provide them), practice social distancing, and we will check their temperatures when they get here.  I did remind patient that their care partner needs to stay in the parking lot the entire time. Pt will wear mask into building.

## 2019-07-30 ENCOUNTER — Encounter: Payer: Self-pay | Admitting: Gastroenterology

## 2019-07-31 ENCOUNTER — Other Ambulatory Visit: Payer: Self-pay

## 2019-07-31 ENCOUNTER — Encounter: Payer: Self-pay | Admitting: Family Medicine

## 2019-07-31 ENCOUNTER — Ambulatory Visit (INDEPENDENT_AMBULATORY_CARE_PROVIDER_SITE_OTHER): Payer: 59 | Admitting: Family Medicine

## 2019-07-31 VITALS — BP 128/84 | HR 69 | Temp 98.7°F | Resp 17 | Ht 64.75 in | Wt 140.4 lb

## 2019-07-31 DIAGNOSIS — I10 Essential (primary) hypertension: Secondary | ICD-10-CM

## 2019-07-31 DIAGNOSIS — R42 Dizziness and giddiness: Secondary | ICD-10-CM | POA: Diagnosis not present

## 2019-07-31 DIAGNOSIS — R7989 Other specified abnormal findings of blood chemistry: Secondary | ICD-10-CM | POA: Diagnosis not present

## 2019-07-31 MED ORDER — SHINGRIX 50 MCG/0.5ML IM SUSR: 0.5000 mL | Freq: Once | INTRAMUSCULAR | 1 refills | Status: AC

## 2019-07-31 NOTE — Patient Instructions (Addendum)
We recommend that you schedule a mammogram for breast cancer screening. Typically, you do not need a referral to do this. Please contact a local imaging center to schedule your mammogram.   [facility] ([place]) - [phone] or [phone]    Remain off lisinopril Continue to monitor your blood pressure Call the clinic if it remains above 140/90    If you have lab work done today you will be contacted with your lab results within the next 2 weeks.  If you have not heard from [place] then please contact us. The fastest way to get your results is to register for My Chart.   IF you received an x-ray today, you will receive an invoice from [facility]. Please contact [facility] at [phone] with questions or concerns regarding your invoice.   IF you received labwork today, you will receive an invoice from [facility]. Please contact [facility] at [phone] with questions or concerns regarding your invoice.   Our billing staff will not be able to assist you with questions regarding bills from these companies.  You will be contacted with the lab results as soon as they are available. The fastest way to get your results is to activate your My Chart account. Instructions are located on the last page of this paperwork. If you have not heard from [place] regarding the results in 2 weeks, please contact this office.     How to Take Your Blood Pressure Blood pressure is a measurement of how strongly your blood is pressing against the walls of your arteries. Arteries are blood vessels that carry blood from your heart throughout your body. Your health care provider takes your blood pressure at each office visit. You can also take your own blood pressure at home with a blood pressure machine. You may need to take your own blood pressure:  To confirm a diagnosis of high blood pressure (hypertension).  To monitor your blood pressure over time.  To make sure your  blood pressure medicine is working. Supplies needed: To take your blood pressure, you will need a blood pressure machine. You can buy a blood pressure machine, or blood pressure monitor, at most drugstores or online. There are several types of home blood pressure monitors. When choosing one, consider the following:  Choose a monitor that has an arm cuff.  Choose a cuff that wraps snugly around your upper arm. You should be able to fit only one finger between your arm and the cuff.  Do not choose a monitor that measures your blood pressure from your wrist or finger. Your health care provider can suggest a reliable monitor that will meet your needs. How to prepare To get the most accurate reading, avoid the following for 30 minutes before you check your blood pressure:  Drinking caffeine.  Drinking alcohol.  Eating.  Smoking.  Exercising. Five minutes before you check your blood pressure:  Empty your bladder.  Sit quietly without talking in a dining chair, rather than in a soft couch or armchair. How to take your blood pressure To check your blood pressure, follow the instructions in the manual that came with your blood pressure monitor. If you have a digital blood pressure monitor, the instructions may be as follows: 1. Sit up straight. 2. Place your feet on the floor. Do not cross your ankles or legs. 3. Rest your left arm at the level of your heart on a table or desk or on the arm of a chair. 4.  Pull up your shirt sleeve. 5. Wrap the blood pressure cuff around the upper part of your left arm, 1 inch (2.5 cm) above your elbow. It is best to wrap the cuff around bare skin. 6. Fit the cuff snugly around your arm. You should be able to place only one finger between the cuff and your arm. 7. Position the cord inside the groove of your elbow. 8. Press the power button. 9. Sit quietly while the cuff inflates and deflates. 10. Read the digital reading on the monitor screen and write it  down (record it). 11. Wait 2-3 minutes, then repeat the steps, starting at step 1. What does my blood pressure reading mean? A blood pressure reading consists of a higher number over a lower number. Ideally, your blood pressure should be below 120/80. The first ("top") number is called the systolic pressure. It is a measure of the pressure in your arteries as your heart beats. The second ("bottom") number is called the diastolic pressure. It is a measure of the pressure in your arteries as the heart relaxes. Blood pressure is classified into four stages. The following are the stages for adults who do not have a short-term serious illness or a chronic condition. Systolic pressure and diastolic pressure are measured in a unit called mm Hg. Normal  Systolic pressure: below 123456.  Diastolic pressure: below 80. Elevated  Systolic pressure: [id number].  Diastolic pressure: below 80. Hypertension stage 1  Systolic pressure: 0000000.  Diastolic pressure: [id number]. Hypertension stage 2  Systolic pressure: XX123456 or above.  Diastolic pressure: 90 or above. You can have prehypertension or hypertension even if only the systolic or only the diastolic number in your reading is higher than normal. Follow these instructions at home:  Check your blood pressure as often as recommended by your health care provider.  Take your monitor to the next appointment with your health care provider to make sure: ? That you are using it correctly. ? That it provides accurate readings.  Be sure you understand what your goal blood pressure numbers are.  Tell your health care provider if you are having any side effects from blood pressure medicine. Contact a health care provider if:  Your blood pressure is consistently high. Get help right away if:  Your systolic blood pressure is higher than 180.  Your diastolic blood pressure is higher than 110. This information is not intended to replace advice given to you by  your health care provider. Make sure you discuss any questions you have with your health care provider. Document Released: 04/08/2016 Document Revised: 10/13/2017 Document Reviewed: 04/08/2016 Elsevier Patient Education  2020 [employer].

## 2019-07-31 NOTE — Progress Notes (Signed)
Established Patient Office Visit  Subjective:  Patient ID: Nancy Bradshaw, female    DOB: 05/08/1966  Age: 53 y.o. MRN: [id number]  CC:  Chief Complaint  Patient presents with  . kidney function 3 month recheck    HPI ROTONDA STANKOVIC presents for   She is here to discuss her creatinine which normalized.  She occasional takes alleve.  Patient report that she is a recovering alcoholic and her liver might have been messed up so she does not want to take tylenol.  She reports that she has a headache once every 90 days and she thinks that she only needs her alleve when she goes hiking She denies any current use of any meds that were not prescribed.   She reports that she and her Psychiatrist are going to review her meds and she would like to stay on the [place].  Hypertension and Dizziness She stopped her lisinopril and her bp has been doing much better She states that the dizziness resolved She wants to know if she should remain off lisinopril She is monitoring her bp and exercising as well BP Readings from Last 3 Encounters:  07/31/19 128/84  07/23/19 128/78  05/01/19 110/70     Past Medical History:  Diagnosis Date  . Alcohol abuse   . Allergy   . Anxiety Dx 2006  . Bipolar 2 disorder [facility]) Dx 2008   [facility] 2011-2017  . Heart murmur    Dx at age of 15  . Hx of dysmenorrhea 07/20/2012   Seasonale prescribed for severe dysmenorrhea and heavy bleeding with clotting; on OCP, menses lasts 4 days with light bleeding.   Marland Kitchen Hyperlipidemia    Dx at age of 66  . Hypertension Dx 2009  . Post-operative nausea and vomiting   . PTSD (post-traumatic stress disorder) Dx 2008  . Sleep apnea    mouth piece  . Substance abuse ([place])    No alcohol since 2008    Past Surgical History:  Procedure Laterality Date  . APPENDECTOMY  2012  . ENDOMETRIAL ABLATION  02/2014   . TUBAL LIGATION  02/2014     Family History  Problem Relation Age of Onset  . Hypertension Father    . Hyperlipidemia Father   . Cancer Father 68       CLL  . Anxiety disorder Father   . Depression Father   . Heart disease Father 76       CHF  . Colon polyps Father   . Hypertension Maternal Grandmother   . Hypothyroidism Maternal Grandmother        and great aunts   . Cancer Maternal Grandmother 74       on spinal cord, rare   . Hypertension Maternal Grandfather   . Bipolar disorder Paternal Grandmother   . Hypertension Paternal Grandmother   . Hyperlipidemia Paternal Grandmother   . Heart disease Paternal Grandmother   . Chronic fatigue Mother   . ADD / ADHD Daughter   . Anxiety disorder Daughter   . Depression Daughter   . Colon cancer Neg Hx   . Esophageal cancer Neg Hx   . Rectal cancer Neg Hx   . Stomach cancer Neg Hx     Social History   Socioeconomic History  . Marital status: Divorced    Spouse name: Not on file  . Number of children: 1   . Years of education: grad schoo  . Highest education level: Not on file  Occupational History  .  Not on file  Social Needs  . Financial resource strain: Not on file  . Food insecurity    Worry: Not on file    Inability: Not on file  . Transportation needs    Medical: Not on file    Non-medical: Not on file  Tobacco Use  . Smoking status: Former Smoker    Quit date: 11/14/2001    Years since quitting: 17.7  . Smokeless tobacco: Never Used  Substance and Sexual Activity  . Alcohol use: No    Alcohol/week: 0.0 standard drinks    Comment: In Recovery  since 2006  . Drug use: No  . Sexual activity: Not Currently    Birth control/protection: Surgical  Lifestyle  . Physical activity    Days per week: Not on file    Minutes per session: Not on file  . Stress: Not on file  Relationships  . Social Herbalist on phone: Not on file    Gets together: Not on file    Attends religious service: Not on file    Active member of club or organization: Not on file    Attends meetings of clubs or organizations: Not  on file    Relationship status: Not on file  . Intimate partner violence    Fear of current or ex partner: Not on file    Emotionally abused: Not on file    Physically abused: Not on file    Forced sexual activity: Not on file  Other Topics Concern  . Not on file  Social History Narrative   Social Hx:   Current living situation: lives in [place] with mother and daughter   Born and Raised in [place] by both parents. 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   Kids: 1 daughter  (15yo)   Legal issues: denies   recovery since 2006.   Alcoholism age [age]; binge drinking; benzo abuse stopped in 2008. Pt goes to AA 3x/week      Drugs: none; previous marijuna use about 5 times in her life time      Tobacco: quit smoking in 2002; smoked x 25 years      Exercise: no formal exercise in 2018.      Outpatient Medications Prior to Visit  Medication Sig Dispense Refill  . lamoTRIgine (LAMICTAL) 200 MG tablet Take 1.5 tablets (300 mg total) by mouth daily. 135 tablet 1  . lisinopril (PRINIVIL,ZESTRIL) 10 MG tablet Take 1 tablet (10 mg total) by mouth daily. 90 tablet 1  . LYSINE PO Take 1 tablet by mouth daily.    . mirtazapine (REMERON) 45 MG tablet Take 1 tablet (45 mg total) by mouth at bedtime. 90 tablet 1  . Multiple Vitamin (MULTIVITAMIN) tablet Take 1 tablet by mouth daily.    . Na Sulfate-K Sulfate-Mg Sulf 17.5-3.13-1.6 GM/177ML SOLN Suprep (no substitutions)-TAKE AS DIRECTED. 354 mL 0  . Omega-3 Fatty Acids (OMEGA 3 PO) Take by mouth.    . Red Yeast Rice Extract (RED YEAST RICE PO) Take 1 tablet by mouth daily.    . ziprasidone (GEODON) 80 MG capsule Take 1 capsule (80 mg total) by mouth daily. 30  capsule  5   No facility-administered medications prior to visit.     Allergies  Allergen Reactions  . Azithromycin Nausea And Vomiting  . Codeine Nausea And Vomiting  . Other     Pt prefers not to have pain medication if not necessary- recovering alcoholic.   [place] [Pseudoephedrine Hcl]     "Creepy crawly skin"--if taken consistently  . Zoloft [Sertraline Hcl]     Due to bipolar disorder-makes her manic.   Marland Kitchen Penicillins Rash    Has patient had a PCN reaction causing immediate rash, facial/tongue/throat swelling, SOB or lightheadedness with hypotension: No Has patient had a PCN reaction causing severe rash involving mucus membranes or skin necrosis: No Has patient had a PCN reaction that required hospitalization: No Has patient had a PCN reaction occurring within the last 10 years: No If all of the above answers are "NO", then may proceed with Cephalosporin use.   . Sulfa Antibiotics Rash    ROS Review of Systems Review of Systems  Constitutional: Negative for activity change, appetite change, chills and fever.  HENT: Negative for congestion, nosebleeds, trouble swallowing and voice change.   Respiratory: Negative for cough, shortness of breath and wheezing.   Gastrointestinal: Negative for diarrhea, nausea and vomiting.  Genitourinary: Negative for difficulty urinating, dysuria, flank pain and hematuria.  Musculoskeletal: Negative for back pain, joint swelling and neck pain.  Neurological: Negative for dizziness, speech difficulty, light-headedness and numbness.  See HPI. All other review of systems negative.     Objective:    Physical Exam  BP 128/84 (BP Location: Right Arm, Patient Position: Sitting, Cuff Size: Normal)   Pulse 69   Temp 98.7 F (37.1 C) (Oral)   Resp 17   Ht 5' 4.75" (1.645 m)   Wt 140 lb 6.4 oz (63.7 kg)   SpO2 99%   BMI 23.54 kg/m  Wt Readings from Last 3 Encounters:  07/31/19 140 lb 6.4 oz (63.7 kg)  07/26/19 139 lb (63 kg)  07/23/19  140 lb 9.6 oz (63.8 kg)    Physical Exam  Constitutional: Oriented to person, place, and time. Appears well-developed and well-nourished.  HENT:  Head: Normocephalic and atraumatic.  Eyes: Conjunctivae and EOM are normal.  Cardiovascular: Normal rate, regular rhythm, normal heart sounds and intact distal pulses.  No murmur heard. Pulmonary/Chest: Effort normal and breath sounds normal. No stridor. No respiratory distress. Has no wheezes.  Neurological: Is alert and oriented to person, place, and time.  Skin: Skin is warm. Capillary refill takes less than 2 seconds.  Psychiatric: Has a normal mood and affect. Behavior is normal. Judgment and thought content normal.   Health Maintenance Due  Topic Date Due  . MAMMOGRAM  10/12/2018  . COLONOSCOPY  10/12/2018  . PAP SMEAR-Modifier  08/11/2019    There are no preventive care reminders to display for this patient.  Lab Results  Component Value Date   TSH 1.150 01/10/2019   Lab Results  Component Value Date   WBC 6.7 01/10/2019   HGB 13.0 01/10/2019   HCT 36.9 01/10/2019   MCV 88 01/10/2019   PLT 227 01/10/2019   Lab Results  Component Value Date   NA 142 07/23/2019   K 4.2 07/23/2019   CO2 25 07/23/2019   GLUCOSE 98 07/23/2019   BUN 15 07/23/2019   CREATININE 1.06 (H) 07/23/2019   BILITOT 0.3 07/23/2019   ALKPHOS 68 07/23/2019   AST 19 07/23/2019   ALT 16 07/23/2019   PROT 6.2 07/23/2019  ALBUMIN 4.4 07/23/2019   CALCIUM 9.3 07/23/2019   Lab Results  Component Value Date   CHOL 332 (H) 10/24/2018   Lab Results  Component Value Date   HDL 82 10/24/2018   Lab Results  Component Value Date   LDLCALC 232 (H) 10/24/2018   Lab Results  Component Value Date   TRIG 91 10/24/2018   Lab Results  Component Value Date   CHOLHDL 4.0 10/24/2018   Lab Results  Component Value Date   HGBA1C 5.5 10/24/2018      Assessment & Plan:   Problem List Items Addressed This Visit      Cardiovascular and Mediastinum    HTN (hypertension) (Chronic)- stable off lisinopril Continue drug holiday Will reassess in December at her physical exam      Other   Dizziness- resolved off lisinopril    Other Visit Diagnoses    Elevated serum creatinine    -  Primary Stabilized, no concerns      Meds ordered this encounter  Medications  . Zoster Vaccine Adjuvanted [facility]) injection    Sig: Inject 0.5 mLs into the muscle once for 1 dose.    Dispense:  0.5 mL    Refill:  1    Follow-up: No follow-ups on file.    Forrest Moron, MD

## 2019-08-05 ENCOUNTER — Telehealth: Payer: Self-pay | Admitting: Gastroenterology

## 2019-08-05 NOTE — Telephone Encounter (Signed)
Pt is scheduled for 08/10/19 colon and has questions regarding her diet.

## 2019-08-05 NOTE — Telephone Encounter (Signed)
Pt asking if she can eat fruits- instructed to her yes but would avoid blackberries or raspberries -

## 2019-08-06 ENCOUNTER — Other Ambulatory Visit: Payer: Self-pay | Admitting: Family Medicine

## 2019-08-06 DIAGNOSIS — Z1231 Encounter for screening mammogram for malignant neoplasm of breast: Secondary | ICD-10-CM

## 2019-08-09 ENCOUNTER — Telehealth: Payer: Self-pay | Admitting: Gastroenterology

## 2019-08-09 NOTE — Telephone Encounter (Signed)

## 2019-08-10 ENCOUNTER — Ambulatory Visit (AMBULATORY_SURGERY_CENTER): Payer: 59 | Admitting: Gastroenterology

## 2019-08-10 ENCOUNTER — Encounter: Payer: Self-pay | Admitting: Gastroenterology

## 2019-08-10 ENCOUNTER — Other Ambulatory Visit: Payer: Self-pay

## 2019-08-10 VITALS — BP 118/74 | HR 58 | Temp 98.5°F | Resp 13 | Ht 64.0 in | Wt 139.0 lb

## 2019-08-10 DIAGNOSIS — D128 Benign neoplasm of rectum: Secondary | ICD-10-CM

## 2019-08-10 DIAGNOSIS — Z1211 Encounter for screening for malignant neoplasm of colon: Secondary | ICD-10-CM | POA: Diagnosis not present

## 2019-08-10 MED ORDER — SODIUM CHLORIDE 0.9 % IV SOLN: 500.0000 mL | Freq: Once | INTRAVENOUS | Status: DC

## 2019-08-10 NOTE — Op Note (Addendum)
[facility] Patient Name: Nancy Bradshaw Procedure Date: 08/10/2019 8:41 AM MRN: [id number] Endoscopist: Jackquline Denmark , MD Age: 53 Referring MD:  Date of Birth: 08-26-1966 Gender: Female Account #: [id number] Procedure:                Colonoscopy Indications:              Screening for colorectal malignant neoplasm Medicines:                Monitored Anesthesia Care Procedure:                Pre-Anesthesia Assessment:                           - Prior to the procedure, a History and Physical                            was performed, and patient medications and                            allergies were reviewed. The patient's tolerance of                            previous anesthesia was also reviewed. The risks                            and benefits of the procedure and the sedation                            options and risks were discussed with the patient.                            All questions were answered, and informed consent                            was obtained. Prior Anticoagulants: The patient has                            taken no previous anticoagulant or antiplatelet                            agents. ASA Grade Assessment: I - A normal, healthy                            patient. After reviewing the risks and benefits,                            the patient was deemed in satisfactory condition to                            undergo the procedure.                           After obtaining informed consent, the colonoscope  was passed under direct vision. Throughout the                            procedure, the patient's blood pressure, pulse, and                            oxygen saturations were monitored continuously. The                            Colonoscope was introduced through the anus and                            advanced to the 2 cm into the ileum. The                            colonoscopy was performed without  difficulty. The                            patient tolerated the procedure well. The quality                            of the bowel preparation was good. The terminal                            ileum, ileocecal valve, appendiceal orifice, and                            rectum were photographed. Scope In: 8:46:21 AM Scope Out: 9:01:06 AM Scope Withdrawal Time: 0 hours 11 minutes 25 seconds  Total Procedure Duration: 0 hours 14 minutes 45 seconds  Findings:                 A 4 mm polyp was found in the rectum. The polyp was                            sessile. The polyp was removed with a cold biopsy                            forceps. Resection and retrieval were complete.                            Estimated blood loss: none.                           Non-bleeding internal hemorrhoids were found during                            retroflexion. The hemorrhoids were small.                           The terminal ileum appeared normal.                           The exam was otherwise without abnormality. Complications:  No immediate complications. Estimated Blood Loss:     Estimated blood loss: none. Impression:               -Small colonic polyp S/P polypectomy.                           -Minimal internal hemorrhoids.                           -Otherwise normal colonoscopy to TI. Recommendation:           - Patient has a contact number available for                            emergencies. The signs and symptoms of potential                            delayed complications were discussed with the                            patient. Return to normal activities tomorrow.                            Written discharge instructions were provided to the                            patient.                           - Resume previous diet.                           - Continue present medications.                           - Await pathology results.                           - Repeat  colonoscopy for surveillance based on                            pathology results.                           - D/W Nancy Lima, MD 08/10/2019 9:07:23 AM This report has been signed electronically.

## 2019-08-10 NOTE — Progress Notes (Signed)
PT taken to PACU. Monitors in place. VSS. Report given to RN. 

## 2019-08-10 NOTE — Progress Notes (Signed)
Pt's states no medical or surgical changes since previsit or office visit.  Covid- June Vitals- [facility]  

## 2019-08-10 NOTE — Patient Instructions (Signed)
Impression/Recomomendations:  Polyp handout given to patient. Hemorrhoid handout given to patient.  Resume previous diet. Continue present medications. Await pathology results.  Repeat colonoscopy for surveillance.  Date to be determined after pathology results reviewed.  YOU HAD AN ENDOSCOPIC PROCEDURE TODAY AT [facility]:   Refer to the procedure report that was given to you for any specific questions about what was found during the examination.  If the procedure report does not answer your questions, please call your gastroenterologist to clarify.  If you requested that your care partner not be given the details of your procedure findings, then the procedure report has been included in a sealed envelope for you to review at your convenience later.  YOU SHOULD EXPECT: Some feelings of bloating in the abdomen. Passage of more gas than usual.  Walking can help get rid of the air that was put into your GI tract during the procedure and reduce the bloating. If you had a lower endoscopy (such as a colonoscopy or flexible sigmoidoscopy) you may notice spotting of blood in your stool or on the toilet paper. If you underwent a bowel prep for your procedure, you may not have a normal bowel movement for a few days.  Please Note:  You might notice some irritation and congestion in your nose or some drainage.  This is from the oxygen used during your procedure.  There is no need for concern and it should clear up in a day or so.  SYMPTOMS TO REPORT IMMEDIATELY:   Following lower endoscopy (colonoscopy or flexible sigmoidoscopy):  Excessive amounts of blood in the stool  Significant tenderness or worsening of abdominal pains  Swelling of the abdomen that is new, acute  Fever of 100F or higher For urgent or emergent issues, a gastroenterologist can be reached at any hour by calling [phone].   DIET:  We do recommend a small meal at first, but then you may proceed to your  regular diet.  Drink plenty of fluids but you should avoid alcoholic beverages for 24 hours.  ACTIVITY:  You should plan to take it easy for the rest of today and you should NOT DRIVE or use heavy machinery until tomorrow (because of the sedation medicines used during the test).    FOLLOW UP: Our staff will call the number listed on your records 48-72 hours following your procedure to check on you and address any questions or concerns that you may have regarding the information given to you following your procedure. If we do not reach you, we will leave a message.  We will attempt to reach you two times.  During this call, we will ask if you have developed any symptoms of COVID 19. If you develop any symptoms (ie: fever, flu-like symptoms, shortness of breath, cough etc.) before then, please call [phone].  If you test positive for Covid 19 in the 2 weeks post procedure, please call and report this information to [place].    If any biopsies were taken you will be contacted by phone or by letter within the next 1-3 weeks.  Please call us at [phone] if you have not heard about the biopsies in 3 weeks.    SIGNATURES/CONFIDENTIALITY: You and/or your care partner have signed paperwork which will be entered into your electronic medical record.  These signatures attest to the fact that that the information above on your After Visit Summary has been reviewed and is understood.  Full responsibility of the confidentiality of this  discharge information lies with you and/or your care-partner. 

## 2019-08-13 ENCOUNTER — Telehealth: Payer: Self-pay

## 2019-08-13 NOTE — Telephone Encounter (Signed)
  Follow up Call-  Call back number 08/10/2019  Post procedure Call Back phone  # [phone]  Permission to leave phone message Yes  Some recent data might be hidden     Patient questions:  Do you have a fever, pain , or abdominal swelling? No. Pain Score  0 *  Have you tolerated food without any problems? Yes.    Have you been able to return to your normal activities? Yes.    Do you have any questions about your discharge instructions: Diet   No. Medications  No. Follow up visit  No.  Do you have questions or concerns about your Care? No.  Actions: * If pain score is 4 or above: No action needed, pain <4.  1. Have you developed a fever since your procedure? no  2.   Have you had an respiratory symptoms (SOB or cough) since your procedure? no  3.   Have you tested positive for COVID 19 since your procedure no  4.   Have you had any family members/close contacts diagnosed with the COVID 19 since your procedure?  no   If yes to any of these questions please route to Joylene John, RN and Alphonsa Gin, Therapist, sports.

## 2019-08-18 ENCOUNTER — Encounter: Payer: Self-pay | Admitting: Gastroenterology

## 2019-09-05 ENCOUNTER — Ambulatory Visit (INDEPENDENT_AMBULATORY_CARE_PROVIDER_SITE_OTHER): Payer: 59 | Admitting: Psychiatry

## 2019-09-05 ENCOUNTER — Encounter (HOSPITAL_COMMUNITY): Payer: Self-pay | Admitting: Psychiatry

## 2019-09-05 ENCOUNTER — Other Ambulatory Visit: Payer: Self-pay

## 2019-09-05 DIAGNOSIS — F4310 Post-traumatic stress disorder, unspecified: Secondary | ICD-10-CM | POA: Diagnosis not present

## 2019-09-05 DIAGNOSIS — F3181 Bipolar II disorder: Secondary | ICD-10-CM | POA: Diagnosis not present

## 2019-09-05 DIAGNOSIS — F411 Generalized anxiety disorder: Secondary | ICD-10-CM | POA: Diagnosis not present

## 2019-09-05 MED ORDER — MIRTAZAPINE 45 MG PO TABS: 45.0000 mg | ORAL_TABLET | Freq: Every day | ORAL | 1 refills | Status: DC

## 2019-09-05 MED ORDER — LAMOTRIGINE 200 MG PO TABS: 300.0000 mg | ORAL_TABLET | Freq: Every day | ORAL | 1 refills | Status: DC

## 2019-09-05 MED ORDER — ZIPRASIDONE HCL 80 MG PO CAPS: 80.0000 mg | ORAL_CAPSULE | Freq: Every day | ORAL | 1 refills | Status: DC

## 2019-09-05 NOTE — Progress Notes (Signed)
  Virtual Visit via Telephone Note  I connected with CORENA DEBOSE  on 09/05/19 at  4:00 PM EDT by telephone and verified that I am speaking with the correct person using two identifiers.  Location: Patient: office Provider: office   I discussed the limitations, risks, security and privacy concerns of performing an evaluation and management service by telephone and the availability of in person appointments. I also discussed with the patient that there may be a patient responsible charge related to this service. The patient expressed understanding and agreed to proceed.   History of Present Illness: "I am doing alright. I feel pretty good". Work continues to be very stressful. She spends her free time doing yoga, hiking and exercise. She meditates and has good supports. Pt denies any depression. She has some random days where she feels down and doesn't want to get out of bed. Usually she is able to get herself up and out to do what she needs to. In the summer she will randomly have a day where she wakes up at 3am and will have energy and be very goal oriented. She denies any other symptoms of mania and hypomania. Her anxiety is mostly centered on work. Her PCP stopped her BP meds and pt is no longer dizzy. Her BP has remained stable and her kidney function is improving. Pt is now concerned about her elevated cholerestol. It has always been elevated but now it is very high. She is managing her diet and is exercising but her cholesterol remains high. She does not know what to do. The Geodon has been good for her. It has kept her mood stable. Pt looked into other mood stabilizers and antipsychotics. She found that most have similar SE. Other meds are too expensive. [name] denies any alcohol use.     Observations/Objective: I spoke with [name] on the phone.  Pt was calm, pleasant and cooperative.  Pt was engaged in the conversation and answered questions appropriately.  Speech was  clear and coherent with normal rate, tone and volume.  Mood is euthymic and affect is full. Thought processes are coherent and circumstantial.  Thought content is with ruminations.  Pt denies SI/HI.   Pt denies auditory and visual hallucinations and did not appear to be responding to internal stimuli.  Memory and concentration are good.  Fund of knowledge and use of language are average.  Insight and judgment are fair.  I am unable to comment on psychomotor activity, general appearance, hygiene, or eye contact as I was unable to physically see the patient on the phone.  Vital signs not available since interview conducted virtually.    I reviewed the information Assessment and Plan: Bipolar II d/o; PTSD; GAD; alcohol use d/o in remission   Geodon 80mg  po qD Remeron 45mg  po qD Lamictal 300mg  po qD  Pt does not want meds changed at this time.      Follow Up Instructions: In 6 months or sooner if needed   I discussed the assessment and treatment plan with the patient. The patient was provided an opportunity to ask questions and all were answered. The patient agreed with the plan and demonstrated an understanding of the instructions.   The patient was advised to call back or seek an in-person evaluation if the symptoms worsen or if the condition fails to improve as anticipated.  I provided 30 minutes of non-face-to-face time during this encounter.   Charlcie Cradle, MD

## 2019-09-20 ENCOUNTER — Ambulatory Visit
Admission: RE | Admit: 2019-09-20 | Discharge: 2019-09-20 | Disposition: A | Discharge status: Home / Self Care | Payer: 59 | Source: Ambulatory Visit | Attending: Family Medicine | Admitting: Family Medicine

## 2019-09-20 ENCOUNTER — Other Ambulatory Visit: Payer: Self-pay

## 2019-09-20 DIAGNOSIS — Z1231 Encounter for screening mammogram for malignant neoplasm of breast: Secondary | ICD-10-CM

## 2019-11-01 ENCOUNTER — Other Ambulatory Visit (HOSPITAL_COMMUNITY)
Admission: RE | Admit: 2019-11-01 | Discharge: 2019-11-01 | Disposition: A | Discharge status: Home / Self Care | Payer: 59 | Source: Ambulatory Visit | Attending: Family Medicine | Admitting: Family Medicine

## 2019-11-01 ENCOUNTER — Encounter: Payer: Self-pay | Admitting: Family Medicine

## 2019-11-01 ENCOUNTER — Ambulatory Visit (INDEPENDENT_AMBULATORY_CARE_PROVIDER_SITE_OTHER): Payer: 59 | Admitting: Family Medicine

## 2019-11-01 ENCOUNTER — Other Ambulatory Visit: Payer: Self-pay

## 2019-11-01 VITALS — BP 134/84 | HR 72 | Temp 97.5°F | Resp 16 | Ht 64.0 in | Wt 144.2 lb

## 2019-11-01 DIAGNOSIS — Z0001 Encounter for general adult medical examination with abnormal findings: Secondary | ICD-10-CM | POA: Diagnosis not present

## 2019-11-01 DIAGNOSIS — Z5329 Procedure and treatment not carried out because of patient's decision for other reasons: Secondary | ICD-10-CM

## 2019-11-01 DIAGNOSIS — Z124 Encounter for screening for malignant neoplasm of cervix: Secondary | ICD-10-CM | POA: Diagnosis present

## 2019-11-01 DIAGNOSIS — Z5320 Procedure and treatment not carried out because of patient's decision for unspecified reasons: Secondary | ICD-10-CM

## 2019-11-01 DIAGNOSIS — I10 Essential (primary) hypertension: Secondary | ICD-10-CM

## 2019-11-01 DIAGNOSIS — R7989 Other specified abnormal findings of blood chemistry: Secondary | ICD-10-CM | POA: Diagnosis not present

## 2019-11-01 DIAGNOSIS — E7800 Pure hypercholesterolemia, unspecified: Secondary | ICD-10-CM

## 2019-11-01 DIAGNOSIS — Z0000 Encounter for general adult medical examination without abnormal findings: Secondary | ICD-10-CM

## 2019-11-01 MED ORDER — LISINOPRIL 2.5 MG PO TABS: 2.5000 mg | ORAL_TABLET | Freq: Every day | ORAL | 0 refills | Status: DC

## 2019-11-01 NOTE — Progress Notes (Addendum)
Chief Complaint  Patient presents with  . Annual Exam    cpe with pap  . cholesteol    has some concerns and questions about cholesterol    Subjective:  Nancy Bradshaw is a 53 y.o. female here for a health maintenance visit.  Patient is established pt  Patient Active Problem List   Diagnosis Date Noted  . Dizziness 01/10/2019  . Esophageal reflux 10/28/2015  . Obesity (BMI 30.0-34.9) 10/28/2015  . PTSD (post-traumatic stress disorder) 10/15/2014  . Hyperlipidemia 10/15/2014  . Family history of hypothyroidism 10/15/2014  . Bipolar disorder ([place]) 06/21/2013  . Personal history of alcoholism ([place]) 07/20/2012  . HTN (hypertension) 07/20/2012    Past Medical History:  Diagnosis Date  . Alcohol abuse   . Allergy   . Anxiety Dx 2006  . Bipolar 2 disorder [facility]) Dx 2008   [facility] 2011-2017  . Heart murmur    Dx at age of 59  . Hx of dysmenorrhea 07/20/2012   Seasonale prescribed for severe dysmenorrhea and heavy bleeding with clotting; on OCP, menses lasts 4 days with light bleeding.   Marland Kitchen Hyperlipidemia    Dx at age of 80  . Hypertension Dx 2009  . Post-operative nausea and vomiting   . PTSD (post-traumatic stress disorder) Dx 2008  . Sleep apnea    mouth piece  . Substance abuse ([place])    No alcohol since 2008    Past Surgical History:  Procedure Laterality Date  . APPENDECTOMY  2012  . ENDOMETRIAL ABLATION  02/2014   . TUBAL LIGATION  02/2014      Outpatient Medications Prior to Visit  Medication Sig Dispense Refill  . lamoTRIgine (LAMICTAL) 200 MG tablet Take 1.5 tablets (300 mg total) by mouth daily. 135 tablet 1  . LYSINE PO Take 1 tablet by mouth daily.    . mirtazapine (REMERON) 45 MG tablet Take 1 tablet (45 mg total) by mouth at bedtime. 90 tablet 1  . Multiple Vitamin (MULTIVITAMIN) tablet Take 1 tablet by mouth daily.    . Omega-3 Fatty Acids (OMEGA 3 PO) Take by mouth.    . Red Yeast Rice Extract (RED YEAST RICE PO) Take 1 tablet by mouth  daily.    . ziprasidone (GEODON) 80 MG capsule Take 1 capsule (80 mg total) by mouth daily. 90 capsule 1  . lisinopril (PRINIVIL,ZESTRIL) 10 MG tablet Take 1 tablet (10 mg total) by mouth daily. 90 tablet 1   No facility-administered medications prior to visit.    Allergies  Allergen Reactions  . Azithromycin Nausea And Vomiting  . Codeine Nausea And Vomiting  . Other     Pt prefers not to have pain medication if not necessary- recovering alcoholic.   Ebbie Ridge [Pseudoephedrine Hcl]     "Creepy crawly skin"--if taken consistently  . Zoloft [Sertraline Hcl]     Due to bipolar disorder-makes her manic.   Marland Kitchen Penicillins Rash    Has patient had a PCN reaction causing immediate rash, facial/tongue/throat swelling, SOB or lightheadedness with hypotension: No Has patient had a PCN reaction causing severe rash involving mucus membranes or skin necrosis: No Has patient had a PCN reaction that required hospitalization: No Has patient had a PCN reaction occurring within the last 10 years: No If all of the above answers are "NO", then may proceed with Cephalosporin use.   . Sulfa Antibiotics Rash     Family History  Problem Relation Age of Onset  . Hypertension Father   .  Hyperlipidemia Father   . Cancer Father 79       CLL  . Anxiety disorder Father   . Depression Father   . Heart disease Father 66       CHF  . Colon polyps Father   . Hypertension Maternal Grandmother   . Hypothyroidism Maternal Grandmother        and great aunts   . Cancer Maternal Grandmother 74       on spinal cord, rare   . Hypertension Maternal Grandfather   . Bipolar disorder Paternal Grandmother   . Hypertension Paternal Grandmother   . Hyperlipidemia Paternal Grandmother   . Heart disease Paternal Grandmother   . Chronic fatigue Mother   . ADD / ADHD Daughter   . Anxiety disorder Daughter   . Depression Daughter   . Colon cancer Neg Hx   . Esophageal cancer Neg Hx   . Rectal cancer Neg Hx   .  Stomach cancer Neg Hx      Health Habits: Dental Exam: up to date Eye Exam: up to date Exercise: 0 times/week on average due to weather Current exercise activities: walking/running Diet: DASH   Social History   Socioeconomic History  . Marital status: Divorced    Spouse name: Not on file  . Number of children: 1   . Years of education: grad schoo  . Highest education level: Not on file  Occupational History  . Not on file  Tobacco Use  . Smoking status: Former Smoker    Quit date: 11/14/2001    Years since quitting: 17.9  . Smokeless tobacco: Never Used  Substance and Sexual Activity  . Alcohol use: No    Alcohol/week: 0.0 standard drinks    Comment: In Recovery  since 2006  . Drug use: No  . Sexual activity: Not Currently    Birth control/protection: Surgical  Other Topics Concern  . Not on file  Social History Narrative   Social Hx:   Current living situation: lives in [place] with mother and daughter   Born and Raised in [place] by both parents. 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   Kids: 1 daughter  (15yo)   Legal issues: denies   recovery since 2006.   Alcoholism age [age]; binge drinking; benzo abuse stopped in 2008. Pt goes to AA 3x/week      Drugs: none; previous marijuna use about 5 times in her life time      Tobacco: quit smoking in 2002; smoked x 25 years      Exercise: no formal exercise in 2018.     Social Determinants of Health   Financial Resource Strain:   . Difficulty of Paying Living Expenses: Not on file  Food Insecurity:   . Worried About Charity fundraiser in the Last Year: Not on file  . Ran Out of Food in the Last Year: Not on file  Transportation  Needs:   . Lack of Transportation (Medical): Not on file  . Lack of Transportation (Non-Medical): Not on file  Physical Activity:   . Days of Exercise per Week: Not on file  . Minutes of Exercise per Session: Not on file  Stress:   . Feeling of Stress : Not on file  Social Connections:   . Frequency of Communication with Friends and Family: Not on file  . Frequency of Social Gatherings with Friends and Family: Not on file  . Attends Religious Services: Not on file  . Active Member of Clubs or Organizations: Not on file  . Attends Archivist Meetings: Not on file  . Marital Status: Not on file  Intimate Partner Violence:   . Fear of Current or Ex-Partner: Not on file  . Emotionally Abused: Not on file  . Physically Abused: Not on file  . Sexually Abused: Not on file   Social History   Substance and Sexual Activity  Alcohol Use No  . Alcohol/week: 0.0 standard drinks   Comment: In Recovery  since 2006   Social History   Tobacco Use  Smoking Status Former Smoker  . Quit date: 11/14/2001  . Years since quitting: 17.9  Smokeless Tobacco Never Used   Social History   Substance and Sexual Activity  Drug Use No    GYN: Sexual Health Menstrual status: regular menses LMP: No LMP recorded. Patient has had an ablation. Last pap smear: see HM section History of abnormal pap smears:  Sexually active: not currently  Current contraception: none  Health Maintenance: See under health Maintenance activity for review of completion dates as well. Immunization History  Administered Date(s) Administered  . Hepatitis A, Adult 03/30/2014, 08/10/2016  . Influenza Inj Mdck Quad Pf 09/20/2017  . Influenza,inj,Quad PF,6+ Mos 08/18/2014, 09/21/2015, 08/10/2016, 09/20/2017, 09/19/2018, 07/23/2019  . Influenza-Unspecified 08/14/2017  . Pneumococcal Polysaccharide-23 10/15/2014  . Td 07/17/2002  . Tdap 07/11/2008, 02/04/2019  . Zoster Recombinat (Shingrix) 08/21/2019, 10/29/2019        Depression Screen-PHQ2/9 Depression screen [facility] 2/9 11/01/2019 07/31/2019 05/01/2019 02/04/2019 01/10/2019  Decreased Interest 0 0 0 0 0  Down, Depressed, Hopeless 0 0 0 0 0  PHQ - 2 Score 0 0 0 0 0       Depression Severity and Treatment Recommendations:  0-4= None  5-9= Mild / Treatment: Support, educate to call if worse; return in one month  10-14= Moderate / Treatment: Support, watchful waiting; Antidepressant or Psycotherapy  15-19= Moderately severe / Treatment: Antidepressant OR Psychotherapy  >= 20 = Major depression, severe / Antidepressant AND Psychotherapy    Review of Systems   ROS  See HPI for ROS as well.   Review of Systems  Constitutional: Negative for activity change, appetite change, chills and fever.  HENT: Negative for congestion, nosebleeds, trouble swallowing and voice change.   Respiratory: Negative for cough, shortness of breath and wheezing.   Gastrointestinal: Negative for diarrhea, nausea and vomiting.  Genitourinary: Negative for difficulty urinating, dysuria, flank pain and hematuria.  Musculoskeletal: Negative for back pain, joint swelling and neck pain.  Neurological: Negative for dizziness, speech difficulty, light-headedness and numbness.  See HPI. All other review of systems negative.    Objective:   Vitals:   11/01/19 0812  BP: 134/84  Pulse: 72  Resp: 16  Temp: (!) 97.5 F (36.4 C)  TempSrc: Oral  SpO2: 99%  Weight: 144 lb 3.2 oz (65.4 kg)  Height: 5\' 4"  (1.626 m)    Body mass index is 24.75 kg/m.  Physical Exam Constitutional:      Appearance: Normal appearance. She is normal weight.  HENT:     Head: Normocephalic and atraumatic.     Right Ear: Tympanic membrane, ear canal and external ear normal.  Left Ear: Tympanic membrane, ear canal and external ear normal.     Nose: Nose normal. No congestion.  Eyes:     Extraocular Movements: Extraocular movements intact.     Conjunctiva/sclera: Conjunctivae normal.   Cardiovascular:     Rate and Rhythm: Normal rate and regular rhythm.     Pulses: Normal pulses.     Heart sounds: No murmur.  Pulmonary:     Effort: Pulmonary effort is normal. No respiratory distress.     Breath sounds: Normal breath sounds. No stridor. No wheezing, rhonchi or rales.  Chest:     Chest wall: No tenderness.  Abdominal:     General: Abdomen is flat. Bowel sounds are normal. There is no distension.     Palpations: Abdomen is soft. There is no mass.     Tenderness: There is no abdominal tenderness. There is no right CVA tenderness, left CVA tenderness, guarding or rebound.     Hernia: No hernia is present.  Musculoskeletal:        General: No swelling, tenderness, deformity or signs of injury. Normal range of motion.     Cervical back: Normal range of motion and neck supple.     Right lower leg: No edema.     Left lower leg: No edema.  Skin:    General: Skin is warm.     Coloration: Skin is not jaundiced.     Findings: No bruising, erythema or rash.  Neurological:     General: No focal deficit present.     Mental Status: She is alert and oriented to person, place, and time. Mental status is at baseline.  Psychiatric:        Mood and Affect: Mood normal.        Behavior: Behavior normal.        Thought Content: Thought content normal.        Judgment: Judgment normal.   Chaperone Present normal external genitalia, tanner stage 5, speculum exam performed, normal vaginal mucosa, cervix with normal mucosa without discharge, pap smear performed and sent to pathology.  Bimanual exam performed and there is no CMT.  Uterus palpated without palpable ovarian mass.      Assessment/Plan:   Patient was seen for a health maintenance exam.  Counseled the patient on health maintenance issues. Reviewed her health mainteance schedule and ordered appropriate tests (see orders.) Counseled on regular exercise and weight management. Recommend regular eye exams and dental cleaning.    The following issues were addressed today for health maintenance:   Jaeliana was seen today for annual exam and cholesteol.  Diagnoses and all orders for this visit:  Encounter for health maintenance examination in adult - Women's Health Maintenance Plan Advised monthly breast exam and annual mammogram Advised dental exam every six months Discussed stress management Discussed pap smear screening guidelines   Screening for cervical cancer- discussed cervical cancer screening -     Cytology - PAP([place])  Essential hypertension- discussed that she should take lisinopril at 2.5mg  dose since 10mg  caused dizziness She should continue to exercise and DASH diet Discussed that she should check her bp 3x a week and take lisinopril if bp is >140/90 on two readings. Follow up in six month. -     Comprehensive metabolic panel  Elevated serum creatinine -     Comprehensive metabolic panel  Pure hypercholesterolemia -     Lipid panel -     AMB [facility]  Refusal of statin  medication by patient-  Discussed the benefits of statin and patient is refusing. Will refer to [facility] for discussion  -     AMB Referral to [facility]  Other orders -     lisinopril (ZESTRIL) 2.5 MG tablet; Take 1 tablet (2.5 mg total) by mouth daily. Do not take if blood pressure is less than 130/80.    No follow-ups on file.    Body mass index is 24.75 kg/m.:  Discussed the patient's BMI with patient. The BMI body mass index is 24.75 kg/m.     No future appointments.  Patient Instructions       If you have lab work done today you will be contacted with your lab results within the next 2 weeks.  If you have not heard from [place] then please contact us. The fastest way to get your results is to register for My Chart.   IF you received an x-ray today, you will receive an invoice from [facility]. Please contact [facility]  at [phone] with questions or concerns regarding your invoice.   IF you received labwork today, you will receive an invoice from [place]. Please contact [facility] at [phone] with questions or concerns regarding your invoice.   Our billing staff will not be able to assist you with questions regarding bills from these companies.  You will be contacted with the lab results as soon as they are available. The fastest way to get your results is to activate your My Chart account. Instructions are located on the last page of this paperwork. If you have not heard from [place] regarding the results in 2 weeks, please contact this office.     Managing Your Hypertension Hypertension is commonly called high blood pressure. This is when the force of your blood pressing against the walls of your arteries is too strong. Arteries are blood vessels that carry blood from your heart throughout your body. Hypertension forces the heart to work harder to pump blood, and may cause the arteries to become narrow or stiff. Having untreated or uncontrolled hypertension can cause heart attack, stroke, kidney disease, and other problems. What are blood pressure readings? A blood pressure reading consists of a higher number over a lower number. Ideally, your blood pressure should be below 120/80. The first ("top") number is called the systolic pressure. It is a measure of the pressure in your arteries as your heart beats. The second ("bottom") number is called the diastolic pressure. It is a measure of the pressure in your arteries as the heart relaxes. What does my blood pressure reading mean? Blood pressure is classified into four stages. Based on your blood pressure reading, your health care provider may use the following stages to determine what type of treatment you need, if any. Systolic pressure and diastolic pressure are measured in a unit called mm Hg. Normal  Systolic pressure: below 123456.  Diastolic pressure:  below 80. Elevated  Systolic pressure: [id number].  Diastolic pressure: below 80. Hypertension stage 1  Systolic pressure: 0000000.  Diastolic pressure: [id number]. Hypertension stage 2  Systolic pressure: [id number] or above.  Diastolic pressure: 90 or above. What health risks are associated with hypertension? Managing your hypertension is an important responsibility. Uncontrolled hypertension can lead to:  A heart attack.  A stroke.  A weakened blood vessel (aneurysm).  Heart failure.  Kidney damage.  Eye damage.  Metabolic syndrome.  Memory and concentration problems. What changes can I make to manage my hypertension? Hypertension can  be managed by making lifestyle changes and possibly by taking medicines. Your health care provider will help you make a plan to bring your blood pressure within a normal range. Eating and drinking   Eat a diet that is high in fiber and potassium, and low in salt (sodium), added sugar, and fat. An example eating plan is called the DASH (Dietary Approaches to Stop Hypertension) diet. To eat this way: ? Eat plenty of fresh fruits and vegetables. Try to fill half of your plate at each meal with fruits and vegetables. ? Eat whole grains, such as whole wheat pasta, brown rice, or whole grain bread. Fill about one quarter of your plate with whole grains. ? Eat low-fat diary products. ? Avoid fatty cuts of meat, processed or cured meats, and poultry with skin. Fill about one quarter of your plate with lean proteins such as fish, chicken without skin, beans, eggs, and tofu. ? Avoid premade and processed foods. These tend to be higher in sodium, added sugar, and fat.  Reduce your daily sodium intake. Most people with hypertension should eat less than 1,500 mg of sodium a day.  Limit alcohol intake to no more than 1 drink a day for nonpregnant women and 2 drinks a day for men. One drink equals 12 oz of beer, 5 oz of wine, or 1 oz of hard  liquor. Lifestyle  Work with your health care provider to maintain a healthy body weight, or to lose weight. Ask what an ideal weight is for you.  Get at least 30 minutes of exercise that causes your heart to beat faster (aerobic exercise) most days of the week. Activities may include walking, swimming, or biking.  Include exercise to strengthen your muscles (resistance exercise), such as weight lifting, as part of your weekly exercise routine. Try to do these types of exercises for 30 minutes at least 3 days a week.  Do not use any products that contain nicotine or tobacco, such as cigarettes and e-cigarettes. If you need help quitting, ask your health care provider.  Control any long-term (chronic) conditions you have, such as high cholesterol or diabetes. Monitoring  Monitor your blood pressure at home as told by your health care provider. Your personal target blood pressure may vary depending on your medical conditions, your age, and other factors.  Have your blood pressure checked regularly, as often as told by your health care provider. Working with your health care provider  Review all the medicines you take with your health care provider because there may be side effects or interactions.  Talk with your health care provider about your diet, exercise habits, and other lifestyle factors that may be contributing to hypertension.  Visit your health care provider regularly. Your health care provider can help you create and adjust your plan for managing hypertension. Will I need medicine to control my blood pressure? Your health care provider may prescribe medicine if lifestyle changes are not enough to get your blood pressure under control, and if:  Your systolic blood pressure is 130 or higher.  Your diastolic blood pressure is 80 or higher. Take medicines only as told by your health care provider. Follow the directions carefully. Blood pressure medicines must be taken as  prescribed. The medicine does not work as well when you skip doses. Skipping doses also puts you at risk for problems. Contact a health care provider if:  You think you are having a reaction to medicines you have taken.  You have repeated (recurrent)  headaches.  You feel dizzy.  You have swelling in your ankles.  You have trouble with your vision. Get help right away if:  You develop a severe headache or confusion.  You have unusual weakness or numbness, or you feel faint.  You have severe pain in your chest or abdomen.  You vomit repeatedly.  You have trouble breathing. Summary  Hypertension is when the force of blood pumping through your arteries is too strong. If this condition is not controlled, it may put you at risk for serious complications.  Your personal target blood pressure may vary depending on your medical conditions, your age, and other factors. For most people, a normal blood pressure is less than 120/80.  Hypertension is managed by lifestyle changes, medicines, or both. Lifestyle changes include weight loss, eating a healthy, low-sodium diet, exercising more, and limiting alcohol. This information is not intended to replace advice given to you by your health care provider. Make sure you discuss any questions you have with your health care provider. Document Released: 07/25/2012 Document Revised: 02/22/2019 Document Reviewed: 09/28/2016 Elsevier Patient Education  2020 [employer].

## 2019-11-01 NOTE — Patient Instructions (Addendum)
   If you have lab work done today you will be contacted with your lab results within the next 2 weeks.  If you have not heard from us then please contact us. The fastest way to get your results is to register for My Chart.   IF you received an x-ray today, you will receive an invoice from [facility]. Please contact [facility] at [phone] with questions or concerns regarding your invoice.   IF you received labwork today, you will receive an invoice from [facility]. Please contact [facility] at [phone] with questions or concerns regarding your invoice.   Our billing staff will not be able to assist you with questions regarding bills from these companies.  You will be contacted with the lab results as soon as they are available. The fastest way to get your results is to activate your My Chart account. Instructions are located on the last page of this paperwork. If you have not heard from us regarding the results in 2 weeks, please contact this office.     Managing Your Hypertension Hypertension is commonly called high blood pressure. This is when the force of your blood pressing against the walls of your arteries is too strong. Arteries are blood vessels that carry blood from your heart throughout your body. Hypertension forces the heart to work harder to pump blood, and may cause the arteries to become narrow or stiff. Having untreated or uncontrolled hypertension can cause heart attack, stroke, kidney disease, and other problems. What are blood pressure readings? A blood pressure reading consists of a higher number over a lower number. Ideally, your blood pressure should be below 120/80. The first ("top") number is called the systolic pressure. It is a measure of the pressure in your arteries as your heart beats. The second ("bottom") number is called the diastolic pressure. It is a measure of the pressure in your arteries as the heart relaxes. What does my blood  pressure reading mean? Blood pressure is classified into four stages. Based on your blood pressure reading, your health care provider may use the following stages to determine what type of treatment you need, if any. Systolic pressure and diastolic pressure are measured in a unit called mm Hg. Normal  Systolic pressure: below 120.  Diastolic pressure: below 80. Elevated  Systolic pressure: 120-129.  Diastolic pressure: below 80. Hypertension stage 1  Systolic pressure: 130-139.  Diastolic pressure: 80-89. Hypertension stage 2  Systolic pressure: 140 or above.  Diastolic pressure: 90 or above. What health risks are associated with hypertension? Managing your hypertension is an important responsibility. Uncontrolled hypertension can lead to:  A heart attack.  A stroke.  A weakened blood vessel (aneurysm).  Heart failure.  Kidney damage.  Eye damage.  Metabolic syndrome.  Memory and concentration problems. What changes can I make to manage my hypertension? Hypertension can be managed by making lifestyle changes and possibly by taking medicines. Your health care provider will help you make a plan to bring your blood pressure within a normal range. Eating and drinking   Eat a diet that is high in fiber and potassium, and low in salt (sodium), added sugar, and fat. An example eating plan is called the DASH (Dietary Approaches to Stop Hypertension) diet. To eat this way: ? Eat plenty of fresh fruits and vegetables. Try to fill half of your plate at each meal with fruits and vegetables. ? Eat whole grains, such as whole wheat pasta, brown rice, or whole grain bread. Fill   about one quarter of your plate with whole grains. ? Eat low-fat diary products. ? Avoid fatty cuts of meat, processed or cured meats, and poultry with skin. Fill about one quarter of your plate with lean proteins such as fish, chicken without skin, beans, eggs, and tofu. ? Avoid premade and processed foods.  These tend to be higher in sodium, added sugar, and fat.  Reduce your daily sodium intake. Most people with hypertension should eat less than 1,500 mg of sodium a day.  Limit alcohol intake to no more than 1 drink a day for nonpregnant women and 2 drinks a day for men. One drink equals 12 oz of beer, 5 oz of wine, or 1 oz of hard liquor. Lifestyle  Work with your health care provider to maintain a healthy body weight, or to lose weight. Ask what an ideal weight is for you.  Get at least 30 minutes of exercise that causes your heart to beat faster (aerobic exercise) most days of the week. Activities may include walking, swimming, or biking.  Include exercise to strengthen your muscles (resistance exercise), such as weight lifting, as part of your weekly exercise routine. Try to do these types of exercises for 30 minutes at least 3 days a week.  Do not use any products that contain nicotine or tobacco, such as cigarettes and e-cigarettes. If you need help quitting, ask your health care provider.  Control any long-term (chronic) conditions you have, such as high cholesterol or diabetes. Monitoring  Monitor your blood pressure at home as told by your health care provider. Your personal target blood pressure may vary depending on your medical conditions, your age, and other factors.  Have your blood pressure checked regularly, as often as told by your health care provider. Working with your health care provider  Review all the medicines you take with your health care provider because there may be side effects or interactions.  Talk with your health care provider about your diet, exercise habits, and other lifestyle factors that may be contributing to hypertension.  Visit your health care provider regularly. Your health care provider can help you create and adjust your plan for managing hypertension. Will I need medicine to control my blood pressure? Your health care provider may prescribe  medicine if lifestyle changes are not enough to get your blood pressure under control, and if:  Your systolic blood pressure is 130 or higher.  Your diastolic blood pressure is 80 or higher. Take medicines only as told by your health care provider. Follow the directions carefully. Blood pressure medicines must be taken as prescribed. The medicine does not work as well when you skip doses. Skipping doses also puts you at risk for problems. Contact a health care provider if:  You think you are having a reaction to medicines you have taken.  You have repeated (recurrent) headaches.  You feel dizzy.  You have swelling in your ankles.  You have trouble with your vision. Get help right away if:  You develop a severe headache or confusion.  You have unusual weakness or numbness, or you feel faint.  You have severe pain in your chest or abdomen.  You vomit repeatedly.  You have trouble breathing. Summary  Hypertension is when the force of blood pumping through your arteries is too strong. If this condition is not controlled, it may put you at risk for serious complications.  Your personal target blood pressure may vary depending on your medical conditions, your age, and other   factors. For most people, a normal blood pressure is less than 120/80.  Hypertension is managed by lifestyle changes, medicines, or both. Lifestyle changes include weight loss, eating a healthy, low-sodium diet, exercising more, and limiting alcohol. This information is not intended to replace advice given to you by your health care provider. Make sure you discuss any questions you have with your health care provider. Document Released: 07/25/2012 Document Revised: 02/22/2019 Document Reviewed: 09/28/2016 Elsevier Patient Education  2020 [employer].

## 2019-11-02 LAB — LIPID PANEL
Chol/HDL Ratio: 3.2 ratio (ref 0.0–4.4)
Cholesterol, Total: 279 mg/dL — ABNORMAL HIGH (ref 100–199)
HDL: 86 mg/dL (ref >39)
LDL Chol Calc (NIH): 182 mg/dL — ABNORMAL HIGH (ref 0–99)
Triglycerides: 72 mg/dL (ref 0–149)
VLDL Cholesterol Cal: 11 mg/dL (ref 5–40)

## 2019-11-02 LAB — COMPREHENSIVE METABOLIC PANEL
ALT: 14 IU/L (ref 0–32)
AST: 19 IU/L (ref 0–40)
Albumin/Globulin Ratio: 2.3 — ABNORMAL HIGH (ref 1.2–2.2)
Albumin: 4.6 g/dL (ref 3.8–4.9)
Alkaline Phosphatase: 79 IU/L (ref 39–117)
BUN/Creatinine Ratio: 13 (ref 9–23)
BUN: 12 mg/dL (ref 6–24)
Bilirubin Total: 0.5 mg/dL (ref 0.0–1.2)
CO2: 23 mmol/L (ref 20–29)
Calcium: 9.5 mg/dL (ref 8.7–10.2)
Chloride: 103 mmol/L (ref 96–106)
Creatinine, Ser: 0.93 mg/dL (ref 0.57–1.00)
GFR calc Af Amer: 82 mL/min/{1.73_m2} (ref >59)
GFR calc non Af Amer: 71 mL/min/{1.73_m2} (ref >59)
Globulin, Total: 2 g/dL (ref 1.5–4.5)
Glucose: 88 mg/dL (ref 65–99)
Potassium: 4 mmol/L (ref 3.5–5.2)
Sodium: 142 mmol/L (ref 134–144)
Total Protein: 6.6 g/dL (ref 6.0–8.5)

## 2019-11-04 ENCOUNTER — Ambulatory Visit: Payer: 59 | Attending: Internal Medicine

## 2019-11-04 ENCOUNTER — Other Ambulatory Visit: Payer: Self-pay

## 2019-11-04 ENCOUNTER — Encounter: Payer: Self-pay | Admitting: Family Medicine

## 2019-11-04 DIAGNOSIS — Z20822 Contact with and (suspected) exposure to covid-19: Secondary | ICD-10-CM

## 2019-11-04 LAB — CYTOLOGY - PAP
Chlamydia: NEGATIVE
Comment: NEGATIVE
Comment: NEGATIVE
Comment: NORMAL
Diagnosis: NEGATIVE
High risk HPV: NEGATIVE
Neisseria Gonorrhea: NEGATIVE

## 2019-11-05 LAB — NOVEL CORONAVIRUS, NAA: SARS-CoV-2, NAA: NOT DETECTED

## 2019-11-06 ENCOUNTER — Ambulatory Visit: Payer: Self-pay

## 2019-11-06 ENCOUNTER — Telehealth: Payer: Self-pay | Admitting: Family Medicine

## 2019-11-06 NOTE — Telephone Encounter (Signed)
Pt calling with questions regarding covid testing and quarantine process due to exposure. Answered to pt's satisfaction. Advised to [facility] for any additional questions.

## 2019-11-11 ENCOUNTER — Ambulatory Visit: Payer: 59 | Attending: Internal Medicine

## 2019-11-11 DIAGNOSIS — Z20822 Contact with and (suspected) exposure to covid-19: Secondary | ICD-10-CM

## 2019-11-13 ENCOUNTER — Other Ambulatory Visit (HOSPITAL_COMMUNITY): Payer: Self-pay | Admitting: Psychiatry

## 2019-11-13 DIAGNOSIS — F3181 Bipolar II disorder: Secondary | ICD-10-CM

## 2019-11-13 LAB — NOVEL CORONAVIRUS, NAA: SARS-CoV-2, NAA: NOT DETECTED

## 2019-12-09 ENCOUNTER — Other Ambulatory Visit: Payer: Self-pay

## 2019-12-09 ENCOUNTER — Encounter: Payer: Self-pay | Admitting: Cardiology

## 2019-12-09 ENCOUNTER — Ambulatory Visit (INDEPENDENT_AMBULATORY_CARE_PROVIDER_SITE_OTHER): Payer: 59 | Admitting: Cardiology

## 2019-12-09 VITALS — BP 131/77 | HR 66 | Temp 97.7°F | Ht 65.0 in | Wt 143.6 lb

## 2019-12-09 DIAGNOSIS — E7800 Pure hypercholesterolemia, unspecified: Secondary | ICD-10-CM | POA: Diagnosis not present

## 2019-12-09 DIAGNOSIS — Z7189 Other specified counseling: Secondary | ICD-10-CM

## 2019-12-09 DIAGNOSIS — Z8249 Family history of ischemic heart disease and other diseases of the circulatory system: Secondary | ICD-10-CM | POA: Diagnosis not present

## 2019-12-09 NOTE — Patient Instructions (Signed)
Medication Instructions:  Your Physician recommend you continue on your current medication as directed.    *If you need a refill on your cardiac medications before your next appointment, please call your pharmacy*  Lab Work: None  Testing/Procedures: Calcium Score 1126 [place]: At [facility], you and your health needs are our priority.  As part of our continuing mission to provide you with exceptional heart care, we have created designated Provider Care Teams.  These Care Teams include your primary Cardiologist (physician) and Advanced Practice Providers (APPs -  Physician Assistants and Nurse Practitioners) who all work together to provide you with the care you need, when you need it.  Your next appointment:   Based of test results  The format for your next appointment:   Either In Person or Virtual  Provider:   Buford Dresser, MD

## 2019-12-09 NOTE — Progress Notes (Signed)
Cardiology Office Note:    Date:  12/09/2019   ID:  Nancy Bradshaw, DOB 10-06-1966, MRN [id number]  PCP:  Forrest Moron, MD  Cardiologist:  Buford Dresser, MD  Referring MD: Forrest Moron, MD   CC: new patient consult for the evaluation and management of hypercholesterolemia  History of Present Illness:    Nancy Bradshaw is a 54 y.o. female with a hx of hypertension, hypercholesterolemia, bipolar disorder, PTSD who is seen as a new consult at the request of Forrest Moron, MD for the evaluation and management of hypercholesterolemia.  Per most recent note from 11/11/19 visit with Dr. Nolon Rod, patient has a history of hypercholesterolemia and has declined statin. Referral notes ask for advanced lipid clinic, though she has not been seen by general cardiology in the past.  Today: When she started geodon, lipids spiked. Also gained weight with this, was 30 lbs heavier at her peak. Had poor diet, wasn't active. Now is very active, eats very healthy. Leans vegetarian. Feels strongly that her numbers and cholesterol ratio are fine. We reviewed her lipids, ASCVD risk score, and risk factors.  Cardiovascular risk factors: Prior clinical ASCVD: none Comorbid conditions: denies kidney disease, diabetes. Endorses hypertension, hypercholesterolemia Metabolic syndrome/Obesity: BMI 23, within normal range Chronic inflammatory conditions: none Tobacco use history: former, none in >5 years. Family history: all of her family has a heart murmur. Father is 73, diagnosed with high cholesterol in his 32s, has been on a statin for a long time. No MI. Has HTN. Paternal Grandmother had very high cholesterol, angina, placed on a statin-type medication in the late 1970s-early 1980s. Died of a stroke, had multiple mini strokes. Sister doesn't have any known issues. 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 Asymptomatic Current diet: minimal animal proteins, some cheese/dairy but overall Mediterranean based. Does best on high protein/low carb diet.  She has never tried a statin and is not interested in trying. Father had myalgias and brain fog on a statin, she cannot afford to have this. 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 She endorses high level of medical literacy and states that she has done her research on the topic.  History of available lipids: Peak Tchol 332 10/24/2018. Peak LDL 236 10/18/2017.   Denies chest pain, shortness of breath at rest or with normal exertion. No PND, orthopnea, LE edema or unexpected weight gain. No syncope or palpitations.  Past Medical History:  Diagnosis Date  . Alcohol abuse   . Allergy   . Anxiety Dx 2006  . Bipolar 2 disorder [facility]) Dx 2008   [facility] 2011-2017  . Heart murmur    Dx at age of 35  . Hx of dysmenorrhea 07/20/2012   Seasonale prescribed for severe dysmenorrhea and heavy bleeding with clotting; on OCP, menses lasts 4 days with light bleeding.   Marland Kitchen Hyperlipidemia    Dx at age of 89  . Hypertension Dx 2009  . Post-operative nausea and vomiting   . PTSD (post-traumatic stress disorder) Dx 2008  . Sleep apnea    mouth piece  . Substance abuse ([place])    No alcohol since 2008    Past Surgical History:  Procedure Laterality Date  . APPENDECTOMY  2012  . ENDOMETRIAL ABLATION  02/2014   . TUBAL LIGATION  02/2014     Current Medications: Current  Outpatient Medications on File Prior to Visit  Medication Sig  . lamoTRIgine (LAMICTAL) 200 MG tablet Take 1.5 tablets (300 mg total) by mouth daily.  Marland Kitchen lisinopril (ZESTRIL) 2.5 MG tablet Take 1 tablet (2.5 mg total) by mouth daily. Do not take if blood pressure is less than 130/80.  Marland Kitchen LYSINE PO Take 1 tablet by mouth daily.  . mirtazapine (REMERON) 45 MG tablet Take 1 tablet (45 mg total) by mouth  at bedtime.  . Multiple Vitamin (MULTIVITAMIN) tablet Take 1 tablet by mouth daily.  . Omega-3 Fatty Acids (OMEGA 3 PO) Take by mouth.  . Red Yeast Rice Extract (RED YEAST RICE PO) Take 1 tablet by mouth daily.  . ziprasidone (GEODON) 80 MG capsule TAKE 1 CAPSULE BY MOUTH EVERY DAY   No current facility-administered medications on file prior to visit.     Allergies:   Azithromycin, Codeine, Other, Sudafed [pseudoephedrine hcl], Zoloft [sertraline hcl], Penicillins, and Sulfa antibiotics   Social History   Tobacco Use  . Smoking status: Former Smoker    Quit date: 11/14/2001    Years since quitting: 18.0  . Smokeless tobacco: Never Used  Substance Use Topics  . Alcohol use: No    Alcohol/week: 0.0 standard drinks    Comment: In Recovery  since 2006  . Drug use: No    Family History: family history includes ADD / ADHD in her daughter; Anxiety disorder in her daughter and father; Bipolar disorder in her paternal grandmother; Cancer (age of onset: 29) in her father; Cancer (age of onset: 45) in her maternal grandmother; Chronic fatigue in her mother; Colon polyps in her father; Depression in her daughter and father; Heart disease in her paternal grandmother; Heart disease (age of onset: 18) in her father; Hyperlipidemia in her father and paternal grandmother; Hypertension in her father, maternal grandfather, maternal grandmother, and paternal grandmother; Hypothyroidism in her maternal grandmother. There is no history of Colon cancer, Esophageal cancer, Rectal cancer, or Stomach cancer.  ROS:   Please see the history of present illness.  Additional pertinent ROS: Constitutional: Negative for chills, fever, night sweats, unintentional weight loss  HENT: Negative for ear pain and hearing loss.   Eyes: Negative for loss of vision and eye pain.  Respiratory: Negative for cough, sputum, wheezing.   Cardiovascular: See HPI. Gastrointestinal: Negative for abdominal pain, melena, and  hematochezia.  Genitourinary: Negative for dysuria and hematuria.  Musculoskeletal: Negative for falls and myalgias.  Skin: Negative for itching and rash.  Neurological: Negative for focal weakness, focal sensory changes and loss of consciousness.  Endo/Heme/Allergies: Does not bruise/bleed easily.     EKGs/Labs/Other Studies Reviewed:    The following studies were reviewed today: No prior cardiac studies  EKG:  EKG is personally reviewed.  The ekg ordered today demonstrates NSR  Recent Labs: 01/10/2019: Hemoglobin 13.0; Platelets 227; TSH 1.150 11/01/2019: ALT 14; BUN 12; Creatinine, Ser 0.93; Potassium 4.0; Sodium 142  Recent Lipid Panel    Component Value Date/Time   CHOL 279 (H) 11/01/2019 0835   TRIG 72 11/01/2019 0835   HDL 86 11/01/2019 0835   CHOLHDL 3.2 11/01/2019 0835   CHOLHDL 5.0 08/10/2016 0858   VLDL 22 08/10/2016 0858   LDLCALC 182 (H) 11/01/2019 0835    Physical Exam:    VS:  BP 131/77   Pulse 66   Temp 97.7 F (36.5 C)   Ht 5\' 5"  (1.651 m)   Wt 143 lb 9.6 oz (65.1 kg)   SpO2 100%  BMI 23.90 kg/m     Wt Readings from Last 3 Encounters:  12/09/19 143 lb 9.6 oz (65.1 kg)  11/01/19 144 lb 3.2 oz (65.4 kg)  08/10/19 139 lb (63 kg)    GEN: Well nourished, well developed in no acute distress HEENT: Normal, moist mucous membranes NECK: No JVD CARDIAC: regular rhythm, normal S1 and S2, no rubs or gallops. No murmurs. VASCULAR: Radial and DP pulses 2+ bilaterally. No carotid bruits RESPIRATORY:  Clear to auscultation without rales, wheezing or rhonchi  ABDOMEN: Soft, non-tender, non-distended MUSCULOSKELETAL:  Ambulates independently SKIN: Warm and dry, no edema NEUROLOGIC:  Alert and oriented x 3. No focal neuro deficits noted. PSYCHIATRIC:  Normal affect    ASSESSMENT:    No diagnosis found. PLAN:    Hypercholesterolemia: peak is very elevated, though recently somewhat improved -has family history of high cholesterol, MI, strokes in her  family -most recent LDL 182, peak 236. -it is likely that prior psychiatric medications worsened her cholesterol profile, she is now normal weight with excellent diet and activity level. Despite this, her LDL is 182. This strongly suggests underlying component of genetic lipid disorder, including possible heterozygous familial hypercholesterolemia.  We discussed pathology of cholesterol, how plaques form, that MI/CVA result commonly from acute plaque rupture and not gradual stenosis. Discussed mechanism of statin to both decrease plaque accumulation and stabilize plaque that is already present. Discussed that calcium is a marker for plaque, with decades of validated data regarding average amounts of calcium for age/gender/ethnicity, as well as value of calcium score for risk stratification.  She is well educated after her own research regarding lipids and risks. After her father's experience, she declines to consider a trial of statin.   She feels that her overall profile is not suggestive of her LDL being high risk. She does have some protection from a very good HDL of 86 and a ratio of 3.2, but the LDL is still at a level that guidelines would recommend treating.  We discussed alternative screening strategies, including additional labs or a calcium score.  She is amenable to a calcium score. We discussed how this score is used:   Calcium Score  Presence of CAD  0 No evidence of CAD   1-10 Minimal evidence of CAD  11-100 Mild evidence of CAD  101-400 Moderate evidence of CAD  Over 400 Extensive evidence of CAD   If her calcium score is elevated, she would consider meeting with our lipid clinic to discuss non-statin choices. I briefly mentioned ezetimibe today. She wishes to get the calcium score first.  Cardiac risk counseling and prevention recommendations: She has excellent diet and lifestyle habits. We reviewed the AHA guidelines, below: -recommend heart healthy/Mediterranean diet, with  whole grains, fruits, vegetable, fish, lean meats, nuts, and olive oil. Limit salt. -recommend moderate walking, 3-5 times/week for 30-50 minutes each session. Aim for at least 150 minutes.week. Goal should be pace of 3 miles/hours, or walking 1.5 miles in 30 minutes -recommend avoidance of tobacco products. Avoid excess alcohol.  ASCVD risk score: The 10-year ASCVD risk score Mikey Bussing DC Brooke Bonito., et al., 2013) is: 1.7%   Values used to calculate the score:     Age: 55 years     Sex: Female     Is Non-Hispanic African American: No     Diabetic: No     Tobacco smoker: No     Systolic Blood Pressure: [id number] mmHg     Is BP treated: Yes  HDL Cholesterol: 86 mg/dL     Total Cholesterol: 279 mg/dL    Plan for follow up: TBD based on results of calcium score  Total time of encounter: 45 minutes total time of encounter, including 37 minutes spent in face-to-face patient care. This time includes coordination of care and counseling regarding cholesterol, risk, management, guidelines. Remainder of non-face-to-face time involved reviewing chart documents/testing relevant to the patient encounter and documentation in the medical record.  Buford Dresser, MD, PhD [place]  [facility]    Medication Adjustments/Labs and Tests Ordered: Current medicines are reviewed at length with the patient today.  Concerns regarding medicines are outlined above.  Orders Placed This Encounter  Procedures  . CT CARDIAC SCORING  . EKG 12-Lead   No orders of the defined types were placed in this encounter.   Patient Instructions  Medication Instructions:  Your Physician recommend you continue on your current medication as directed.    *If you need a refill on your cardiac medications before your next appointment, please call your pharmacy*  Lab Work: None  Testing/Procedures: Calcium Score 112[address] [place]: At [facility], you and your health needs are our priority.  As part  of our continuing mission to provide you with exceptional heart care, we have created designated Provider Care Teams.  These Care Teams include your primary Cardiologist (physician) and Advanced Practice Providers (APPs -  Physician Assistants and Nurse Practitioners) who all work together to provide you with the care you need, when you need it.  Your next appointment:   Based of test results  The format for your next appointment:   Either In Person or Virtual  Provider:   Buford Dresser, MD      Signed, Buford Dresser, MD PhD 12/09/2019    [place]

## 2019-12-11 ENCOUNTER — Ambulatory Visit: Payer: 59 | Attending: Internal Medicine

## 2019-12-11 DIAGNOSIS — Z20822 Contact with and (suspected) exposure to covid-19: Secondary | ICD-10-CM

## 2019-12-13 LAB — NOVEL CORONAVIRUS, NAA: SARS-CoV-2, NAA: NOT DETECTED

## 2019-12-15 ENCOUNTER — Encounter: Payer: Self-pay | Admitting: Cardiology

## 2019-12-17 ENCOUNTER — Encounter: Payer: Self-pay | Admitting: Family Medicine

## 2020-01-10 ENCOUNTER — Ambulatory Visit (INDEPENDENT_AMBULATORY_CARE_PROVIDER_SITE_OTHER)
Admission: RE | Admit: 2020-01-10 | Discharge: 2020-01-10 | Disposition: A | Discharge status: Home / Self Care | Payer: Self-pay | Source: Ambulatory Visit | Attending: Cardiology | Admitting: Cardiology

## 2020-01-10 ENCOUNTER — Other Ambulatory Visit: Payer: Self-pay

## 2020-01-10 DIAGNOSIS — E7800 Pure hypercholesterolemia, unspecified: Secondary | ICD-10-CM

## 2020-01-14 ENCOUNTER — Encounter: Payer: Self-pay | Admitting: Family Medicine

## 2020-01-14 NOTE — Telephone Encounter (Signed)
Received this via MyChart for you from Ms Pande. Please advise.  Hello Dr. Judeen Hammans have just received the results of my calcium score. My score was zero indicating that I have no scaring on my arteries and am not at risk for a heart attack as my cholesterol would suggest. So, this means that there is no reason for a statin. Also, I did receive your message after my last visit about my cholesterol being high and you indicated that my cholesterol had increased between 2019 and 2020 and that is actually not accurate. In fact, it has gone down. I discussed this with Dr. Harrell Gave at the lipid clinic. I am happy to discuss this further if you would like. I am glad to know that the intuition that I have about my own body is true--my heart is in very good shape and the hard work I am doing to take care of it is paying off. Kind regards, Nancy Bradshaw

## 2020-02-02 ENCOUNTER — Other Ambulatory Visit: Payer: Self-pay | Admitting: Family Medicine

## 2020-02-02 NOTE — Telephone Encounter (Signed)
Approved per protocol.  

## 2020-03-04 ENCOUNTER — Encounter (HOSPITAL_COMMUNITY): Payer: Self-pay

## 2020-03-04 ENCOUNTER — Other Ambulatory Visit: Payer: Self-pay

## 2020-03-04 ENCOUNTER — Ambulatory Visit (HOSPITAL_COMMUNITY)
Admission: EM | Admit: 2020-03-04 | Discharge: 2020-03-04 | Disposition: A | Discharge status: Home / Self Care | Payer: BC Managed Care – PPO | Attending: Emergency Medicine | Admitting: Emergency Medicine

## 2020-03-04 DIAGNOSIS — Z885 Allergy status to narcotic agent status: Secondary | ICD-10-CM | POA: Diagnosis not present

## 2020-03-04 DIAGNOSIS — Z880 Allergy status to penicillin: Secondary | ICD-10-CM | POA: Insufficient documentation

## 2020-03-04 DIAGNOSIS — W57XXXA Bitten or stung by nonvenomous insect and other nonvenomous arthropods, initial encounter: Secondary | ICD-10-CM

## 2020-03-04 DIAGNOSIS — R21 Rash and other nonspecific skin eruption: Secondary | ICD-10-CM | POA: Diagnosis not present

## 2020-03-04 DIAGNOSIS — Z79899 Other long term (current) drug therapy: Secondary | ICD-10-CM | POA: Insufficient documentation

## 2020-03-04 DIAGNOSIS — Z881 Allergy status to other antibiotic agents status: Secondary | ICD-10-CM | POA: Insufficient documentation

## 2020-03-04 DIAGNOSIS — L03113 Cellulitis of right upper limb: Secondary | ICD-10-CM | POA: Diagnosis not present

## 2020-03-04 DIAGNOSIS — Z8349 Family history of other endocrine, nutritional and metabolic diseases: Secondary | ICD-10-CM | POA: Diagnosis not present

## 2020-03-04 DIAGNOSIS — Z888 Allergy status to other drugs, medicaments and biological substances status: Secondary | ICD-10-CM | POA: Diagnosis not present

## 2020-03-04 DIAGNOSIS — Z87891 Personal history of nicotine dependence: Secondary | ICD-10-CM | POA: Insufficient documentation

## 2020-03-04 DIAGNOSIS — Z8249 Family history of ischemic heart disease and other diseases of the circulatory system: Secondary | ICD-10-CM | POA: Insufficient documentation

## 2020-03-04 DIAGNOSIS — Z882 Allergy status to sulfonamides status: Secondary | ICD-10-CM | POA: Diagnosis not present

## 2020-03-04 MED ORDER — DOXYCYCLINE HYCLATE 100 MG PO CAPS: 100.0000 mg | ORAL_CAPSULE | Freq: Two times a day (BID) | ORAL | 0 refills | Status: DC

## 2020-03-04 NOTE — ED Triage Notes (Signed)
Pt presents to UC with 3 rick bites x 3 days. Pt reports she removed the ticks 1 in each leg and 1 in her right wrist. Pt states the rash is getting bitter is hot to touch.

## 2020-03-04 NOTE — Discharge Instructions (Addendum)
You will need to follow up with your pcp for reevaluation of the cellulitis to rt extremity  Monitor the area if it continues to increase in redness you will need to be seen in the ER for further iv antibiotic treatment.  We will send off you lab results and you can review these results in my chart  May use benadryl to help with itching take as needed  You currently do not have a fever but can use motrin or tylenol as needed

## 2020-03-04 NOTE — ED Provider Notes (Signed)
[place]    CSN: [id number] Arrival date & time: 03/04/20  1720      History   Chief Complaint Chief Complaint  Patient presents with  . Insect Bite    HPI Nancy Bradshaw is a 54 y.o. female.   Pt is here due to sat was at her fathers house and noticed 3 ticks on her yesterday. One to rt lower fa near wrist, one to rt calf area, and another to lt lower leg. RT wrist has redness around the bite and causing increased redness up the forearm. Itching. Denies any fever, no n/v/d, . Pt use to work for infectious disease and is asking for [place]ym[place] and [place] testing today. Has not taken anything pta      Past Medical History:  Diagnosis Date  . Alcohol abuse   . Allergy   . Anxiety Dx 2006  . Bipolar 2 disorder [facility]) Dx 2008   [facility] 2011-2017  . Heart murmur    Dx at age of 28  . Hx of dysmenorrhea 07/20/2012   Seasonale prescribed for severe dysmenorrhea and heavy bleeding with clotting; on OCP, menses lasts 4 days with light bleeding.   Marland Kitchen Hyperlipidemia    Dx at age of 15  . Hypertension Dx 2009  . Post-operative nausea and vomiting   . PTSD (post-traumatic stress disorder) Dx 2008  . Sleep apnea    mouth piece  . Substance abuse ([place])    No alcohol since 2008    Patient Active Problem List   Diagnosis Date Noted  . Dizziness 01/10/2019  . Esophageal reflux 10/28/2015  . Obesity (BMI 30.0-34.9) 10/28/2015  . PTSD (post-traumatic stress disorder) 10/15/2014  . Hyperlipidemia 10/15/2014  . Family history of hypothyroidism 10/15/2014  . Bipolar disorder ([place]) 06/21/2013  . Personal history of alcoholism ([place]) 07/20/2012  . HTN (hypertension) 07/20/2012    Past Surgical History:  Procedure Laterality Date  . APPENDECTOMY  2012  . ENDOMETRIAL ABLATION  02/2014   . TUBAL LIGATION  02/2014     OB History    Gravida  1   Para      Term      Preterm      AB      Living  1     SAB      TAB      Ectopic      Multiple      Live Births           Obstetric Comments  Pre-eclampsia          Home Medications    Prior to Admission medications   Medication Sig Start Date End Date Taking? Authorizing Provider  doxycycline (VIBRAMYCIN) 100 MG capsule Take 1 capsule (100 mg total) by mouth 2 (two) times daily. 03/04/20   Marney Setting, NP  lamoTRIgine (LAMICTAL) 200 MG tablet Take 1.5 tablets (300 mg total) by mouth daily. 09/05/19   Charlcie Cradle, MD  lisinopril (ZESTRIL) 2.5 MG tablet TAKE 1 TABLET (2.5 MG TOTAL) BY MOUTH DAILY. DO NOT TAKE IF BLOOD PRESSURE IS LESS THAN 130/80. 02/02/20   Delia Chimes A, MD  LYSINE PO Take 1 tablet by mouth daily.    [provider]  mirtazapine (REMERON) 45 MG tablet Take 1 tablet (45 mg total) by mouth at bedtime. 09/05/19   Charlcie Cradle, MD  Multiple Vitamin (MULTIVITAMIN) tablet Take 1 tablet by mouth daily.    [provider]  Omega-3 Fatty Acids (  OMEGA 3 PO) Take by mouth.    [provider]  Red Yeast Rice Extract (RED YEAST RICE PO) Take 1 tablet by mouth daily.    [provider]  ziprasidone (GEODON) 80 MG capsule TAKE 1 CAPSULE BY MOUTH EVERY DAY 11/21/19   Charlcie Cradle, MD    Family History Family History  Problem Relation Age of Onset  . Hypertension Father   . Hyperlipidemia Father   . Cancer Father 44       CLL  . Anxiety disorder Father   . Depression Father   . Heart disease Father 87       CHF  . Colon polyps Father   . Hypertension Maternal Grandmother   . Hypothyroidism Maternal Grandmother        and great aunts   . Cancer Maternal Grandmother 74       on spinal cord, rare   . Hypertension Maternal Grandfather   . Bipolar disorder Paternal Grandmother   . Hypertension Paternal Grandmother   . Hyperlipidemia Paternal Grandmother   . Heart disease Paternal Grandmother   . Chronic fatigue Mother   . ADD / ADHD Daughter   . Anxiety disorder Daughter   . Depression Daughter   .  Colon cancer Neg Hx   . Esophageal cancer Neg Hx   . Rectal cancer Neg Hx   . Stomach cancer Neg Hx     Social History Social History   Tobacco Use  . Smoking status: Former Smoker    Quit date: 11/14/2001    Years since quitting: 18.3  . Smokeless tobacco: Never Used  Substance Use Topics  . Alcohol use: No    Alcohol/week: 0.0 standard drinks    Comment: In Recovery  since 2006  . Drug use: No     Allergies   Azithromycin, Codeine, Other, Sudafed [pseudoephedrine hcl], Zoloft [sertraline hcl], Penicillins, and Sulfa antibiotics   Review of Systems Review of Systems  Constitutional: Negative.   Eyes: Negative.   Respiratory: Negative.   Cardiovascular: Negative.   Gastrointestinal: Negative.   Musculoskeletal: Negative.   Skin: Positive for rash.       Rt wrist area redness, bit to rt lower leg and left lower calf area.   Neurological: Negative.      Physical Exam Triage Vital Signs ED Triage Vitals  Enc Vitals Group     BP 03/04/20 1747 127/75     Pulse Rate 03/04/20 1747 65     Resp 03/04/20 1747 16     Temp 03/04/20 1747 98.2 F (36.8 C)     Temp Source 03/04/20 1747 Oral     SpO2 03/04/20 1747 100 %     Weight --      Height --      Head Circumference --      Peak Flow --      Pain Score 03/04/20 1746 0     Pain Loc --      Pain Edu? --      Excl. in [place]? --    No data found.  Updated Vital Signs BP 127/75 (BP Location: Right Arm)   Pulse 65   Temp 98.2 F (36.8 C) (Oral)   Resp 16   SpO2 100%   Visual Acuity     Physical Exam Constitutional:      Appearance: Normal appearance.  Cardiovascular:     Rate and Rhythm: Normal rate.     Pulses: Normal pulses.  Pulmonary:  Effort: Pulmonary effort is normal.  Abdominal:     General: Abdomen is flat.  Musculoskeletal:        General: Normal range of motion.  Skin:    Findings: Erythema and rash present.     Comments: Rt wrist a small insect bite area with approx 3cm round area  erythema noted raised area.strong pulses,  Warm to touch. RT/lt  lower calf area insect bite appearence no signs of cellulitis.    Neurological:     General: No focal deficit present.     Mental Status: She is alert.      UC Treatments / Results  Labs (all labs ordered are listed, but only abnormal results are displayed) [place] MTN SPOTTED FVR ABS PNL(IGG+IGM)    EKG   Radiology No results found.  Procedures Procedures (including critical care time)  Medications Ordered in UC Medications - No data to display  Initial Impression / Assessment and Plan / UC Course  I have reviewed the triage vital signs and the nursing notes.  Pertinent labs & imaging results that were available during my care of the patient were reviewed by me and considered in my medical decision making (see chart for details).     You will need to follow up with your pcp for reevaluation of the cellulitis to rt extremity  Monitor the area if it continues to increase in redness you will need to be seen in the ER for further iv antibiotic treatment.  We will send off you lab results and you can review these results in my chart  May use benadryl to help with itching take as needed  You currently do not have a fever but can use motrin or tylenol as needed  Final Clinical Impressions(s) / UC Diagnoses   Final diagnoses:  Cellulitis of right upper extremity  Tick bite, initial encounter  Rash     Discharge Instructions     You will need to follow up with your pcp for reevaluation of the cellulitis to rt extremity  Monitor the area if it continues to increase in redness you will need to be seen in the ER for further iv antibiotic treatment.  We will send off you lab results and you can review these results in my chart  May use benadryl to help with itching take as needed  You currently do not have a fever but can use motrin or tylenol as needed       ED  Prescriptions    Medication Sig Dispense Auth. Provider   doxycycline (VIBRAMYCIN) 100 MG capsule Take 1 capsule (100 mg total) by mouth 2 (two) times daily. 20 capsule Marney Setting, NP     PDMP not reviewed this encounter.   Marney Setting, NP 03/04/20 [phone]

## 2020-03-05 LAB — B. BURGDORFI ANTIBODIES: B burgdorferi Ab IgG+IgM: <0.91 {ISR} (ref 0.00–0.90)

## 2020-03-06 ENCOUNTER — Encounter: Payer: Self-pay | Admitting: Registered Nurse

## 2020-03-06 ENCOUNTER — Ambulatory Visit: Payer: BC Managed Care – PPO | Admitting: Registered Nurse

## 2020-03-06 ENCOUNTER — Other Ambulatory Visit: Payer: Self-pay

## 2020-03-06 VITALS — BP 139/91 | HR 71 | Temp 98.2°F | Resp 16 | Ht 65.0 in | Wt 145.6 lb

## 2020-03-06 DIAGNOSIS — L299 Pruritus, unspecified: Secondary | ICD-10-CM

## 2020-03-06 LAB — ROCKY MTN SPOTTED FVR ABS PNL(IGG+IGM)
RMSF IgG: POSITIVE — AB
RMSF IgM: 0.51 index (ref 0.00–0.89)

## 2020-03-06 LAB — RMSF, IGG, IFA: RMSF, IGG, IFA: <1:64 {titer}

## 2020-03-06 MED ORDER — HYDROXYZINE HCL 10 MG PO TABS: 10.0000 mg | ORAL_TABLET | Freq: Three times a day (TID) | ORAL | 0 refills | Status: DC | PRN

## 2020-03-06 MED ORDER — PREDNISONE 5 MG PO TABS: 5.0000 mg | ORAL_TABLET | Freq: Every day | ORAL | 0 refills | Status: DC

## 2020-03-06 NOTE — Patient Instructions (Signed)
° ° ° °  If you have lab work done today you will be contacted with your lab results within the next 2 weeks.  If you have not heard from us then please contact us. The fastest way to get your results is to register for My Chart. ° ° °IF you received an x-ray today, you will receive an invoice from [facility]. Please contact [facility] at [phone] with questions or concerns regarding your invoice.  ° °IF you received labwork today, you will receive an invoice from [facility]. Please contact [facility] at [phone] with questions or concerns regarding your invoice.  ° °Our billing staff will not be able to assist you with questions regarding bills from these companies. ° °You will be contacted with the lab results as soon as they are available. The fastest way to get your results is to activate your My Chart account. Instructions are located on the last page of this paperwork. If you have not heard from us regarding the results in 2 weeks, please contact this office. °  ° ° ° °

## 2020-03-07 NOTE — Progress Notes (Signed)
Acute Office Visit  Subjective:    Patient ID: Nancy Bradshaw, female    DOB: 1966/11/11, 54 y.o.   MRN: [id number]  Chief Complaint  Patient presents with  . Results    patient states she was bite by a tick on sunday was red and itching she went to the urgent care and was prescribed medication it was getting better , but now getting worse. Per patient she has some lab results stated she has lime disease    HPI Patient is in today for tick bite.  Bit on Sunday, noticed ticks on Tuesday. Went to ED when developing a rash, testing positive for RMSF. Started on doxycycline 100mg  PO bid for 10 days. Lyme test negative at that time.  Visit today to discuss lab results, follow up testing, and supportive care for itching and rash.  Past Medical History:  Diagnosis Date  . Alcohol abuse   . Allergy   . Anxiety Dx 2006  . Bipolar 2 disorder [facility]) Dx 2008   [facility] 2011-2017  . Heart murmur    Dx at age of 50  . Hx of dysmenorrhea 07/20/2012   Seasonale prescribed for severe dysmenorrhea and heavy bleeding with clotting; on OCP, menses lasts 4 days with light bleeding.   Marland Kitchen Hyperlipidemia    Dx at age of 35  . Hypertension Dx 2009  . Post-operative nausea and vomiting   . PTSD (post-traumatic stress disorder) Dx 2008  . Sleep apnea    mouth piece  . Substance abuse ([place])    No alcohol since 2008    Past Surgical History:  Procedure Laterality Date  . APPENDECTOMY  2012  . ENDOMETRIAL ABLATION  02/2014   . TUBAL LIGATION  02/2014     Family History  Problem Relation Age of Onset  . Hypertension Father   . Hyperlipidemia Father   . Cancer Father 2       CLL  . Anxiety disorder Father   . Depression Father   . Heart disease Father 60       CHF  . Colon polyps Father   . Hypertension Maternal Grandmother   . Hypothyroidism Maternal Grandmother        and great aunts   . Cancer Maternal Grandmother 74       on spinal cord, rare   . Hypertension  Maternal Grandfather   . Bipolar disorder Paternal Grandmother   . Hypertension Paternal Grandmother   . Hyperlipidemia Paternal Grandmother   . Heart disease Paternal Grandmother   . Chronic fatigue Mother   . ADD / ADHD Daughter   . Anxiety disorder Daughter   . Depression Daughter   . Colon cancer Neg Hx   . Esophageal cancer Neg Hx   . Rectal cancer Neg Hx   . Stomach cancer Neg Hx     Social History   Socioeconomic History  . Marital status: Divorced    Spouse name: Not on file  . Number of children: 1   . Years of education: grad schoo  . Highest education level: Not on file  Occupational History  . Not on file  Tobacco Use  . Smoking status: Former Smoker    Quit date: 11/14/2001    Years since quitting: 18.3  . Smokeless tobacco: Never Used  Substance and Sexual Activity  . Alcohol use: No    Alcohol/week: 0.0 standard drinks    Comment: In Recovery  since 2006  . Drug use: No  .  Sexual activity: Not Currently    Birth control/protection: Surgical  Other Topics Concern  . Not on file  Social History Narrative   Social Hx:   Current living situation: lives in [place] with mother and daughter   Born and Raised in [place] by both parents. 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   Kids: 1 daughter  (15yo)   Legal issues: denies   recovery since 2006.   Alcoholism age [age]; binge drinking; benzo abuse stopped in 2008. Pt goes to AA 3x/week      Drugs: none; previous marijuna use about 5 times in her life time      Tobacco: quit smoking in 2002; smoked x 25 years      Exercise: no formal exercise in 2018.     Social Determinants of Health   Financial Resource Strain:   .  Difficulty of Paying Living Expenses:   Food Insecurity:   . Worried About Charity fundraiser in the Last Year:   . Arboriculturist in the Last Year:   Transportation Needs:   . Film/video editor (Medical):   Marland Kitchen Lack of Transportation (Non-Medical):   Physical Activity:   . Days of Exercise per Week:   . Minutes of Exercise per Session:   Stress:   . Feeling of Stress :   Social Connections:   . Frequency of Communication with Friends and Family:   . Frequency of Social Gatherings with Friends and Family:   . Attends Religious Services:   . Active Member of Clubs or Organizations:   . Attends Archivist Meetings:   Marland Kitchen Marital Status:   Intimate Partner Violence:   . Fear of Current or Ex-Partner:   . Emotionally Abused:   Marland Kitchen Physically Abused:   . Sexually Abused:     Outpatient Medications Prior to Visit  Medication Sig Dispense Refill  . doxycycline (VIBRAMYCIN) 100 MG capsule Take 1 capsule (100 mg total) by mouth 2 (two) times daily. 20 capsule 0  . lamoTRIgine (LAMICTAL) 200 MG tablet Take 1.5 tablets (300 mg total) by mouth daily. 135 tablet 1  . lisinopril (ZESTRIL) 2.5 MG tablet TAKE 1 TABLET (2.5 MG TOTAL) BY MOUTH DAILY. DO NOT TAKE IF BLOOD PRESSURE IS LESS THAN 130/80. 90 tablet 0  . LYSINE PO Take 1 tablet by mouth daily.    . mirtazapine (REMERON) 45 MG tablet Take 1 tablet (45 mg total) by mouth at bedtime. 90 tablet 1  . Multiple Vitamin (MULTIVITAMIN) tablet Take 1 tablet by mouth daily.    . Omega-3 Fatty Acids (OMEGA 3 PO) Take by mouth.    . Red Yeast Rice Extract (RED YEAST RICE PO) Take 1 tablet by mouth daily.    . ziprasidone (GEODON) 80 MG capsule TAKE 1 CAPSULE BY MOUTH EVERY DAY 90 capsule 1   No facility-administered medications prior to visit.    Allergies  Allergen Reactions  . Azithromycin Nausea And Vomiting  . Codeine Nausea And Vomiting  . Other     Pt prefers not to have pain medication if not necessary- recovering  alcoholic.   [place] [Pseudoephedrine Hcl]     "  Creepy crawly skin"--if taken consistently  . Zoloft [Sertraline Hcl]     Due to bipolar disorder-makes her manic.   Marland Kitchen Penicillins Rash    Has patient had a PCN reaction causing immediate rash, facial/tongue/throat swelling, SOB or lightheadedness with hypotension: No Has patient had a PCN reaction causing severe rash involving mucus membranes or skin necrosis: No Has patient had a PCN reaction that required hospitalization: No Has patient had a PCN reaction occurring within the last 10 years: No If all of the above answers are "NO", then may proceed with Cephalosporin use.   . Sulfa Antibiotics Rash    Review of Systems  Constitutional: Negative.   HENT: Negative.   Eyes: Negative.   Respiratory: Negative.   Cardiovascular: Negative.   Gastrointestinal: Negative.   Endocrine: Negative.   Genitourinary: Negative.   Musculoskeletal: Negative.   Skin: Positive for rash. Negative for color change, pallor and wound.  Allergic/Immunologic: Negative.   Neurological: Negative.   Hematological: Negative.   Psychiatric/Behavioral: Negative.   All other systems reviewed and are negative.      Objective:    Physical Exam Vitals and nursing note reviewed.  Constitutional:      General: She is not in acute distress.    Appearance: Normal appearance. She is normal weight. She is not ill-appearing, toxic-appearing or diaphoretic.  Cardiovascular:     Rate and Rhythm: Normal rate and regular rhythm.  Skin:    General: Skin is warm and dry.     Capillary Refill: Capillary refill takes less than 2 seconds.     Coloration: Skin is not jaundiced or pale.     Findings: Rash present. No bruising, erythema or lesion.  Neurological:     General: No focal deficit present.     Mental Status: She is alert and oriented to person, place, and time. Mental status is at baseline.  Psychiatric:        Mood and Affect: Mood normal.        Behavior:  Behavior normal.        Thought Content: Thought content normal.        Judgment: Judgment normal.     BP (!) 139/91   Pulse 71   Temp 98.2 F (36.8 C) (Temporal)   Resp 16   Ht 5\' 5"  (1.651 m)   Wt 145 lb 9.6 oz (66 kg)   SpO2 100%   BMI 24.23 kg/m  Wt Readings from Last 3 Encounters:  03/06/20 145 lb 9.6 oz (66 kg)  12/09/19 143 lb 9.6 oz (65.1 kg)  11/01/19 144 lb 3.2 oz (65.4 kg)    Health Maintenance Due  Topic Date Due  . COVID-19 Vaccine (1) Never done    There are no preventive care reminders to display for this patient.   Lab Results  Component Value Date   TSH 1.150 01/10/2019   Lab Results  Component Value Date   WBC 6.7 01/10/2019   HGB 13.0 01/10/2019   HCT 36.9 01/10/2019   MCV 88 01/10/2019   PLT 227 01/10/2019   Lab Results  Component Value Date   NA 142 11/01/2019   K 4.0 11/01/2019   CO2 23 11/01/2019   GLUCOSE 88 11/01/2019   BUN 12 11/01/2019   CREATININE 0.93 11/01/2019   BILITOT 0.5 11/01/2019   ALKPHOS 79 11/01/2019   AST 19 11/01/2019   ALT 14 11/01/2019   PROT 6.6 11/01/2019   ALBUMIN 4.6 11/01/2019   CALCIUM 9.5 11/01/2019  Lab Results  Component Value Date   CHOL 279 (H) 11/01/2019   Lab Results  Component Value Date   HDL 86 11/01/2019   Lab Results  Component Value Date   LDLCALC 182 (H) 11/01/2019   Lab Results  Component Value Date   TRIG 72 11/01/2019   Lab Results  Component Value Date   CHOLHDL 3.2 11/01/2019   Lab Results  Component Value Date   HGBA1C 5.5 10/24/2018       Assessment & Plan:   Problem List Items Addressed This Visit    None    Visit Diagnoses    Itching    -  Primary   Relevant Medications   hydrOXYzine (ATARAX/VISTARIL) 10 MG tablet   predniSONE (DELTASONE) 5 MG tablet       Meds ordered this encounter  Medications  . hydrOXYzine (ATARAX/VISTARIL) 10 MG tablet    Sig: Take 1 tablet (10 mg total) by mouth 3 (three) times daily as needed.    Dispense:  30 tablet     Refill:  0    Order Specific Question:   Supervising Provider    Answer:   Delia Chimes A [id number]  . predniSONE (DELTASONE) 5 MG tablet    Sig: Take 1 tablet (5 mg total) by mouth daily with breakfast.    Dispense:  14 tablet    Refill:  0    Order Specific Question:   Supervising Provider    Answer:   Forrest Moron [id number]   PLAN  With respect to incubation period, will re test for lyme in 3-4 weeks  Discussed efficacy of doxycycline as tx for RMSF  Discussed symptoms of tick borne illnesses  Patient encouraged to call clinic with any questions, comments, or concerns.   Maximiano Coss, NP

## 2020-03-12 ENCOUNTER — Other Ambulatory Visit: Payer: Self-pay

## 2020-03-12 ENCOUNTER — Telehealth (INDEPENDENT_AMBULATORY_CARE_PROVIDER_SITE_OTHER): Payer: BC Managed Care – PPO | Admitting: Psychiatry

## 2020-03-12 ENCOUNTER — Encounter (HOSPITAL_COMMUNITY): Payer: Self-pay | Admitting: Psychiatry

## 2020-03-12 DIAGNOSIS — F3181 Bipolar II disorder: Secondary | ICD-10-CM | POA: Diagnosis not present

## 2020-03-12 DIAGNOSIS — F411 Generalized anxiety disorder: Secondary | ICD-10-CM

## 2020-03-12 DIAGNOSIS — Z8659 Personal history of other mental and behavioral disorders: Secondary | ICD-10-CM

## 2020-03-12 DIAGNOSIS — F4310 Post-traumatic stress disorder, unspecified: Secondary | ICD-10-CM

## 2020-03-12 MED ORDER — MIRTAZAPINE 45 MG PO TABS: 45.0000 mg | ORAL_TABLET | Freq: Every day | ORAL | 1 refills | Status: DC

## 2020-03-12 MED ORDER — ZIPRASIDONE HCL 80 MG PO CAPS: ORAL_CAPSULE | ORAL | 1 refills | Status: DC

## 2020-03-12 MED ORDER — LAMOTRIGINE 200 MG PO TABS: 300.0000 mg | ORAL_TABLET | Freq: Every day | ORAL | 1 refills | Status: DC

## 2020-03-12 NOTE — Progress Notes (Signed)
Virtual Visit via Telephone Note  I connected with Nancy Bradshaw on 03/12/20 at  8:30 AM EDT by telephone and verified that I am speaking with the correct person using two identifiers.  Location: Patient: work Provider: office   I discussed the limitations, risks, security and privacy concerns of performing an evaluation and management service by telephone and the availability of in person appointments. I also discussed with the patient that there may be a patient responsible charge related to this service. The patient expressed understanding and agreed to proceed.   History of Present Illness: Nancy Bradshaw's father died in 41Apr 02, 2024 and she is grieving.They were close and it was a shock but she feels that it is normal. She has good and bad days. She is utilitizing social supports and denies isolation. Even on bad days the sadness l for a few hours.  Her depression is stable. She is still active and working. She denies SI/HI. She denies PTSD. She denies manic or hypomanic like symptoms. [name] does not want meds changed today. She denies relapse and is still active with AA.     Observations/Objective:  General Appearance: unable to assess  Eye Contact:  unable to assess  Speech:  Clear and Coherent and Normal Rate  Volume:  Normal  Mood:  Euthymic  Affect:  Full Range  Thought Process:  Goal Directed, Linear and Descriptions of Associations: Intact  Orientation:  Full (Time, Place, and Person)  Thought Content:  Logical  Suicidal Thoughts:  No  Homicidal Thoughts:  No  Memory:  Immediate;   Good  Judgement:  Good  Insight:  Good  Psychomotor Activity: unable to assess  Concentration:  Concentration: Good  Recall:  Good  Fund of Knowledge:  Good  Language:  Good  Akathisia:  unable to assess  Handed:  Right  AIMS (if indicated):     Assets:  Communication Skills Desire for Improvement Financial Resources/Insurance Housing Resilience Social  Support Talents/Skills Transportation Vocational/Educational  ADL's:  unable to assess  Cognition:  WNL  Sleep:         Assessment and Plan: Bipolar 2; GAD; PTSD; Alcohol use disorder in remission  Geodon 80mg  po qD Remeron 45mg  po qD Lamictal 300mg  po qD   Follow Up Instructions: In 6 mo or sooner if needed   I discussed the assessment and treatment plan with the patient. The patient was provided an opportunity to ask questions and all were answered. The patient agreed with the plan and demonstrated an understanding of the instructions.   The patient was advised to call back or seek an in-person evaluation if the symptoms worsen or if the condition fails to improve as anticipated.  I provided 15 minutes of non-face-to-face time during this encounter.   Charlcie Cradle, MD

## 2020-03-18 ENCOUNTER — Ambulatory Visit: Payer: 59 | Admitting: Family Medicine

## 2020-03-23 ENCOUNTER — Encounter: Payer: Self-pay | Admitting: Registered Nurse

## 2020-03-24 ENCOUNTER — Telehealth: Payer: Self-pay

## 2020-03-24 NOTE — Telephone Encounter (Signed)
Closing encounter, no action needed 

## 2020-03-25 ENCOUNTER — Ambulatory Visit: Payer: 59

## 2020-03-26 ENCOUNTER — Ambulatory Visit: Payer: 59

## 2020-03-27 ENCOUNTER — Ambulatory Visit (INDEPENDENT_AMBULATORY_CARE_PROVIDER_SITE_OTHER): Payer: BC Managed Care – PPO | Admitting: Registered Nurse

## 2020-03-27 ENCOUNTER — Other Ambulatory Visit: Payer: Self-pay | Admitting: Registered Nurse

## 2020-03-27 ENCOUNTER — Other Ambulatory Visit: Payer: Self-pay

## 2020-03-27 DIAGNOSIS — T781XXA Other adverse food reactions, not elsewhere classified, initial encounter: Secondary | ICD-10-CM

## 2020-03-27 NOTE — Progress Notes (Signed)
Lab only visit 

## 2020-04-01 ENCOUNTER — Telehealth: Payer: Self-pay | Admitting: *Deleted

## 2020-04-01 LAB — ALPHA-GAL PANEL
Alpha Gal IgE*: 78 kU/L — ABNORMAL HIGH (ref <0.10)
Beef (Bos spp) IgE: 24.3 kU/L — ABNORMAL HIGH (ref <0.35)
Class Interpretation: 3
Class Interpretation: 3
Class Interpretation: 4
Lamb/Mutton (Ovis spp) IgE: 5.03 kU/L — ABNORMAL HIGH (ref <0.35)
Pork (Sus spp) IgE: 11 kU/L — ABNORMAL HIGH (ref <0.35)

## 2020-04-01 NOTE — Telephone Encounter (Signed)
I returned patient's call to let her know I spoke to Kathrin Ruddy NP about lab results. He stated the results are not back and it take about 5 days or longer to get them. I advised her he will contact her when he get the results. The patient asked were the labs actually sent and this practice has a problem with their labs. She wanted to make sure they were sent, I assured her with yes.

## 2020-04-08 ENCOUNTER — Encounter: Payer: Self-pay | Admitting: Registered Nurse

## 2020-04-08 DIAGNOSIS — Z91018 Allergy to other foods: Secondary | ICD-10-CM

## 2020-04-15 ENCOUNTER — Telehealth: Payer: Self-pay | Admitting: Family Medicine

## 2020-04-15 ENCOUNTER — Encounter: Payer: Self-pay | Admitting: Registered Nurse

## 2020-04-15 NOTE — Telephone Encounter (Signed)
Please Advise on this as I did not know who to send to. Pt is waiting on lime disease results.

## 2020-04-15 NOTE — Telephone Encounter (Signed)
Patient is still waiting for her Lime Disease results . It is not showing in [facility] [place] .   Please call patient with status of this test

## 2020-04-16 NOTE — Telephone Encounter (Signed)
Request about this has been sent to Maximiano Coss via [place] waiting for response

## 2020-04-16 NOTE — Telephone Encounter (Signed)
I do not see the lyme she is talking about hoping you know more I only see negative results from Dr Mannie Stabile on 03/06/2020 and then an Alpa-Gal Panel 04/01/2020 that you had ordered. What should I tell her

## 2020-04-16 NOTE — Telephone Encounter (Signed)
Pt. Called asserting Mr. Maximiano Coss had ordered two tests, both a lime disease test and an alpha-gal test. Pt. Was has received results from the alpha-gal test but has not for the alleged lime disease test.   Can NP Orland Mustard confirm if he did order the test and if so, is there a way we can track down the lime disease test or get the pt. In for the lime disease test?

## 2020-04-20 ENCOUNTER — Encounter: Payer: Self-pay | Admitting: Registered Nurse

## 2020-04-20 NOTE — Telephone Encounter (Signed)
Please Advise

## 2020-04-24 ENCOUNTER — Ambulatory Visit: Payer: 59 | Admitting: Family Medicine

## 2020-04-26 ENCOUNTER — Other Ambulatory Visit: Payer: Self-pay | Admitting: Registered Nurse

## 2020-04-26 DIAGNOSIS — W57XXXD Bitten or stung by nonvenomous insect and other nonvenomous arthropods, subsequent encounter: Secondary | ICD-10-CM

## 2020-05-21 DIAGNOSIS — E785 Hyperlipidemia, unspecified: Secondary | ICD-10-CM | POA: Diagnosis not present

## 2020-05-22 DIAGNOSIS — E119 Type 2 diabetes mellitus without complications: Secondary | ICD-10-CM | POA: Diagnosis not present

## 2020-06-08 ENCOUNTER — Other Ambulatory Visit (HOSPITAL_COMMUNITY): Payer: Self-pay | Admitting: *Deleted

## 2020-06-08 DIAGNOSIS — F411 Generalized anxiety disorder: Secondary | ICD-10-CM

## 2020-06-08 DIAGNOSIS — F4310 Post-traumatic stress disorder, unspecified: Secondary | ICD-10-CM

## 2020-06-08 DIAGNOSIS — F3181 Bipolar II disorder: Secondary | ICD-10-CM

## 2020-06-08 MED ORDER — MIRTAZAPINE 45 MG PO TABS: 45.0000 mg | ORAL_TABLET | Freq: Every day | ORAL | 1 refills | Status: DC

## 2020-07-05 DIAGNOSIS — Z20822 Contact with and (suspected) exposure to covid-19: Secondary | ICD-10-CM | POA: Diagnosis not present

## 2020-07-10 DIAGNOSIS — N920 Excessive and frequent menstruation with regular cycle: Secondary | ICD-10-CM | POA: Diagnosis not present

## 2020-07-10 DIAGNOSIS — F418 Other specified anxiety disorders: Secondary | ICD-10-CM | POA: Diagnosis not present

## 2020-07-14 DIAGNOSIS — Z20822 Contact with and (suspected) exposure to covid-19: Secondary | ICD-10-CM | POA: Diagnosis not present

## 2020-07-26 DIAGNOSIS — Z20822 Contact with and (suspected) exposure to covid-19: Secondary | ICD-10-CM | POA: Diagnosis not present

## 2020-08-02 DIAGNOSIS — Z20822 Contact with and (suspected) exposure to covid-19: Secondary | ICD-10-CM | POA: Diagnosis not present

## 2020-08-09 DIAGNOSIS — Z20822 Contact with and (suspected) exposure to covid-19: Secondary | ICD-10-CM | POA: Diagnosis not present

## 2020-08-10 ENCOUNTER — Other Ambulatory Visit: Payer: Self-pay | Admitting: Family Medicine

## 2020-08-10 DIAGNOSIS — Z1231 Encounter for screening mammogram for malignant neoplasm of breast: Secondary | ICD-10-CM

## 2020-08-23 DIAGNOSIS — Z20822 Contact with and (suspected) exposure to covid-19: Secondary | ICD-10-CM | POA: Diagnosis not present

## 2020-08-30 DIAGNOSIS — Z20822 Contact with and (suspected) exposure to covid-19: Secondary | ICD-10-CM | POA: Diagnosis not present

## 2020-09-03 ENCOUNTER — Other Ambulatory Visit: Payer: Self-pay

## 2020-09-03 ENCOUNTER — Telehealth (INDEPENDENT_AMBULATORY_CARE_PROVIDER_SITE_OTHER): Payer: BC Managed Care – PPO | Admitting: Psychiatry

## 2020-09-03 DIAGNOSIS — F3181 Bipolar II disorder: Secondary | ICD-10-CM

## 2020-09-03 DIAGNOSIS — F4310 Post-traumatic stress disorder, unspecified: Secondary | ICD-10-CM | POA: Diagnosis not present

## 2020-09-03 DIAGNOSIS — F411 Generalized anxiety disorder: Secondary | ICD-10-CM | POA: Diagnosis not present

## 2020-09-03 MED ORDER — MIRTAZAPINE 45 MG PO TABS: 45.0000 mg | ORAL_TABLET | Freq: Every day | ORAL | 1 refills | Status: DC

## 2020-09-03 MED ORDER — ZIPRASIDONE HCL 80 MG PO CAPS: ORAL_CAPSULE | ORAL | 1 refills | Status: DC

## 2020-09-03 MED ORDER — LAMOTRIGINE 200 MG PO TABS: 300.0000 mg | ORAL_TABLET | Freq: Every day | ORAL | 1 refills | Status: DC

## 2020-09-03 NOTE — Progress Notes (Signed)
Virtual Visit via Telephone Note  I connected with Nancy Bradshaw on 09/03/20 at  8:00 AM EDT by telephone and verified that I am speaking with the correct person using two identifiers.  Location: Patient: home Provider: office   I discussed the limitations, risks, security and privacy concerns of performing an evaluation and management service by telephone and the availability of in person appointments. I also discussed with the patient that there may be a patient responsible charge related to this service. The patient expressed understanding and agreed to proceed.   History of Present Illness: Nancy Bradshaw reports she is dealing the death of father well. He passed in March and she misses him. Her sadness is not overwhelming. She is still doing all the things she needs to do. Nancy Bradshaw is going to work, socialize with friends and family, going hiking. Her home life is good. Some random days she will miss her dad greatly she will feel depressed for a few minutes. Maybe once a month she feel had a bad day where she is mildly depressed all day. Nancy Bradshaw denies feeling depressed most days. She denies any manic or hypomanic like symptoms. She denies any PTSD symptoms for the last 8 yrs. Her sleep is good. She has good support from [employer], friends and therapist. Nancy Bradshaw has situational anxiety that is appropriate. She is sleeping well. She denies SI/HI.   Observations/Objective:  General Appearance: unable to assess  Eye Contact:  unable to assess  Speech:  Clear and Coherent and Normal Rate  Volume:  Normal  Mood:  Euthymic  Affect:  Full Range  Thought Process:  Goal Directed, Linear and Descriptions of Associations: Intact  Orientation:  Full (Time, Place, and Person)  Thought Content:  Logical  Suicidal Thoughts:  No  Homicidal Thoughts:  No  Memory:  Immediate;   Good  Judgement:  Good  Insight:  Good  Psychomotor Activity: unable to assess  Concentration:  Concentration:  Good  Recall:  Good  Fund of Knowledge:  Good  Language:  Good  Akathisia:  unable to assess  Handed:  Right  AIMS (if indicated):     Assets:  Communication Skills Desire for Improvement Financial Resources/Insurance Housing Leisure Time Physical Health Resilience Social Support Talents/Skills Transportation Vocational/Educational  ADL's:  unable to assess  Cognition:  WNL  Sleep:         Assessment and Plan:  Bipolar 2 d/o- stable; GAD- stable; PTSD- in remission; Alcohol use disorder in remission  Continue: Geodon 80mg  po qD Remeron 45mg  po qD Lamictal 300mg  po qD  She is getting physical and blood work in November 2021  Actively participating in [place]  Is going to therapy on scheduled basis  Follow Up Instructions: In 6 months or sooner if needed    I discussed the assessment and treatment plan with the patient. The patient was provided an opportunity to ask questions and all were answered. The patient agreed with the plan and demonstrated an understanding of the instructions.   The patient was advised to call back or seek an in-person evaluation if the symptoms worsen or if the condition fails to improve as anticipated.  I provided 15 minutes of non-face-to-face time during this encounter.   Charlcie Cradle, MD

## 2020-09-13 DIAGNOSIS — Z20822 Contact with and (suspected) exposure to covid-19: Secondary | ICD-10-CM | POA: Diagnosis not present

## 2020-09-21 ENCOUNTER — Ambulatory Visit: Payer: BC Managed Care – PPO

## 2020-09-27 DIAGNOSIS — Z20822 Contact with and (suspected) exposure to covid-19: Secondary | ICD-10-CM | POA: Diagnosis not present

## 2020-10-02 DIAGNOSIS — Z0001 Encounter for general adult medical examination with abnormal findings: Secondary | ICD-10-CM | POA: Diagnosis not present

## 2020-10-02 DIAGNOSIS — E559 Vitamin D deficiency, unspecified: Secondary | ICD-10-CM | POA: Diagnosis not present

## 2020-10-04 DIAGNOSIS — Z20822 Contact with and (suspected) exposure to covid-19: Secondary | ICD-10-CM | POA: Diagnosis not present

## 2020-10-11 DIAGNOSIS — Z20822 Contact with and (suspected) exposure to covid-19: Secondary | ICD-10-CM | POA: Diagnosis not present

## 2020-10-25 DIAGNOSIS — Z20822 Contact with and (suspected) exposure to covid-19: Secondary | ICD-10-CM | POA: Diagnosis not present

## 2020-10-30 ENCOUNTER — Ambulatory Visit: Payer: BC Managed Care – PPO

## 2020-11-04 ENCOUNTER — Encounter: Payer: 59 | Admitting: Family Medicine

## 2020-11-06 DIAGNOSIS — Z20822 Contact with and (suspected) exposure to covid-19: Secondary | ICD-10-CM | POA: Diagnosis not present

## 2020-11-09 ENCOUNTER — Ambulatory Visit
Admission: RE | Admit: 2020-11-09 | Discharge: 2020-11-09 | Disposition: A | Discharge status: Home / Self Care | Payer: BC Managed Care – PPO | Source: Ambulatory Visit | Attending: Family Medicine | Admitting: Family Medicine

## 2020-11-09 ENCOUNTER — Other Ambulatory Visit: Payer: Self-pay

## 2020-11-09 DIAGNOSIS — Z1231 Encounter for screening mammogram for malignant neoplasm of breast: Secondary | ICD-10-CM | POA: Diagnosis not present

## 2020-11-15 DIAGNOSIS — Z20822 Contact with and (suspected) exposure to covid-19: Secondary | ICD-10-CM | POA: Diagnosis not present

## 2020-11-21 DIAGNOSIS — Z20822 Contact with and (suspected) exposure to covid-19: Secondary | ICD-10-CM | POA: Diagnosis not present

## 2020-12-06 DIAGNOSIS — Z20822 Contact with and (suspected) exposure to covid-19: Secondary | ICD-10-CM | POA: Diagnosis not present

## 2020-12-10 ENCOUNTER — Ambulatory Visit: Payer: BC Managed Care – PPO

## 2020-12-11 DIAGNOSIS — Z20822 Contact with and (suspected) exposure to covid-19: Secondary | ICD-10-CM | POA: Diagnosis not present

## 2020-12-18 DIAGNOSIS — Z20822 Contact with and (suspected) exposure to covid-19: Secondary | ICD-10-CM | POA: Diagnosis not present

## 2020-12-25 DIAGNOSIS — Z20822 Contact with and (suspected) exposure to covid-19: Secondary | ICD-10-CM | POA: Diagnosis not present

## 2021-01-01 DIAGNOSIS — Z20822 Contact with and (suspected) exposure to covid-19: Secondary | ICD-10-CM | POA: Diagnosis not present

## 2021-01-08 DIAGNOSIS — Z20822 Contact with and (suspected) exposure to covid-19: Secondary | ICD-10-CM | POA: Diagnosis not present

## 2021-01-15 DIAGNOSIS — Z20822 Contact with and (suspected) exposure to covid-19: Secondary | ICD-10-CM | POA: Diagnosis not present

## 2021-01-19 DIAGNOSIS — N920 Excessive and frequent menstruation with regular cycle: Secondary | ICD-10-CM | POA: Diagnosis not present

## 2021-01-19 DIAGNOSIS — E669 Obesity, unspecified: Secondary | ICD-10-CM | POA: Diagnosis not present

## 2021-01-19 DIAGNOSIS — Z113 Encounter for screening for infections with a predominantly sexual mode of transmission: Secondary | ICD-10-CM | POA: Diagnosis not present

## 2021-01-19 DIAGNOSIS — F418 Other specified anxiety disorders: Secondary | ICD-10-CM | POA: Diagnosis not present

## 2021-01-19 DIAGNOSIS — Z0001 Encounter for general adult medical examination with abnormal findings: Secondary | ICD-10-CM | POA: Diagnosis not present

## 2021-01-26 DIAGNOSIS — D508 Other iron deficiency anemias: Secondary | ICD-10-CM | POA: Diagnosis not present

## 2021-01-26 DIAGNOSIS — N12 Tubulo-interstitial nephritis, not specified as acute or chronic: Secondary | ICD-10-CM | POA: Diagnosis not present

## 2021-01-27 DIAGNOSIS — Z20822 Contact with and (suspected) exposure to covid-19: Secondary | ICD-10-CM | POA: Diagnosis not present

## 2021-02-05 DIAGNOSIS — Z20822 Contact with and (suspected) exposure to covid-19: Secondary | ICD-10-CM | POA: Diagnosis not present

## 2021-02-18 ENCOUNTER — Encounter (HOSPITAL_COMMUNITY): Payer: Self-pay | Admitting: Psychiatry

## 2021-02-18 ENCOUNTER — Telehealth (INDEPENDENT_AMBULATORY_CARE_PROVIDER_SITE_OTHER): Payer: BC Managed Care – PPO | Admitting: Psychiatry

## 2021-02-18 ENCOUNTER — Other Ambulatory Visit: Payer: Self-pay

## 2021-02-18 DIAGNOSIS — F411 Generalized anxiety disorder: Secondary | ICD-10-CM | POA: Diagnosis not present

## 2021-02-18 DIAGNOSIS — F4310 Post-traumatic stress disorder, unspecified: Secondary | ICD-10-CM

## 2021-02-18 DIAGNOSIS — F3181 Bipolar II disorder: Secondary | ICD-10-CM | POA: Diagnosis not present

## 2021-02-18 MED ORDER — MIRTAZAPINE 45 MG PO TABS: 45.0000 mg | ORAL_TABLET | Freq: Every day | ORAL | 1 refills | Status: DC

## 2021-02-18 MED ORDER — ZIPRASIDONE HCL 80 MG PO CAPS: ORAL_CAPSULE | ORAL | 1 refills | Status: DC

## 2021-02-18 MED ORDER — LAMOTRIGINE 200 MG PO TABS: 300.0000 mg | ORAL_TABLET | Freq: Every day | ORAL | 1 refills | Status: DC

## 2021-02-18 NOTE — Progress Notes (Signed)
Virtual Visit via Video Note  I connected with Nancy Bradshaw on 02/18/21 at  8:00 AM EDT by a video enabled telemedicine application and verified that I am speaking with the correct person using two identifiers.  Location: Patient: home Provider: office   I discussed the limitations of evaluation and management by telemedicine and the availability of in person appointments. The patient expressed understanding and agreed to proceed.  History of Present Illness: Nancy Bradshaw states she is doing well. Her mood has been stable. She is eating and sleeping well. She denies isolation and and anhedonia. Pt denies recent manic and hypomanic symptoms and episodes including periods of decreased need for sleep, increased energy, mood lability, impulsivity, FOI, and excessive spending. She has an occasional day where her energy is mildly up and she will end up doing some chores but nothing impulsive. Her anxiety is mild and manageable and mostly related to situational stressors. She is denying any PTSD symptoms and notes that she has not even experienced any reactions to previous triggers. Nancy Bradshaw is still missing her father. She is going to [facility] and will attend hospice grief therapy groups when they start up again.    Observations/Objective: Psychiatric Specialty Exam: ROS  There were no vitals taken for this visit.There is no height or weight on file to calculate BMI.  General Appearance: Casual and Fairly Groomed  Eye Contact:  Good  Speech:  Clear and Coherent and Normal Rate  Volume:  Normal  Mood:  Euthymic  Affect:  Full Range  Thought Process:  Goal Directed, Linear and Descriptions of Associations: Intact  Orientation:  Full (Time, Place, and Person)  Thought Content:  Logical  Suicidal Thoughts:  No  Homicidal Thoughts:  No  Memory:  Immediate;   Good  Judgement:  Good  Insight:  Good  Psychomotor Activity:  Normal  Concentration:  Concentration: Good  Recall:  Good  Fund of  Knowledge:  Good  Language:  Good  Akathisia:  No  Handed:  Right  AIMS (if indicated):     Assets:  Communication Skills Desire for Improvement Financial Resources/Insurance Housing Resilience Social Support Talents/Skills Transportation Vocational/Educational  ADL's:  Intact  Cognition:  WNL  Sleep:       Assessment and Plan:  Depression screen [facility] 2/9 02/18/2021 03/06/2020 11/01/2019 07/31/2019 05/01/2019  Decreased Interest 0 0 0 0 0  Down, Depressed, Hopeless 0 0 0 0 0  PHQ - 2 Score 0 0 0 0 0    Flowsheet Row Video Visit from 02/18/2021 in [place] No Risk     1. Bipolar II disorder in full remission ([place]) - lamoTRIgine (LAMICTAL) 200 MG tablet; Take 1.5 tablets (300 mg total) by mouth daily.  Dispense: 135 tablet; Refill: 1 - mirtazapine (REMERON) 45 MG tablet; Take 1 tablet (45 mg total) by mouth at bedtime.  Dispense: 90 tablet; Refill: 1 - ziprasidone (GEODON) 80 MG capsule; TAKE 1 CAPSULE BY MOUTH EVERY DAY  Dispense: 90 capsule; Refill: 1  2. PTSD (post-traumatic stress disorder) - mirtazapine (REMERON) 45 MG tablet; Take 1 tablet (45 mg total) by mouth at bedtime.  Dispense: 90 tablet; Refill: 1  3. GAD (generalized anxiety disorder) - mirtazapine (REMERON) 45 MG tablet; Take 1 tablet (45 mg total) by mouth at bedtime.  Dispense: 90 tablet; Refill: 1     Pt is going to therapy thru [facility] and hospice  Pt has a PCP appointment on 05/21/21 and have labs and  EKG done then  Follow Up Instructions: In 5-6 months or sooner if needed   I discussed the assessment and treatment plan with the patient. The patient was provided an opportunity to ask questions and all were answered. The patient agreed with the plan and demonstrated an understanding of the instructions.   The patient was advised to call back or seek an in-person evaluation if the symptoms worsen or if the condition fails to improve as  anticipated.   Charlcie Cradle, MD

## 2021-02-19 DIAGNOSIS — Z20822 Contact with and (suspected) exposure to covid-19: Secondary | ICD-10-CM | POA: Diagnosis not present

## 2021-02-26 DIAGNOSIS — Z23 Encounter for immunization: Secondary | ICD-10-CM | POA: Diagnosis not present

## 2021-03-05 DIAGNOSIS — Z20822 Contact with and (suspected) exposure to covid-19: Secondary | ICD-10-CM | POA: Diagnosis not present

## 2021-03-21 DIAGNOSIS — Z20822 Contact with and (suspected) exposure to covid-19: Secondary | ICD-10-CM | POA: Diagnosis not present

## 2021-03-31 DIAGNOSIS — Z20822 Contact with and (suspected) exposure to covid-19: Secondary | ICD-10-CM | POA: Diagnosis not present

## 2021-04-06 DIAGNOSIS — E785 Hyperlipidemia, unspecified: Secondary | ICD-10-CM | POA: Diagnosis not present

## 2021-04-06 DIAGNOSIS — Z0001 Encounter for general adult medical examination with abnormal findings: Secondary | ICD-10-CM | POA: Diagnosis not present

## 2021-04-06 DIAGNOSIS — R7301 Impaired fasting glucose: Secondary | ICD-10-CM | POA: Diagnosis not present

## 2021-04-06 DIAGNOSIS — I10 Essential (primary) hypertension: Secondary | ICD-10-CM | POA: Diagnosis not present

## 2021-04-11 DIAGNOSIS — Z20822 Contact with and (suspected) exposure to covid-19: Secondary | ICD-10-CM | POA: Diagnosis not present

## 2021-04-13 DIAGNOSIS — I10 Essential (primary) hypertension: Secondary | ICD-10-CM | POA: Diagnosis not present

## 2021-04-13 DIAGNOSIS — Z0001 Encounter for general adult medical examination with abnormal findings: Secondary | ICD-10-CM | POA: Diagnosis not present

## 2021-04-13 DIAGNOSIS — E785 Hyperlipidemia, unspecified: Secondary | ICD-10-CM | POA: Diagnosis not present

## 2021-04-13 DIAGNOSIS — F319 Bipolar disorder, unspecified: Secondary | ICD-10-CM | POA: Diagnosis not present

## 2021-04-18 DIAGNOSIS — Z20822 Contact with and (suspected) exposure to covid-19: Secondary | ICD-10-CM | POA: Diagnosis not present

## 2021-05-16 DIAGNOSIS — Z20822 Contact with and (suspected) exposure to covid-19: Secondary | ICD-10-CM | POA: Diagnosis not present

## 2021-05-23 DIAGNOSIS — Z20822 Contact with and (suspected) exposure to covid-19: Secondary | ICD-10-CM | POA: Diagnosis not present

## 2021-05-30 DIAGNOSIS — Z20822 Contact with and (suspected) exposure to covid-19: Secondary | ICD-10-CM | POA: Diagnosis not present

## 2021-06-06 DIAGNOSIS — Z20822 Contact with and (suspected) exposure to covid-19: Secondary | ICD-10-CM | POA: Diagnosis not present

## 2021-06-13 DIAGNOSIS — Z20822 Contact with and (suspected) exposure to covid-19: Secondary | ICD-10-CM | POA: Diagnosis not present

## 2021-07-04 DIAGNOSIS — Z20822 Contact with and (suspected) exposure to covid-19: Secondary | ICD-10-CM | POA: Diagnosis not present

## 2021-07-11 DIAGNOSIS — Z20822 Contact with and (suspected) exposure to covid-19: Secondary | ICD-10-CM | POA: Diagnosis not present

## 2021-07-25 DIAGNOSIS — Z20822 Contact with and (suspected) exposure to covid-19: Secondary | ICD-10-CM | POA: Diagnosis not present

## 2021-08-06 DIAGNOSIS — Z20822 Contact with and (suspected) exposure to covid-19: Secondary | ICD-10-CM | POA: Diagnosis not present

## 2021-08-12 ENCOUNTER — Telehealth (HOSPITAL_BASED_OUTPATIENT_CLINIC_OR_DEPARTMENT_OTHER): Payer: BC Managed Care – PPO | Admitting: Psychiatry

## 2021-08-12 ENCOUNTER — Other Ambulatory Visit: Payer: Self-pay

## 2021-08-12 DIAGNOSIS — F3181 Bipolar II disorder: Secondary | ICD-10-CM

## 2021-08-12 DIAGNOSIS — F4310 Post-traumatic stress disorder, unspecified: Secondary | ICD-10-CM

## 2021-08-12 DIAGNOSIS — F411 Generalized anxiety disorder: Secondary | ICD-10-CM

## 2021-08-12 MED ORDER — MIRTAZAPINE 45 MG PO TABS: 45.0000 mg | ORAL_TABLET | Freq: Every day | ORAL | 1 refills | Status: DC

## 2021-08-12 MED ORDER — LAMOTRIGINE 200 MG PO TABS: 300.0000 mg | ORAL_TABLET | Freq: Every day | ORAL | 1 refills | Status: DC

## 2021-08-12 MED ORDER — ZIPRASIDONE HCL 80 MG PO CAPS: ORAL_CAPSULE | ORAL | 1 refills | Status: DC

## 2021-08-12 NOTE — Progress Notes (Signed)
Virtual Visit via Video Note  I connected with Nancy Bradshaw on 08/12/21 at  8:00 AM EDT by a video enabled telemedicine application and verified that I am speaking with the correct person using two identifiers.  Location: Patient: home Provider: work   I discussed the limitations of evaluation and management by telemedicine and the availability of in person appointments. The patient expressed understanding and agreed to proceed.  History of Present Illness: Nancy Bradshaw shares she is doing well overall. She is somewhat worried about her daughter. Her daughter started college in [place] and has [place]. She is taking a while to get better. Nancy Bradshaw is using her coping skills and it is helping. She is denying any depressive episodes since we last met. She is sleeping and eating well. Nancy Bradshaw occasionally has a night where she wakes up in the middle of the night for no reason every month or so. Pt denies recent manic and hypomanic symptoms including periods of decreased need for sleep, increased energy, mood lability, impulsivity, FOI, and excessive spending. Nancy Bradshaw tends to put on about 5 lbs at the end of the summer and is not worried about it. She is still very active and goes hiking and does yoga. Nancy Bradshaw states that work stress is well managed. She is still grieving her father's passing and she is going to join a hospice grief group. The grief is getting better slowly. Nancy Bradshaw denies SI/HI.  At her last PCP visit her cholesterol was elevated and her PCP recommended diet and exercise. Her mental health meds are working well.   Observations/Objective:  Psychiatric Specialty Exam: ROS  There were no vitals taken for this visit.There is no height or weight on file to calculate BMI.  General Appearance: Fairly Groomed and Neat  Eye Contact:  Good  Speech:  Clear and Coherent and Normal Rate  Volume:  Normal  Mood:  Anxious  Affect:  Full Range  Thought Process:  Goal Directed,  Linear, and Descriptions of Associations: Intact  Orientation:  Full (Time, Place, and Person)  Thought Content:  Logical  Suicidal Thoughts:  No  Homicidal Thoughts:  No  Memory:  Immediate;   Good  Judgement:  Good  Insight:  Good  Psychomotor Activity:  Normal  Concentration:  Concentration: Good  Recall:  Good  Fund of Knowledge:  Good  Language:  Good  Akathisia:  No  Handed:  Right  AIMS (if indicated):     Assets:  Communication Skills Desire for Improvement Financial Resources/Insurance Housing Leisure Time [place] Talents/Skills Transportation Vocational/Educational  ADL's:  Intact  Cognition:  WNL  Sleep:         Assessment and Plan: Depression screen [facility] 2/9 08/12/2021 02/18/2021 03/06/2020 11/01/2019 07/31/2019  Decreased Interest 0 0 0 0 0  Down, Depressed, Hopeless 0 0 0 0 0  PHQ - 2 Score 0 0 0 0 0    Flowsheet Row Video Visit from 08/12/2021 in [facility] Video Visit from 02/18/2021 in [place] No Risk No Risk       - requesting labs and notes from PCP as Nancy Bradshaw had labs drawn in 03/2021.  1. Bipolar II disorder in full remission ([place]) - lamoTRIgine (LAMICTAL) 200 MG tablet; Take 1.5 tablets (300 mg total) by mouth daily.  Dispense: 135 tablet; Refill: 1 - mirtazapine (REMERON) 45 MG tablet; Take 1 tablet (45 mg total) by mouth at bedtime.  Dispense: 90 tablet; Refill:  1 - ziprasidone (GEODON) 80 MG capsule; TAKE 1 CAPSULE BY MOUTH EVERY DAY  Dispense: 90 capsule; Refill: 1  2. PTSD (post-traumatic stress disorder) - mirtazapine (REMERON) 45 MG tablet; Take 1 tablet (45 mg total) by mouth at bedtime.  Dispense: 90 tablet; Refill: 1  3. GAD (generalized anxiety disorder) - mirtazapine (REMERON) 45 MG tablet; Take 1 tablet (45 mg total) by mouth at bedtime.  Dispense: 90 tablet; Refill: 1  Follow Up  Instructions: In 5-6 months or sooner if needed   I discussed the assessment and treatment plan with the patient. The patient was provided an opportunity to ask questions and all were answered. The patient agreed with the plan and demonstrated an understanding of the instructions.   The patient was advised to call back or seek an in-person evaluation if the symptoms worsen or if the condition fails to improve as anticipated.  I provided 18 minutes of non-face-to-face time during this encounter.   Charlcie Cradle, MD

## 2021-11-17 ENCOUNTER — Other Ambulatory Visit: Payer: Self-pay | Admitting: Family Medicine

## 2021-11-17 DIAGNOSIS — Z1231 Encounter for screening mammogram for malignant neoplasm of breast: Secondary | ICD-10-CM

## 2021-12-03 ENCOUNTER — Ambulatory Visit
Admission: RE | Admit: 2021-12-03 | Discharge: 2021-12-03 | Disposition: A | Discharge status: Home / Self Care | Payer: BC Managed Care – PPO | Source: Ambulatory Visit

## 2021-12-03 DIAGNOSIS — Z1231 Encounter for screening mammogram for malignant neoplasm of breast: Secondary | ICD-10-CM

## 2022-01-20 ENCOUNTER — Telehealth (HOSPITAL_BASED_OUTPATIENT_CLINIC_OR_DEPARTMENT_OTHER): Payer: BC Managed Care – PPO | Admitting: Psychiatry

## 2022-01-20 ENCOUNTER — Encounter (HOSPITAL_COMMUNITY): Payer: Self-pay | Admitting: Psychiatry

## 2022-01-20 ENCOUNTER — Other Ambulatory Visit: Payer: Self-pay

## 2022-01-20 DIAGNOSIS — F3181 Bipolar II disorder: Secondary | ICD-10-CM | POA: Diagnosis not present

## 2022-01-20 DIAGNOSIS — F411 Generalized anxiety disorder: Secondary | ICD-10-CM

## 2022-01-20 DIAGNOSIS — F4310 Post-traumatic stress disorder, unspecified: Secondary | ICD-10-CM | POA: Diagnosis not present

## 2022-01-20 MED ORDER — MIRTAZAPINE 45 MG PO TABS: 45.0000 mg | ORAL_TABLET | Freq: Every day | ORAL | 1 refills | Status: DC

## 2022-01-20 MED ORDER — ZIPRASIDONE HCL 80 MG PO CAPS: ORAL_CAPSULE | ORAL | 1 refills | Status: DC

## 2022-01-20 MED ORDER — LAMOTRIGINE 200 MG PO TABS: 300.0000 mg | ORAL_TABLET | Freq: Every day | ORAL | 1 refills | Status: DC

## 2022-01-20 NOTE — Progress Notes (Signed)
Virtual Visit via Video Note ? ?I connected with Nancy Bradshaw on 01/20/22 at  8:00 AM EST by a video enabled telemedicine application and verified that I am speaking with the correct person using two identifiers. ? ?Location: ?Patient: home ?Provider: office ?  ?I discussed the limitations of evaluation and management by telemedicine and the availability of in person appointments. The patient expressed understanding and agreed to proceed. ? ?History of Present Illness: ?"Pretty good actually". Nancy Bradshaw shares that she got a raise and her daughter is good at college in [place]. In May she is going to on a backpacking trip and is looking forward to. Nancy Bradshaw is also planning several other trips to visit friends and family. Nancy Bradshaw is now in menopause according to her recent labs. She doesn't like it but has not noticed much change otherwise. Her sleep is good most nights. A few days a month she wakes up earlier than she usual and doesn't know why. Despite that she doesn't feel tired. Most days her energy is fair and she is tired by the end of the day. She is usually getting 7-8 hrs/night. Pt denies recent manic and hypomanic symptoms including periods of decreased need for sleep, increased energy, mood lability, impulsivity, FOI, and excessive spending. Nancy Bradshaw feels her mood is stable. She denies depression, crying spells, anhedonia and hopelessness. She denies SI/HI. She has anxiety on/off but her mediation and exercise really helps. Her dad's death anniversary is coming up and she is sad but doesn't think her reaction out of proportion. She has experienced some HV, avoidance and anxiety after being triggered by a movie. It lasted less than an hour and then resolved. She has not experienced any symptoms since then. Her meds remain effective.  ? ?  ?Observations/Objective: ?Psychiatric Specialty Exam: ?ROS  ?There were no vitals taken for this visit.There is no height or weight on file to calculate BMI.   ?General Appearance: Fairly Groomed and Neat  ?Eye Contact:  Good  ?Speech:  Clear and Coherent and Normal Rate  ?Volume:  Normal  ?Mood:  Euthymic  ?Affect:  Full Range  ?Thought Process:  Goal Directed, Linear, and Descriptions of Associations: Intact  ?Orientation:  Full (Time, Place, and Person)  ?Thought Content:  Logical  ?Suicidal Thoughts:  No  ?Homicidal Thoughts:  No  ?Memory:  Immediate;   Good  ?Judgement:  Good  ?Insight:  Good  ?Psychomotor Activity:  Normal  ?Concentration:  Concentration: Good  ?Recall:  Good  ?Fund of Knowledge:  Good  ?Language:  Good  ?Akathisia:  No  ?Handed:  Right  ?AIMS (if indicated):     ?Assets:  Communication Skills ?Desire for Improvement ?Financial Resources/Insurance ?Housing ?Leisure Time ?Physical Health ?Resilience ?Social Support ?Talents/Skills ?Transportation ?Vocational/Educational  ?ADL's:  Intact  ?Cognition:  WNL  ?Sleep:     ? ? ? ?Assessment and Plan: ?Depression screen [facility] 2/9 01/20/2022 08/12/2021 02/18/2021 03/06/2020 11/01/2019  ?Decreased Interest 0 0 0 0 0  ?Down, Depressed, Hopeless 0 0 0 0 0  ?PHQ - 2 Score 0 0 0 0 0  ? ? ?Flowsheet Row Video Visit from 01/20/2022 in [facility] Video Visit from 08/12/2021 in [facility] Video Visit from 02/18/2021 in [facility]  ?C-SSRS RISK CATEGORY No Risk No Risk No Risk  ? ?  ? ?Nancy Bradshaw had a PCP appointment with labs and EKG in Jan 2023. She has signed a release to send it to me. Nancy Bradshaw  will get copies and bring them to the office.  She has high cholesterol but is working on it.  ? ?She has no had any alcohol since 2006. She gave up all benzos in 2008. She goes to [employer] 1x/week and has a sponsor.  ? ?1. Bipolar II disorder in full remission ([place]) ?- lamoTRIgine (LAMICTAL) 200 MG tablet; Take 1.5 tablets (300 mg total) by mouth daily.  Dispense: 135 tablet; Refill: 1 ?- mirtazapine (REMERON) 45  MG tablet; Take 1 tablet (45 mg total) by mouth at bedtime.  Dispense: 90 tablet; Refill: 1 ?- ziprasidone (GEODON) 80 MG capsule; TAKE 1 CAPSULE BY MOUTH EVERY DAY  Dispense: 90 capsule; Refill: 1 ? ?2. PTSD (post-traumatic stress disorder) ?- mirtazapine (REMERON) 45 MG tablet; Take 1 tablet (45 mg total) by mouth at bedtime.  Dispense: 90 tablet; Refill: 1 ? ?3. GAD (generalized anxiety disorder) ?- mirtazapine (REMERON) 45 MG tablet; Take 1 tablet (45 mg total) by mouth at bedtime.  Dispense: 90 tablet; Refill: 1 ? ? ? ? ?Follow Up Instructions: ?In 5-6 months or sooner if needed ?  ?I discussed the assessment and treatment plan with the patient. The patient was provided an opportunity to ask questions and all were answered. The patient agreed with the plan and demonstrated an understanding of the instructions. ?  ?The patient was advised to call back or seek an in-person evaluation if the symptoms worsen or if the condition fails to improve as anticipated. ? ?I provided 18 minutes of non-face-to-face time during this encounter. ? ? ?Charlcie Cradle, MD ? ?

## 2022-02-11 IMAGING — MG DIGITAL SCREENING BILAT W/ CAD
4 series · 4 of 4 positions shown · non-contrast
Comparison: Previous exam(s).

CLINICAL DATA: Screening.

EXAM:
DIGITAL SCREENING BILATERAL MAMMOGRAM WITH CAD

[L MLO]
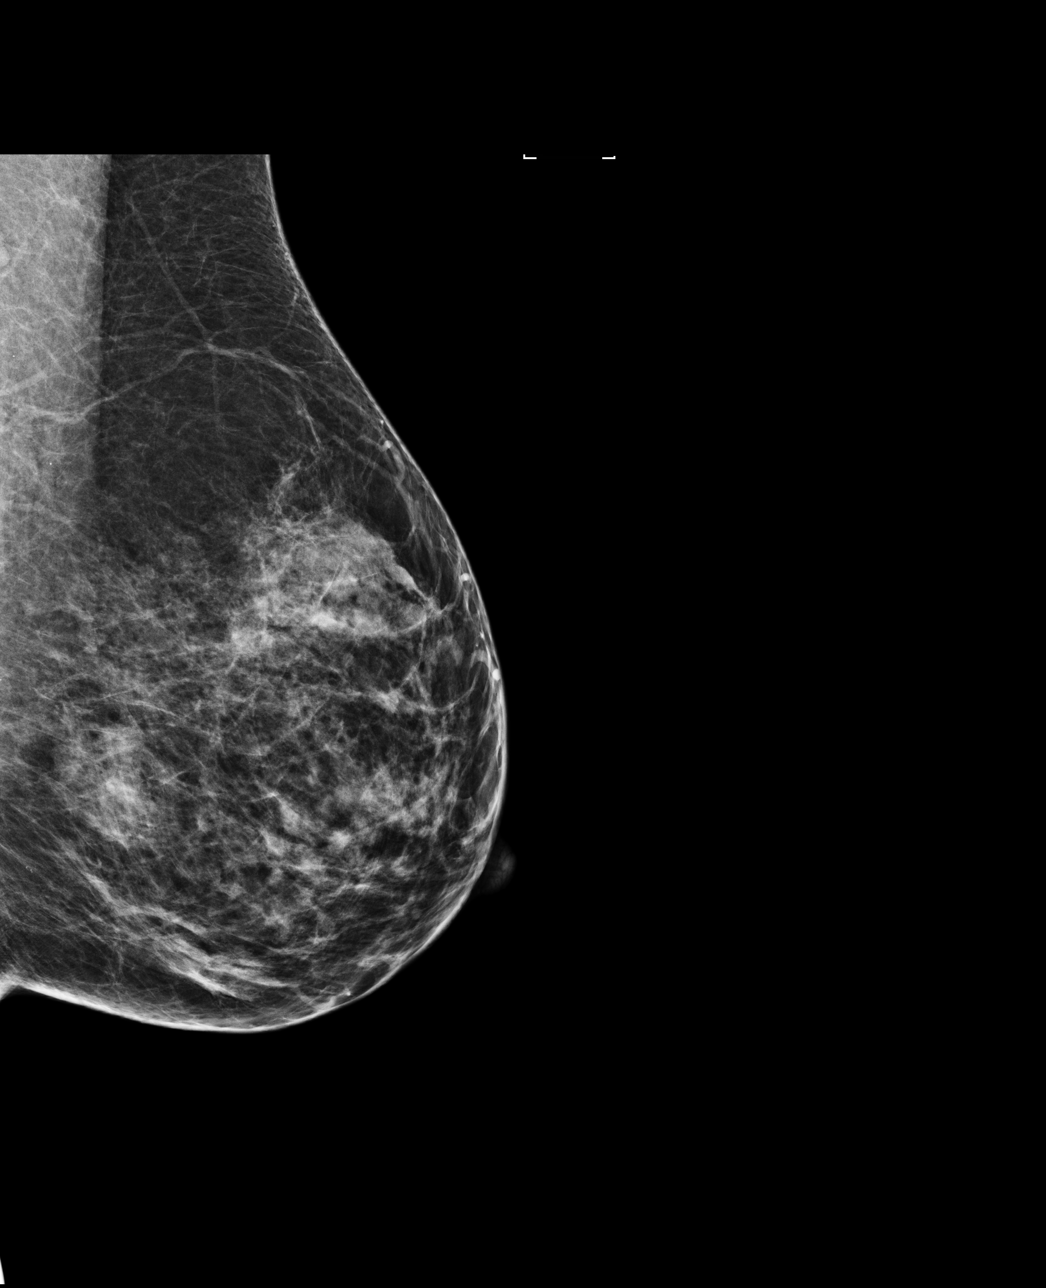

[R MLO]
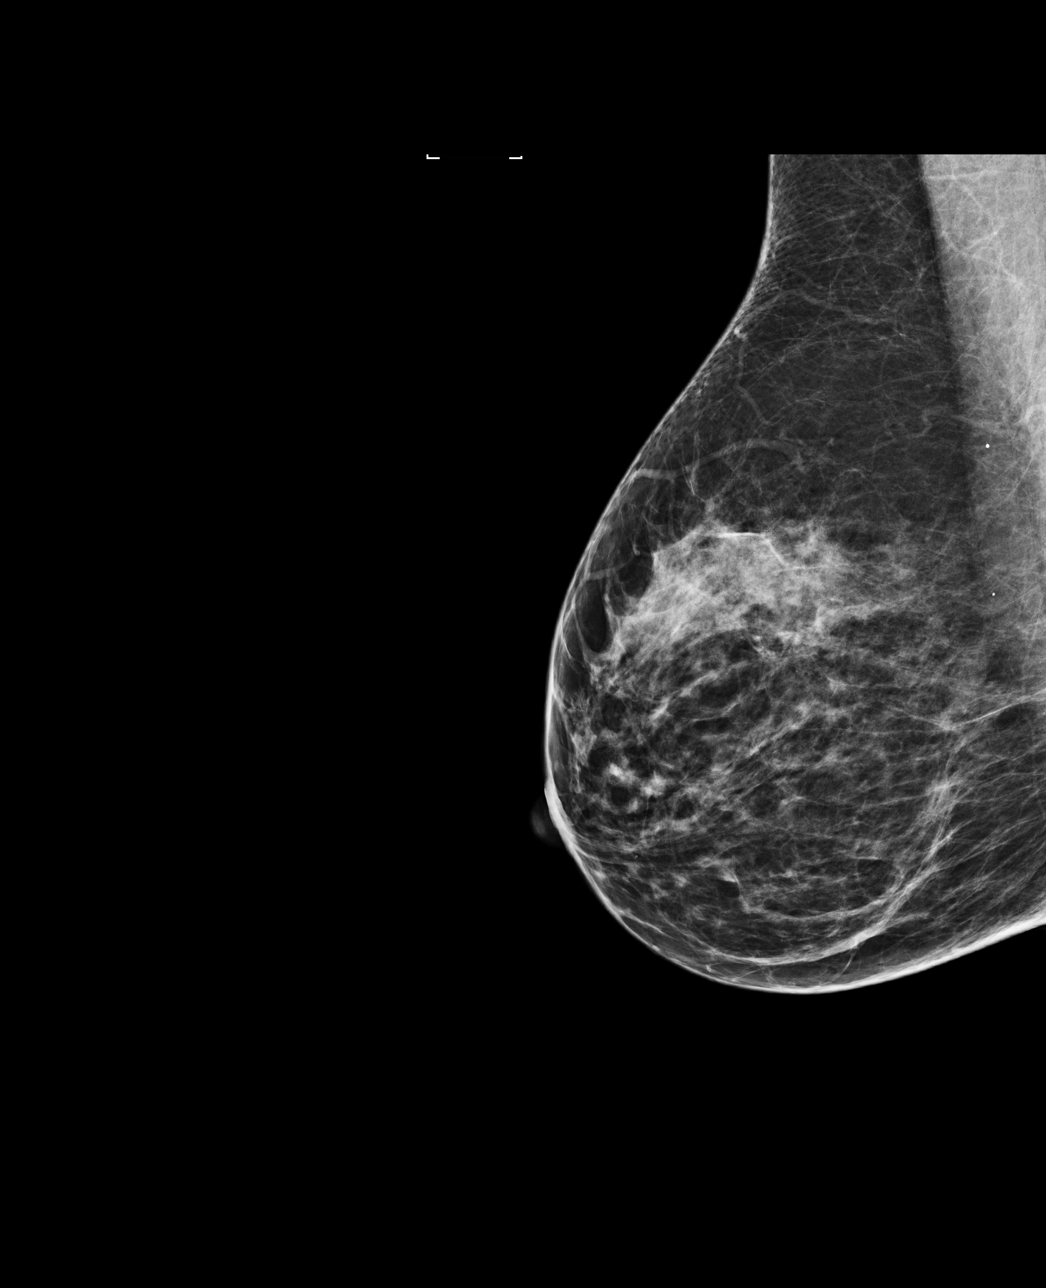

[R CC]
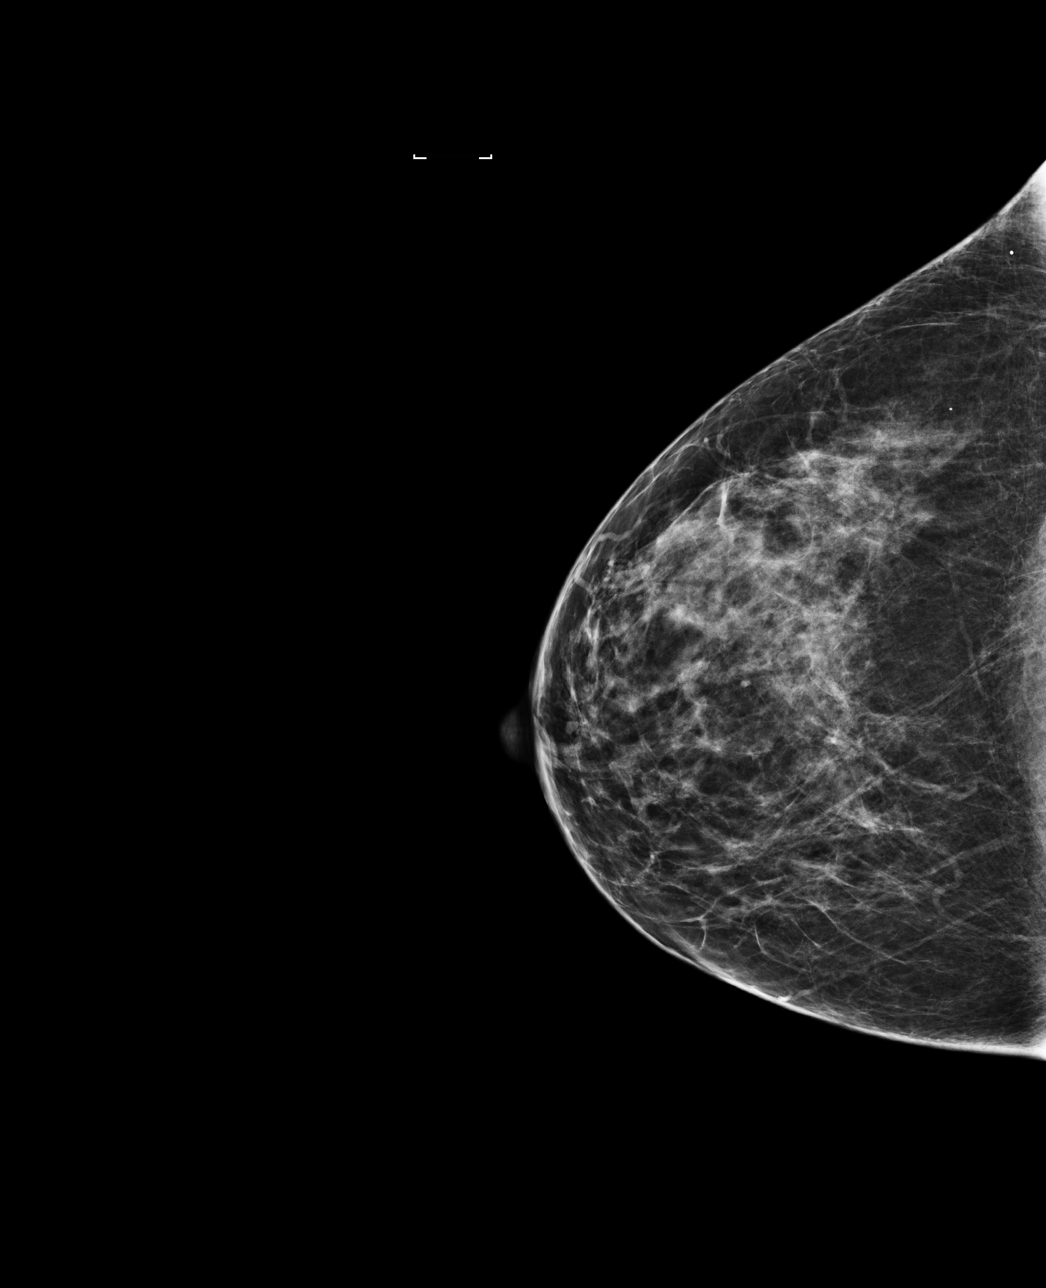

[L CC]
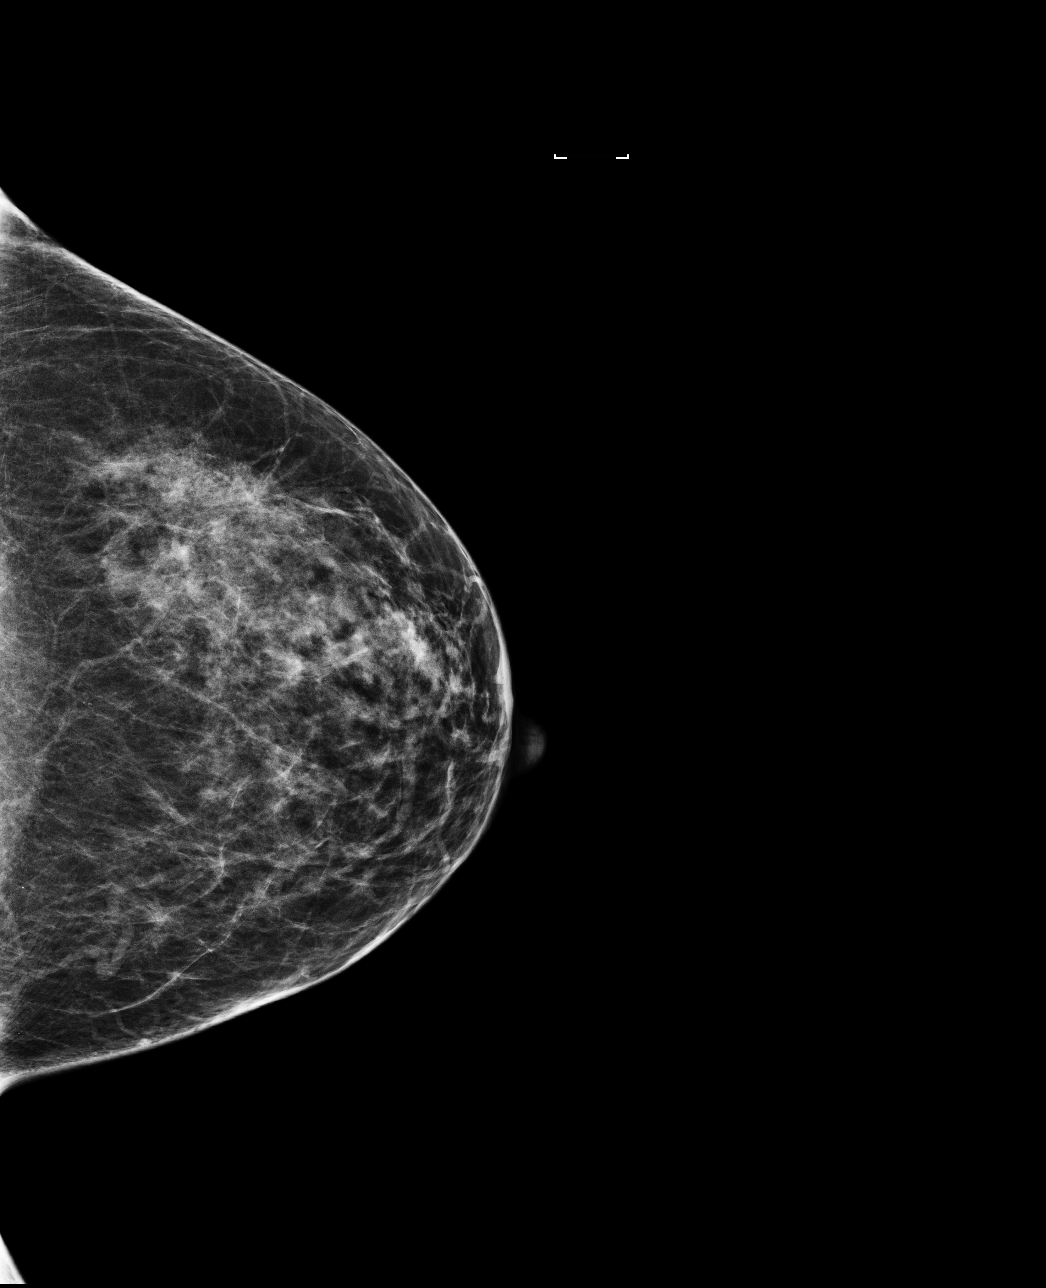

[4 of 4 positions shown; findings below may reference images not displayed]

ACR Breast Density Category c: The breast tissue is heterogeneously
dense, which may obscure small masses.
FINDINGS: There are no findings suspicious for malignancy. Images were
processed with CAD.
IMPRESSION: No mammographic evidence of malignancy. A result letter of this
screening mammogram will be mailed directly to the patient.

RECOMMENDATION:
Screening mammogram in one year. (Code:[id number])

BI-RADS CATEGORY  1: Negative.

## 2022-07-14 ENCOUNTER — Telehealth (HOSPITAL_BASED_OUTPATIENT_CLINIC_OR_DEPARTMENT_OTHER): Payer: 59 | Admitting: Psychiatry

## 2022-07-14 ENCOUNTER — Encounter (HOSPITAL_COMMUNITY): Payer: Self-pay | Admitting: Psychiatry

## 2022-07-14 DIAGNOSIS — F411 Generalized anxiety disorder: Secondary | ICD-10-CM | POA: Diagnosis not present

## 2022-07-14 DIAGNOSIS — F3181 Bipolar II disorder: Secondary | ICD-10-CM | POA: Diagnosis not present

## 2022-07-14 DIAGNOSIS — F4310 Post-traumatic stress disorder, unspecified: Secondary | ICD-10-CM | POA: Diagnosis not present

## 2022-07-14 MED ORDER — MIRTAZAPINE 45 MG PO TABS: 45.0000 mg | ORAL_TABLET | Freq: Every day | ORAL | 1 refills | Status: DC

## 2022-07-14 MED ORDER — ZIPRASIDONE HCL 80 MG PO CAPS: ORAL_CAPSULE | ORAL | 1 refills | Status: DC

## 2022-07-14 MED ORDER — LAMOTRIGINE 200 MG PO TABS: 300.0000 mg | ORAL_TABLET | Freq: Every day | ORAL | 1 refills | Status: DC

## 2022-07-14 NOTE — Progress Notes (Signed)
Virtual Visit via Video Note  I connected with Nancy Bradshaw on 07/14/22 at  8:00 AM EDT by  a video enabled telemedicine application and verified that I am speaking with the correct person using two identifiers.  Location: Patient: home Provider: office   I discussed the limitations of evaluation and management by telemedicine and the availability of in person appointments. The patient expressed understanding and agreed to proceed.  History of Present Illness: Nancy Bradshaw is 15 years sober today. She is very proud of her success. She does not really thinking about drinking anymore. If the random thought does come she has no desire to engage it when she thinks back on what it was like. She is still active in [place] and has a sponsor and is a sponsor. [name] is active and engaging in her hobbies. She works full time and is teaching yoga. [name] is more sad after spending a weekend with her daughter. She denies depression and thinks it is more just missing her daughter. It does not interfere with her daily activities and the feeling lasts for a few days then goes away. On Saturdays she wakes up earlier than she wants to but then the rest of the week she sleeps well. [name] denies manic and hypomanic like symptoms. She denies SI/HI. What bothers her most is that she wakes every morning with anxiety. The thoughts center on her daughter's safety and getting to work on time. She states she has always been this way since childhood and anxiety runs in the family. She does think the Remeron helps to decrease the intensity of it. [name] recently saw a movie that triggered her for a few days. [name] rarely gets triggered and states work does not usually trigger her. She got over it and is fine now. She occassionally gets hypervigilant. She denies nightmares. [name] has been on this combination of medication without dose changes since around 2013. She worries that they will stop working at some point in time  in the future.    Observations/Objective: Psychiatric Specialty Exam: ROS  There were no vitals taken for this visit.There is no height or weight on file to calculate BMI.  General Appearance: Casual  Eye Contact:  Good  Speech:  Clear and Coherent and Normal Rate  Volume:  Normal  Mood:  Euthymic  Affect:  Full Range  Thought Process:  Goal Directed, Linear, and Descriptions of Associations: Intact  Orientation:  Full (Time, Place, and Person)  Thought Content:  Logical  Suicidal Thoughts:  No  Homicidal Thoughts:  No  Memory:  Immediate;   Good  Judgement:  Good  Insight:  Good  Psychomotor Activity:  Normal  Concentration:  Concentration: Good  Recall:  Good  Fund of Knowledge:  Good  Language:  Good  Akathisia:  No  Handed:  Right  AIMS (if indicated):     Assets:  Communication Skills Desire for Improvement Financial Resources/Insurance Housing Leisure Time Resilience Social Support Talents/Skills Transportation Vocational/Educational  ADL's:  Intact  Cognition:  WNL  Sleep:        Assessment and Plan:     07/14/2022    8:06 AM 01/20/2022    8:10 AM 08/12/2021    8:15 AM 02/18/2021    8:08 AM 03/06/2020    2:17 PM  Depression screen PHQ 2/9  Decreased Interest 0 0 0 0 0  Down, Depressed, Hopeless 0 0 0 0 0  PHQ - 2 Score 0 0 0 0 0    Flowsheet  Row Video Visit from 07/14/2022 in [facility] Video Visit from 01/20/2022 in [facility] Video Visit from 08/12/2021 in [facility] No Risk No Risk No Risk         Pt is aware that these meds carry a teratogenic risk. Pt will discuss plan of action if she does or plans to become pregnant in the future.  Status of current problems: stable  Meds:  1. Bipolar II disorder in full remission ([place]) - lamoTRIgine (LAMICTAL) 200 MG tablet; Take 1.5 tablets (300 mg total) by mouth  daily.  Dispense: 135 tablet; Refill: 1 - mirtazapine (REMERON) 45 MG tablet; Take 1 tablet (45 mg total) by mouth at bedtime.  Dispense: 90 tablet; Refill: 1 - ziprasidone (GEODON) 80 MG capsule; TAKE 1 CAPSULE BY MOUTH EVERY DAY  Dispense: 90 capsule; Refill: 1  2. PTSD (post-traumatic stress disorder) - mirtazapine (REMERON) 45 MG tablet; Take 1 tablet (45 mg total) by mouth at bedtime.  Dispense: 90 tablet; Refill: 1  3. GAD (generalized anxiety disorder) - mirtazapine (REMERON) 45 MG tablet; Take 1 tablet (45 mg total) by mouth at bedtime.  Dispense: 90 tablet; Refill: 1     Labs: at her recent PCP visit she found her cholesterol was elevated. She is working with her PCP for management.    Therapy: brief supportive therapy provided. Discussed psychosocial stressors in detail.    Collaboration of Care: Other none  Patient/Guardian was advised Release of Information must be obtained prior to any record release in order to collaborate their care with an outside provider. Patient/Guardian was advised if they have not already done so to contact the registration department to sign all necessary forms in order for [place] to release information regarding their care.   Consent: Patient/Guardian gives verbal consent for treatment and assignment of benefits for services provided during this visit. Patient/Guardian expressed understanding and agreed to proceed.     Follow Up Instructions: Follow up in 5-6 months or sooner if needed    I discussed the assessment and treatment plan with the patient. The patient was provided an opportunity to ask questions and all were answered. The patient agreed with the plan and demonstrated an understanding of the instructions.   The patient was advised to call back or seek an in-person evaluation if the symptoms worsen or if the condition fails to improve as anticipated.  I provided 23 minutes of non-face-to-face time during this encounter.   Charlcie Cradle, MD

## 2022-08-19 ENCOUNTER — Telehealth: Payer: Self-pay | Admitting: Cardiology

## 2022-08-19 NOTE — Telephone Encounter (Signed)
Left message for patient to call back       "Last seen >1 year ago. Needs OV to discuss. Pt of Dr. Harrell Gave.    Loel Dubonnet, NP "

## 2022-08-19 NOTE — Telephone Encounter (Signed)
Patient returned call and is scheduled with Dr. Harrell Gave

## 2022-08-19 NOTE — Telephone Encounter (Signed)
Last seen >1 year ago. Needs OV to discuss. Pt of Dr. Harrell Gave.   Nancy Dubonnet, NP

## 2022-08-19 NOTE — Telephone Encounter (Signed)
Patient states she just needs a calcium score test. She says her heart is fine she just has high cholesterol and wants to know if she needs an appointment or if she can just have the test ordered.

## 2022-10-12 ENCOUNTER — Telehealth (HOSPITAL_BASED_OUTPATIENT_CLINIC_OR_DEPARTMENT_OTHER): Payer: Self-pay | Admitting: Cardiology

## 2022-10-12 ENCOUNTER — Encounter (HOSPITAL_BASED_OUTPATIENT_CLINIC_OR_DEPARTMENT_OTHER): Payer: Self-pay | Admitting: Cardiology

## 2022-10-12 ENCOUNTER — Ambulatory Visit (HOSPITAL_BASED_OUTPATIENT_CLINIC_OR_DEPARTMENT_OTHER): Payer: Managed Care, Other (non HMO) | Admitting: Cardiology

## 2022-10-12 VITALS — BP 132/70 | HR 82 | Ht 64.75 in | Wt 164.0 lb

## 2022-10-12 DIAGNOSIS — Z7189 Other specified counseling: Secondary | ICD-10-CM | POA: Diagnosis not present

## 2022-10-12 DIAGNOSIS — Z8249 Family history of ischemic heart disease and other diseases of the circulatory system: Secondary | ICD-10-CM

## 2022-10-12 DIAGNOSIS — E7800 Pure hypercholesterolemia, unspecified: Secondary | ICD-10-CM | POA: Diagnosis not present

## 2022-10-12 NOTE — Progress Notes (Signed)
Cardiology Office Note:    Date:  10/12/2022   ID:  Nancy Bradshaw, DOB Nov 22, 1965, MRN [id number]  PCP:  Forrest Moron, MD  Cardiologist:  Buford Dresser, MD  Referring MD: Forrest Moron, MD   CC: Follow up for hypercholesterolemia  History of Present Illness:    Nancy Bradshaw is a 56 y.o. female with a hx of hypertension, hypercholesterolemia, bipolar disorder, PTSD who is seen for follow-up. She was initially seen on 12/09/2019 as a new consult at the request of Forrest Moron, MD for the evaluation and management of hypercholesterolemia.  Pertinent history: When she started geodon, lipids spiked. Also gained weight with this, was 30 lbs heavier at her peak. Had poor diet, wasn't active. At her initial appointment, she was very active, ate very healthy. Leaned vegetarian. Felt strongly that her numbers and cholesterol ratio are fine. We reviewed her lipids, ASCVD risk score, and risk factors.  Tobacco use history: former, none in >5 years. 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 Asymptomatic Current diet: minimal animal proteins, some cheese/dairy but overall Mediterranean based. Does best on high protein/low carb diet.  At her last appointment, she confirmed that she was not interest in starting statin therapy. She was concerned that her father had myalgias and brain fog on statin. 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 She endorses high level of medical literacy and states that she has done her research on the topic. A coronary calcium score was ordered and performed on 01/10/2020, which showed a calcium score of 0.  Today, she appears well. For her diet, she went almost dairy free for 1 month due to her alpha gal allergy and environmental reasons. She also went more vegetarian recently. However, this did not result in improved cholesterol levels. She says that she does not want to  take a statin unless absolutely necessary. Currently she is taking red yeast rice supplements. At length we discussed non-statin alternatives.  She is also on geodon for bipolar disorder, which she is tolerating and does not want to change.  For exercise, she regularly engages in physical activity. She is an avid Psychologist, forensic.  She used to have palpitations 10+ years ago, which she described as a "skipped beat". She believes that it was anxiety-related.  Her father began statin therapy in his 43s, with side effects of leg cramping and brain fog. Her father and uncle both had a Hx of gout. Her father died at 15 due to chronic leukemia.   She denies any chest pain, shortness of breath, or peripheral edema. No lightheadedness, headaches, syncope, orthopnea, or PND.   History of available lipids: Peak Tchol 332 10/24/2018. Peak LDL 236 10/18/2017.    Past Medical History:  Diagnosis Date   Alcohol abuse    Allergy    Anxiety Dx 2006   Benign positional vertigo    Bipolar 2 disorder [facility]) Dx 2008   [facility] 2011-2017   Elevated cholesterol    Heart murmur    Dx at age of 39   Hx of dysmenorrhea 07/20/2012   Seasonale prescribed for severe dysmenorrhea and heavy bleeding with clotting; on OCP, menses lasts 4 days with light bleeding.    Hyperlipidemia    Dx at age of 39   Hypertension Dx 2009   Post-operative nausea and vomiting    PTSD (post-traumatic stress disorder) Dx 2008   Sleep apnea  mouth piece   Substance abuse ([place])    No alcohol since 2008    Past Surgical History:  Procedure Laterality Date   APPENDECTOMY  2012   ENDOMETRIAL ABLATION  02/2014    TUBAL LIGATION  02/2014     Current Medications: Current Outpatient Medications on File Prior to Visit  Medication Sig   lamoTRIgine (LAMICTAL) 200 MG tablet Take 1.5 tablets (300 mg total) by mouth daily.   lisinopril (ZESTRIL) 2.5 MG tablet TAKE 1 TABLET (2.5 MG TOTAL) BY MOUTH DAILY. DO NOT TAKE IF  BLOOD PRESSURE IS LESS THAN 130/80.   LYSINE PO Take 1 tablet by mouth daily.   mirtazapine (REMERON) 45 MG tablet Take 1 tablet (45 mg total) by mouth at bedtime.   Multiple Vitamin (MULTIVITAMIN) tablet Take 1 tablet by mouth daily.   Omega-3 Fatty Acids (OMEGA 3 PO) Take by mouth.   Red Yeast Rice Extract (RED YEAST RICE PO) Take 1 tablet by mouth daily.   ziprasidone (GEODON) 80 MG capsule TAKE 1 CAPSULE BY MOUTH EVERY DAY   No current facility-administered medications on file prior to visit.     Allergies:   Azithromycin, Codeine, Food [alpha-gal], Other, Pseudoephedrine, Sudafed [pseudoephedrine hcl], Zoloft [sertraline hcl], Penicillins, and Sulfa antibiotics   Social History   Tobacco Use   Smoking status: Former    Types: Cigarettes    Quit date: 11/14/2001    Years since quitting: 20.9   Smokeless tobacco: Never  Vaping Use   Vaping Use: Never used  Substance Use Topics   Alcohol use: No    Alcohol/week: 0.0 standard drinks of alcohol    Comment: In Recovery  since 2006   Drug use: No    Comment: sobriety from benzos 2008    Family History: family history includes ADD / ADHD in her daughter; Anxiety disorder in her daughter and father; Bipolar disorder in her paternal grandmother; Cancer (age of onset: 30) in her father; Cancer (age of onset: 22) in her maternal grandmother; Chronic fatigue in her mother; Colon polyps in her father; Depression in her daughter and father; Heart disease in her paternal grandmother; Heart disease (age of onset: 12) in her father; Hyperlipidemia in her father and paternal grandmother; Hypertension in her father, maternal grandfather, maternal grandmother, and paternal grandmother; Hypothyroidism in her maternal grandmother. There is no history of Colon cancer, Esophageal cancer, Rectal cancer, or Stomach cancer.  all of her family has a heart murmur. Father is 55, diagnosed with high cholesterol in his 33s, has been on a statin for a long time.  No MI. Has HTN. Paternal Grandmother had very high cholesterol, angina, placed on a statin-type medication in the late 1970s-early 1980s. Died of a stroke, had multiple mini strokes. Sister doesn't have any known issues.  ROS:   Please see the history of present illness. All other systems are reviewed and negative.     EKGs/Labs/Other Studies Reviewed:    The following studies were reviewed today:  01/10/2020: Calcium Score FINDINGS: Non-cardiac: No significant non cardiac findings on limited lung and soft tissue windows. See separate report from [facility].   Ascending Aorta: 3.6 cm maximal diameter.   Pericardium: Normal   IMPRESSION: Coronary calcium score of 0. This suggests low risk for future cardiac events.  EKG:  EKG is personally reviewed.   10/12/2022: not performed today 12/09/2019: NSR  Recent Labs: No results found for requested labs within last 365 days.  Recent Lipid Panel    Component Value  Date/Time   CHOL 279 (H) 11/01/2019 0835   TRIG 72 11/01/2019 0835   HDL 86 11/01/2019 0835   CHOLHDL 3.2 11/01/2019 0835   CHOLHDL 5.0 08/10/2016 0858   VLDL 22 08/10/2016 0858   LDLCALC 182 (H) 11/01/2019 0835    Physical Exam:    VS:  BP 132/70 (BP Location: Left Arm, Patient Position: Sitting, Cuff Size: Normal)   Pulse 82   Ht 5' 4.75" (1.645 m)   Wt 164 lb (74.4 kg)   BMI 27.50 kg/m     Wt Readings from Last 3 Encounters:  10/12/22 164 lb (74.4 kg)  03/06/20 145 lb 9.6 oz (66 kg)  12/09/19 143 lb 9.6 oz (65.1 kg)    GEN: Well nourished, well developed in no acute distress HEENT: Normal, moist mucous membranes NECK: No JVD CARDIAC: regular rhythm, normal S1 and S2, no rubs or gallops. 1/6 soft systolic murmur. VASCULAR: Radial and DP pulses 2+ bilaterally. No carotid bruits RESPIRATORY:  Clear to auscultation without rales, wheezing or rhonchi  ABDOMEN: Soft, non-tender, non-distended MUSCULOSKELETAL:  Ambulates independently SKIN: Warm  and dry, no edema NEUROLOGIC:  Alert and oriented x 3. No focal neuro deficits noted. PSYCHIATRIC:  Normal affect    ASSESSMENT:    1. Pure hypercholesterolemia   2. Family history of heart disease   3. Cardiac risk counseling   4. Counseling on health promotion and disease prevention    PLAN:    Hypercholesterolemia: peak is very elevated, though recently somewhat improved -has family history of high cholesterol, MI, strokes in her family -most recent LDL 182, peak 236. -it is likely that prior psychiatric medications worsened her cholesterol profile, she is now normal weight with excellent diet and activity level. Despite this, her LDL is 182. This strongly suggests underlying component of genetic lipid disorder, including possible heterozygous familial hypercholesterolemia. -calcium score in 2021 was zero. She would like to repeat this, ordered. -She is well educated after her own research regarding lipids and risks. After her father's experience, she declines to consider a trial of statin.   Cardiac risk counseling and prevention recommendations: She has excellent diet and lifestyle habits. We reviewed the AHA guidelines, below: -recommend heart healthy/Mediterranean diet, with whole grains, fruits, vegetable, fish, lean meats, nuts, and olive oil. Limit salt. -recommend moderate walking, 3-5 times/week for 30-50 minutes each session. Aim for at least 150 minutes.week. Goal should be pace of 3 miles/hours, or walking 1.5 miles in 30 minutes -recommend avoidance of tobacco products. Avoid excess alcohol.  ASCVD risk score: The 10-year ASCVD risk score (Arnett DK, et al., 2019) is: 2.4%   Values used to calculate the score:     Age: 24 years     Sex: Female     Is Non-Hispanic African American: No     Diabetic: No     Tobacco smoker: No     Systolic Blood Pressure: 211 mmHg     Is BP treated: Yes     HDL Cholesterol: 86 mg/dL     Total Cholesterol: 279 mg/dL    Plan for follow  up: TBD based on results of testing.  Buford Dresser, MD, PhD [place]  [facility]    Medication Adjustments/Labs and Tests Ordered: Current medicines are reviewed at length with the patient today.  Concerns regarding medicines are outlined above.  Orders Placed This Encounter  Procedures   CT CARDIAC SCORING (SELF PAY ONLY)   EKG 12-Lead   No orders of the defined types  were placed in this encounter.  Patient Instructions  Medication Instructions:  Your physician recommends that you continue on your current medications as directed. Please refer to the Current Medication list given to you today.  *If you need a refill on your cardiac medications before your next appointment, please call your pharmacy*  Testing/Procedures:  Coronary Calcium Scan  A coronary calcium scan is an imaging test used to look for deposits of plaque in the inner lining of the blood vessels of the heart (coronary arteries). Plaque is made up of calcium, protein, and fatty substances. These deposits of plaque can partly clog and narrow the coronary arteries without producing any symptoms or warning signs. This puts a person at risk for a heart attack. A coronary calcium scan is performed using a computed tomography (CT) scanner machine without using a dye (contrast). This test is recommended for people who are at moderate risk for heart disease. The test can find plaque deposits before symptoms develop. Tell a health care provider about: Any allergies you have. All medicines you are taking, including vitamins, herbs, eye drops, creams, and over-the-counter medicines. Any problems you or family members have had with anesthetic medicines. Any bleeding problems you have. Any surgeries you have had. Any medical conditions you have. Whether you are pregnant or may be pregnant. What are the risks? Generally, this is a safe procedure. However, problems may occur, including: Harm to a pregnant woman  and her unborn baby. This test involves the use of radiation. Radiation exposure can be dangerous to a pregnant woman and her unborn baby. If you are pregnant or think you may be pregnant, you should not have this procedure done. A slight increase in the risk of cancer. This is because of the radiation involved in the test. The amount of radiation from one test is similar to the amount of radiation you are naturally exposed to over one year. What happens before the procedure? Ask your health care provider for any specific instructions on how to prepare for this procedure. You may be asked to avoid products that contain caffeine, tobacco, or nicotine for 4 hours before the procedure. What happens during the procedure?  You will undress and remove any jewelry from your neck or chest. You may need to remove hearing aides and dentures. Women may need to remove their bras. You will put on a hospital gown. Sticky electrodes will be placed on your chest. The electrodes will be connected to an electrocardiogram (ECG) machine to record a tracing of the electrical activity of your heart. You will lie down on your back on a curved bed that is attached to the [place]. You may be given medicine to slow down your heart rate so that clear pictures can be created. You will be moved into the CT scanner, and the CT scanner will take pictures of your heart. During this time, you will be asked to lie still and hold your breath for 10-20 seconds at a time while each picture of your heart is being taken. The procedure may vary among health care providers and hospitals. What can I expect after the procedure? You can return to your normal activities. It is up to you to get the results of your procedure. Ask your health care provider, or the department that is doing the procedure, when your results will be ready. Summary A coronary calcium scan is an imaging test used to look for deposits of plaque in the inner lining of  the blood  vessels of the heart. Plaque is made up of calcium, protein, and fatty substances. A coronary calcium scan is performed using a CT scanner machine without contrast. Generally, this is a safe procedure. Tell your health care provider if you are pregnant or may be pregnant. Ask your health care provider for any specific instructions on how to prepare for this procedure. You can return to your normal activities after the scan is done. This information is not intended to replace advice given to you by your health care provider. Make sure you discuss any questions you have with your health care provider. Document Revised: 10/10/2021 Document Reviewed: 10/10/2021 Elsevier Patient Education  [place]: At [facility], you and your health needs are our priority.  As part of our continuing mission to provide you with exceptional heart care, we have created designated Provider Care Teams.  These Care Teams include your primary Cardiologist (physician) and Advanced Practice Providers (APPs -  Physician Assistants and Nurse Practitioners) who all work together to provide you with the care you need, when you need it.  We recommend signing up for the patient portal called "MyChart".  Sign up information is provided on this After Visit Summary.  MyChart is used to connect with patients for Virtual Visits (Telemedicine).  Patients are able to view lab/test results, encounter notes, upcoming appointments, etc.  Non-urgent messages can be sent to your provider as well.   To learn more about what you can do with MyChart, go to [email or web address].    Your next appointment:   We will call you with your results and let you know about a follow-up appointment   Other Instructions  Recent data, summarized from [place] from a study from the [facility]:  https://wilkins.info/  "Researchers at the [facility] set out to answer this question by comparing statins to supplements in a clinical trial. They tracked the outcomes of 190 adults, ages 52 to 67. Some participants were given a 5 mg daily dose of rosuvastatin, a statin that is sold under the brand name Crestor for 28 days. Others were given supplements, including fish oil, cinnamon, garlic, turmeric, plant sterols or red yeast rice for the same period."  "What we found was that rosuvastatin lowered LDL cholesterol by almost 38% and that was vastly superior to placebo and any of the six supplements studied in the trial," study Despina Hidden, M.D. of the [facility], [place] told NPR. He says this level of reduction is enough to lower the risk of heart attacks and strokes. The findings are published in the Journal of the [employer] of Cardiology.  "Oftentimes these supplements are marketed as 'natural ways' to lower your cholesterol," says Laffin. But he says none of the dietary supplements demonstrated any significant decrease in LDL cholesterol compared with a placebo. LDL cholesterol is considered the 'bad cholesterol' because it can contribute to plaque build-up in the artery walls - which can narrow the arteries, and set the stage for heart attacks and strokes"   Important Information About Sugar          I,Mitra Faeizi,acting as a scribe for Buford Dresser, MD.,have documented all relevant documentation on the behalf of Buford Dresser, MD,as directed by  Buford Dresser, MD while in the presence of Buford Dresser, MD.   I, Buford Dresser, MD, have reviewed all documentation for this visit. The documentation on 12/18/22 for the exam, diagnosis, procedures, and orders are all  accurate and complete.   Signed, Buford Dresser, MD PhD 10/12/2022    [place]

## 2022-10-12 NOTE — Telephone Encounter (Signed)
Left message for patient to call and discuss scheduling the Calcium scoring ordered by Dr. Harrell Gave

## 2022-10-12 NOTE — Patient Instructions (Addendum)
Medication Instructions:  Your physician recommends that you continue on your current medications as directed. Please refer to the Current Medication list given to you today.  *If you need a refill on your cardiac medications before your next appointment, please call your pharmacy*  Testing/Procedures:  Coronary Calcium Scan  A coronary calcium scan is an imaging test used to look for deposits of plaque in the inner lining of the blood vessels of the heart (coronary arteries). Plaque is made up of calcium, protein, and fatty substances. These deposits of plaque can partly clog and narrow the coronary arteries without producing any symptoms or warning signs. This puts a person at risk for a heart attack. A coronary calcium scan is performed using a computed tomography (CT) scanner machine without using a dye (contrast). This test is recommended for people who are at moderate risk for heart disease. The test can find plaque deposits before symptoms develop. Tell a health care provider about: Any allergies you have. All medicines you are taking, including vitamins, herbs, eye drops, creams, and over-the-counter medicines. Any problems you or family members have had with anesthetic medicines. Any bleeding problems you have. Any surgeries you have had. Any medical conditions you have. Whether you are pregnant or may be pregnant. What are the risks? Generally, this is a safe procedure. However, problems may occur, including: Harm to a pregnant woman and her unborn baby. This test involves the use of radiation. Radiation exposure can be dangerous to a pregnant woman and her unborn baby. If you are pregnant or think you may be pregnant, you should not have this procedure done. A slight increase in the risk of cancer. This is because of the radiation involved in the test. The amount of radiation from one test is similar to the amount of radiation you are naturally exposed to over one year. What  happens before the procedure? Ask your health care provider for any specific instructions on how to prepare for this procedure. You may be asked to avoid products that contain caffeine, tobacco, or nicotine for 4 hours before the procedure. What happens during the procedure?  You will undress and remove any jewelry from your neck or chest. You may need to remove hearing aides and dentures. Women may need to remove their bras. You will put on a hospital gown. Sticky electrodes will be placed on your chest. The electrodes will be connected to an electrocardiogram (ECG) machine to record a tracing of the electrical activity of your heart. You will lie down on your back on a curved bed that is attached to the [place]. You may be given medicine to slow down your heart rate so that clear pictures can be created. You will be moved into the CT scanner, and the CT scanner will take pictures of your heart. During this time, you will be asked to lie still and hold your breath for 10-20 seconds at a time while each picture of your heart is being taken. The procedure may vary among health care providers and hospitals. What can I expect after the procedure? You can return to your normal activities. It is up to you to get the results of your procedure. Ask your health care provider, or the department that is doing the procedure, when your results will be ready. Summary A coronary calcium scan is an imaging test used to look for deposits of plaque in the inner lining of the blood vessels of the heart. Plaque is made up of calcium,  protein, and fatty substances. A coronary calcium scan is performed using a CT scanner machine without contrast. Generally, this is a safe procedure. Tell your health care provider if you are pregnant or may be pregnant. Ask your health care provider for any specific instructions on how to prepare for this procedure. You can return to your normal activities after the scan is  done. This information is not intended to replace advice given to you by your health care provider. Make sure you discuss any questions you have with your health care provider. Document Revised: 10/10/2021 Document Reviewed: 10/10/2021 Elsevier Patient Education  [place]: At [facility], you and your health needs are our priority.  As part of our continuing mission to provide you with exceptional heart care, we have created designated Provider Care Teams.  These Care Teams include your primary Cardiologist (physician) and Advanced Practice Providers (APPs -  Physician Assistants and Nurse Practitioners) who all work together to provide you with the care you need, when you need it.  We recommend signing up for the patient portal called "MyChart".  Sign up information is provided on this After Visit Summary.  MyChart is used to connect with patients for Virtual Visits (Telemedicine).  Patients are able to view lab/test results, encounter notes, upcoming appointments, etc.  Non-urgent messages can be sent to your provider as well.   To learn more about what you can do with MyChart, go to [email or web address].    Your next appointment:   We will call you with your results and let you know about a follow-up appointment   Other Instructions  Recent data, summarized from [place] from a study from the [facility] [place]:  https://wilkins.info/  "Researchers at the [facility] set out to answer this question by comparing statins to supplements in a clinical trial. They tracked the outcomes of 190 adults, ages 40 to 27. Some participants were given a 5 mg daily dose of rosuvastatin, a statin that is sold under the brand name Crestor for 28 days. Others were given supplements, including fish oil, cinnamon, garlic, turmeric, plant sterols or red  yeast rice for the same period."  "What we found was that rosuvastatin lowered LDL cholesterol by almost 38% and that was vastly superior to placebo and any of the six supplements studied in the trial," study Despina Hidden, M.D. of the [facility], [place] told NPR. He says this level of reduction is enough to lower the risk of heart attacks and strokes. The findings are published in the Journal of the [employer] of Cardiology.  "Oftentimes these supplements are marketed as 'natural ways' to lower your cholesterol," says Laffin. But he says none of the dietary supplements demonstrated any significant decrease in LDL cholesterol compared with a placebo. LDL cholesterol is considered the 'bad cholesterol' because it can contribute to plaque build-up in the artery walls - which can narrow the arteries, and set the stage for heart attacks and strokes"   Important Information About Sugar

## 2022-10-13 ENCOUNTER — Encounter (HOSPITAL_BASED_OUTPATIENT_CLINIC_OR_DEPARTMENT_OTHER): Payer: Self-pay

## 2022-10-13 ENCOUNTER — Ambulatory Visit (HOSPITAL_BASED_OUTPATIENT_CLINIC_OR_DEPARTMENT_OTHER)
Admission: RE | Admit: 2022-10-13 | Discharge: 2022-10-13 | Disposition: A | Discharge status: Home / Self Care | Payer: Managed Care, Other (non HMO) | Source: Ambulatory Visit | Attending: Cardiology | Admitting: Cardiology

## 2022-10-13 DIAGNOSIS — Z8249 Family history of ischemic heart disease and other diseases of the circulatory system: Secondary | ICD-10-CM | POA: Insufficient documentation

## 2022-10-13 DIAGNOSIS — E7800 Pure hypercholesterolemia, unspecified: Secondary | ICD-10-CM | POA: Insufficient documentation

## 2022-10-20 ENCOUNTER — Encounter (HOSPITAL_BASED_OUTPATIENT_CLINIC_OR_DEPARTMENT_OTHER): Payer: Self-pay

## 2022-10-21 NOTE — Telephone Encounter (Signed)
Calcium score not read from 11/30--can you follow up? Thank you

## 2022-10-21 NOTE — Telephone Encounter (Signed)
Please advise 

## 2022-10-24 ENCOUNTER — Encounter (HOSPITAL_BASED_OUTPATIENT_CLINIC_OR_DEPARTMENT_OTHER): Payer: Self-pay

## 2022-12-18 ENCOUNTER — Encounter (HOSPITAL_BASED_OUTPATIENT_CLINIC_OR_DEPARTMENT_OTHER): Payer: Self-pay | Admitting: Cardiology

## 2023-01-05 ENCOUNTER — Telehealth (HOSPITAL_BASED_OUTPATIENT_CLINIC_OR_DEPARTMENT_OTHER): Payer: 59 | Admitting: Psychiatry

## 2023-01-05 DIAGNOSIS — F4310 Post-traumatic stress disorder, unspecified: Secondary | ICD-10-CM | POA: Diagnosis not present

## 2023-01-05 DIAGNOSIS — F3181 Bipolar II disorder: Secondary | ICD-10-CM

## 2023-01-05 DIAGNOSIS — F411 Generalized anxiety disorder: Secondary | ICD-10-CM

## 2023-01-05 MED ORDER — ZIPRASIDONE HCL 80 MG PO CAPS: ORAL_CAPSULE | ORAL | 1 refills | Status: DC

## 2023-01-05 MED ORDER — MIRTAZAPINE 45 MG PO TABS: 45.0000 mg | ORAL_TABLET | Freq: Every day | ORAL | 1 refills | Status: DC

## 2023-01-05 MED ORDER — LAMOTRIGINE 200 MG PO TABS: 300.0000 mg | ORAL_TABLET | Freq: Every day | ORAL | 1 refills | Status: DC

## 2023-01-05 NOTE — Progress Notes (Signed)
Virtual Visit via Video Note  I connected with Nancy Bradshaw on 01/05/23 at  8:00 AM EST by  a video enabled telemedicine application and verified that I am speaking with the correct person using two identifiers.  Location: Patient: Home Provider: office   I discussed the limitations of evaluation and management by telemedicine and the availability of in person appointments. The patient expressed understanding and agreed to proceed.  History of Present Illness: Nancy Bradshaw shares she is doing well. She has applied for a promotion at work and should have the decision by the end of the week. Regardless Nancy Bradshaw really likes her current job. Nancy Bradshaw has been on a few hiking trips but not as much as she would like. She plans to do more this year. She has been walking near her house and work. Nancy Bradshaw has had a few days where she felt dysthymic. It usually happens for a few days when the time changes happens. She doesn't have to do anything to make it better, as it usually resolves on its own. Now that the days are getting longer and she is feeling better. She denies hopelessness, anhedonia and isolation. She denies SI/HI. Randomly a couple of times a month she will wakes up at 3am for unknown reasons and couldn't fall back asleep. This has not happened in the last few week. There are some random days where she will have more energy than usual. She denies mood lability, impulsivity, grandiose thoughts, FOI and financial extravagance. Nancy Bradshaw wakes up every morning with anxiety but it passes once she gets started on her daily routine. It does not stop her from doing anything she needs to do. She has some situational anxiety but it feels appropriate to the situation. She engages in all her coping skills and it helps. She denies any PTSD symptoms in many years. Nancy Bradshaw feels like she is doing well and states her meds are working well. Her cholesterol continues to elevate. She is working with her PCP and  cardiologist and prefers to manage thru diet and exercise.    Observations/Objective: Psychiatric Specialty Exam: ROS  There were no vitals taken for this visit.There is no height or weight on file to calculate BMI.  General Appearance: Casual and Neat  Eye Contact:  Good  Speech:  Clear and Coherent and Normal Rate  Volume:  Normal  Mood:  Euthymic  Affect:  Full Range  Thought Process:  Coherent and Descriptions of Associations: Circumstantial  Orientation:  Full (Time, Place, and Person)  Thought Content:  Logical  Suicidal Thoughts:  No  Homicidal Thoughts:  No  Memory:  Immediate;   Good  Judgement:  Good  Insight:  Good  Psychomotor Activity:  Normal  Concentration:  Concentration: Good  Recall:  Good  Fund of Knowledge:  Good  Language:  Good  Akathisia:  No  Handed:  Right  AIMS (if indicated):     Assets:  Communication Skills Desire for Improvement Financial Resources/Insurance Housing Leisure Time Resilience Social Support Talents/Skills Transportation Vocational/Educational  ADL's:  Intact  Cognition:  WNL  Sleep:        Assessment and Plan:     01/05/2023    8:13 AM 07/14/2022    8:06 AM 01/20/2022    8:10 AM 08/12/2021    8:15 AM 02/18/2021    8:08 AM  Depression screen PHQ 2/9  Decreased Interest 0 0 0 0 0  Down, Depressed, Hopeless 0 0 0 0 0  PHQ - 2 Score 0 0  0 0 0    Flowsheet Row Video Visit from 01/05/2023 in [facility] Video Visit from 07/14/2022 in [facility] Video Visit from 01/20/2022 in [facility] No Risk No Risk No Risk          Pt is aware that these meds carry a teratogenic risk. Pt will discuss plan of action if she does or plans to become pregnant in the future.  Status of current problems: stable   Medication management with supportive therapy. Risks and benefits, side effects and  alternative treatment options discussed with patient. Pt was given an opportunity to ask questions about medication, illness, and treatment. All current psychiatric medications have been reviewed and discussed with the patient and adjusted as clinically appropriate.  Pt verbalized understanding and verbal consent obtained for treatment.  Meds:  1. Bipolar II disorder in full remission ([facility]) - lamoTRIgine (LAMICTAL) 200 MG tablet; Take 1.5 tablets (300 mg total) by mouth daily.  Dispense: 135 tablet; Refill: 1 - mirtazapine (REMERON) 45 MG tablet; Take 1 tablet (45 mg total) by mouth at bedtime.  Dispense: 90 tablet; Refill: 1 - ziprasidone (GEODON) 80 MG capsule; TAKE 1 CAPSULE BY MOUTH EVERY DAY  Dispense: 90 capsule; Refill: 1  2. GAD (generalized anxiety disorder) - mirtazapine (REMERON) 45 MG tablet; Take 1 tablet (45 mg total) by mouth at bedtime.  Dispense: 90 tablet; Refill: 1  3. PTSD (post-traumatic stress disorder) - mirtazapine (REMERON) 45 MG tablet; Take 1 tablet (45 mg total) by mouth at bedtime.  Dispense: 90 tablet; Refill: 1     Labs: Nancy Bradshaw had her annual physical with her PCP and I will request records.  She will have an EKG done later this year.   Therapy: brief supportive therapy provided. Discussed psychosocial stressors in detail.    Collaboration of Care: Other n/a  Patient/Guardian was advised Release of Information must be obtained prior to any record release in order to collaborate their care with an outside provider. Patient/Guardian was advised if they have not already done so to contact the registration department to sign all necessary forms in order for [place] to release information regarding their care.   Consent: Patient/Guardian gives verbal consent for treatment and assignment of benefits for services provided during this visit. Patient/Guardian expressed understanding and agreed to proceed.    Follow Up Instructions: Follow up in 6 months or sooner if  needed    I discussed the assessment and treatment plan with the patient. The patient was provided an opportunity to ask questions and all were answered. The patient agreed with the plan and demonstrated an understanding of the instructions.   The patient was advised to call back or seek an in-person evaluation if the symptoms worsen or if the condition fails to improve as anticipated.  I provided 16 minutes of non-face-to-face time during this encounter.   Charlcie Cradle, MD

## 2023-03-20 ENCOUNTER — Encounter (HOSPITAL_COMMUNITY): Payer: Self-pay

## 2023-04-02 ENCOUNTER — Ambulatory Visit (HOSPITAL_COMMUNITY)
Admission: EM | Admit: 2023-04-02 | Discharge: 2023-04-02 | Disposition: A | Discharge status: Home / Self Care | Payer: Managed Care, Other (non HMO) | Attending: Urgent Care | Admitting: Urgent Care

## 2023-04-02 ENCOUNTER — Encounter (HOSPITAL_COMMUNITY): Payer: Self-pay

## 2023-04-02 DIAGNOSIS — W57XXXA Bitten or stung by nonvenomous insect and other nonvenomous arthropods, initial encounter: Secondary | ICD-10-CM

## 2023-04-02 DIAGNOSIS — S30861A Insect bite (nonvenomous) of abdominal wall, initial encounter: Secondary | ICD-10-CM | POA: Diagnosis not present

## 2023-04-02 MED ORDER — TRIAMCINOLONE ACETONIDE 0.1 % EX CREA: 1.0000 | TOPICAL_CREAM | Freq: Two times a day (BID) | CUTANEOUS | 0 refills | Status: AC

## 2023-04-02 MED ORDER — DOXYCYCLINE HYCLATE 100 MG PO CAPS
200.0000 mg | ORAL_CAPSULE | Freq: Once | ORAL | 0 refills | Status: AC
Start: 2023-04-02 — End: 2023-04-02

## 2023-04-02 NOTE — ED Provider Notes (Signed)
[facility]   MRN: [id number] DOB: 1966/06/10  Subjective:   Nancy Bradshaw is a 57 y.o. female presenting for concern for a tick bite that was last done approximately 1 day.  This was from going out and hiking the day prior.  She did remove it.  Has not had fever, joint pains, cough, malaise, headaches.  Has a history of [place] spotted fever in 2021 and prefers to use antibiotic prophylaxis.  No current facility-administered medications for this encounter.  Current Outpatient Medications:    lamoTRIgine (LAMICTAL) 200 MG tablet, Take 1.5 tablets (300 mg total) by mouth daily., Disp: 135 tablet, Rfl: 1   lisinopril (ZESTRIL) 2.5 MG tablet, TAKE 1 TABLET (2.5 MG TOTAL) BY MOUTH DAILY. DO NOT TAKE IF BLOOD PRESSURE IS LESS THAN 130/80., Disp: 90 tablet, Rfl: 0   LYSINE PO, Take 1 tablet by mouth daily., Disp: , Rfl:    mirtazapine (REMERON) 45 MG tablet, Take 1 tablet (45 mg total) by mouth at bedtime., Disp: 90 tablet, Rfl: 1   Multiple Vitamin (MULTIVITAMIN) tablet, Take 1 tablet by mouth daily., Disp: , Rfl:    Omega-3 Fatty Acids (OMEGA 3 PO), Take by mouth., Disp: , Rfl:    Red Yeast Rice Extract (RED YEAST RICE PO), Take 1 tablet by mouth daily., Disp: , Rfl:    ziprasidone (GEODON) 80 MG capsule, TAKE 1 CAPSULE BY MOUTH EVERY DAY, Disp: 90 capsule, Rfl: 1   Allergies  Allergen Reactions   Azithromycin Nausea And Vomiting   Codeine Nausea And Vomiting   Food [Alpha-Gal] Nausea And Vomiting    Avoids all Red meat   Other     Red Meat Pt prefers not to have pain medication if not necessary- recovering alcoholic.    Pseudoephedrine Other (See Comments)   Sudafed [Pseudoephedrine Hcl]     "Creepy crawly skin"--if taken consistently   Zoloft [Sertraline Hcl]     Due to bipolar disorder-makes her manic.    Penicillins Rash    Has patient had a PCN reaction causing immediate rash, facial/tongue/throat swelling, SOB or lightheadedness with  hypotension: No Has patient had a PCN reaction causing severe rash involving mucus membranes or skin necrosis: No Has patient had a PCN reaction that required hospitalization: No Has patient had a PCN reaction occurring within the last 10 years: No If all of the above answers are "NO", then may proceed with Cephalosporin use.    Sulfa Antibiotics Rash    Past Medical History:  Diagnosis Date   Alcohol abuse    Allergy    Anxiety Dx 2006   Benign positional vertigo    Bipolar 2 disorder [facility]) Dx 2008   [facility] 2011-2017   Elevated cholesterol    Heart murmur    Dx at age of 46   Hx of dysmenorrhea 07/20/2012   Seasonale prescribed for severe dysmenorrhea and heavy bleeding with clotting; on OCP, menses lasts 4 days with light bleeding.    Hyperlipidemia    Dx at age of 62   Hypertension Dx 2009   Post-operative nausea and vomiting    PTSD (post-traumatic stress disorder) Dx 2008   Sleep apnea    mouth piece   Substance abuse (HCC)    No alcohol since 2008     Past Surgical History:  Procedure Laterality Date   APPENDECTOMY  2012   ENDOMETRIAL ABLATION  02/2014    TUBAL LIGATION  02/2014     Family History  Problem Relation Age of Onset   Hypertension Father    Hyperlipidemia Father    Cancer Father 44       CLL   Anxiety disorder Father    Depression Father    Heart disease Father 47       CHF   Colon polyps Father    Hypertension Maternal Grandmother    Hypothyroidism Maternal Grandmother        and great aunts    Cancer Maternal Grandmother 34       on spinal cord, rare    Hypertension Maternal Grandfather    Bipolar disorder Paternal Grandmother    Hypertension Paternal Grandmother    Hyperlipidemia Paternal Grandmother    Heart disease Paternal Grandmother    Chronic fatigue Mother    ADD / ADHD Daughter    Anxiety disorder Daughter    Depression Daughter    Colon cancer Neg Hx    Esophageal cancer Neg Hx    Rectal cancer Neg Hx    Stomach  cancer Neg Hx     Social History   Tobacco Use   Smoking status: Former    Types: Cigarettes    Quit date: 11/14/2001    Years since quitting: 21.3   Smokeless tobacco: Never  Vaping Use   Vaping Use: Never used  Substance Use Topics   Alcohol use: No    Alcohol/week: 0.0 standard drinks of alcohol    Comment: In Recovery  since 2006   Drug use: No    Comment: sobriety from benzos 2008    ROS   Objective:   Vitals: BP (!) 143/83 (BP Location: Left Arm)   Pulse 69   Temp 98 F (36.7 C) (Oral)   Resp 16   Ht 5' 4.75" (1.645 m)   Wt 165 lb (74.8 kg)   SpO2 97%   BMI 27.67 kg/m   Physical Exam Constitutional:      General: She is not in acute distress.    Appearance: Normal appearance. She is well-developed. She is not ill-appearing, toxic-appearing or diaphoretic.  HENT:     Head: Normocephalic and atraumatic.     Nose: Nose normal.     Mouth/Throat:     Mouth: Mucous membranes are moist.  Eyes:     General: No scleral icterus.       Right eye: No discharge.        Left eye: No discharge.     Extraocular Movements: Extraocular movements intact.  Cardiovascular:     Rate and Rhythm: Normal rate.  Pulmonary:     Effort: Pulmonary effort is normal.  Abdominal:    Skin:    General: Skin is warm and dry.  Neurological:     General: No focal deficit present.     Mental Status: She is alert and oriented to person, place, and time.  Psychiatric:        Mood and Affect: Mood normal.        Behavior: Behavior normal.     Assessment and Plan :   PDMP not reviewed this encounter.  1. Tick bite of abdominal wall, initial encounter    Provided her with doxycycline 200 mg for one-time dose as antibiotic prophylaxis for the tick bite.  Recommended topical steroid locally as tick bites can easily lead to irritant dermatitis.  Follow-up as needed.   Wallis Bamberg, [place] 04/03/23 [phone]

## 2023-04-02 NOTE — ED Triage Notes (Signed)
Patient here today after having a tick bite on her right side abdomen. She went hiking Friday and Saturday and noticed it this morning. She was still in the wilderness and pulled the tick off. She is not sure if the head is still embedded. She has a h/o RMSF in 2021.

## 2023-05-26 ENCOUNTER — Encounter: Payer: Self-pay | Admitting: Adult Health

## 2023-05-26 ENCOUNTER — Ambulatory Visit (INDEPENDENT_AMBULATORY_CARE_PROVIDER_SITE_OTHER): Payer: 59 | Admitting: Adult Health

## 2023-05-26 VITALS — BP 113/81 | HR 72 | Ht 65.0 in | Wt 155.0 lb

## 2023-05-26 DIAGNOSIS — F3181 Bipolar II disorder: Secondary | ICD-10-CM | POA: Diagnosis not present

## 2023-05-26 DIAGNOSIS — F411 Generalized anxiety disorder: Secondary | ICD-10-CM | POA: Diagnosis not present

## 2023-05-26 DIAGNOSIS — F4310 Post-traumatic stress disorder, unspecified: Secondary | ICD-10-CM

## 2023-05-26 MED ORDER — LAMOTRIGINE 200 MG PO TABS: 300.0000 mg | ORAL_TABLET | Freq: Every day | ORAL | 1 refills | Status: DC

## 2023-05-26 MED ORDER — ZIPRASIDONE HCL 80 MG PO CAPS: ORAL_CAPSULE | ORAL | 1 refills | Status: DC

## 2023-05-26 MED ORDER — MIRTAZAPINE 45 MG PO TABS: 45.0000 mg | ORAL_TABLET | Freq: Every day | ORAL | 1 refills | Status: DC

## 2023-05-26 NOTE — Progress Notes (Signed)
[facility] MD/PA/NP Initial Note  05/26/2023 8:41 AM Mariadelaluz Pavlicek  MRN:  [id number]  Chief Complaint:   HPI:   Patient seen today for initial psychiatric evaluation.   Previously followed by Dr. Michae Kava.  Describes mood today as - Mood symptoms - denies depression - "none in a really long time". Reports some dysthymia over the winter months. Denies irritability. Reports anxiety at times, but feels it is manageable. Denies panic attacks. Reports some worry - daughter and mother. Denies rumination and over thinking. Feels like mood is consistent. Stating "I feel stable". Feels like current medications work well. Stable interest and motivation. Taking medications as prescribed.  Energy levels stable. Active, has a regular exercise routine.  Working with a Psychologist, educational. Enjoys some usual interests and activities. Lives with 54 year old mother. Has a daughter in college - 1. Spending time with family. Appetite adequate. Weight loss - intentional. Reports recent issues with sleep waking up during the night.   Focus and concentration stable. Completing tasks. Managing aspects of household. 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 Denies SI or HI.  Denies AH or VH. Denies self harm. Denies substance use. Recovery for the past 16 years.  Previous medication trials: Trazadone  Visit Diagnosis:    ICD-10-CM   1. Bipolar II disorder in full remission (HCC)  F31.81     2. GAD (generalized anxiety disorder)  F41.1     3. PTSD (post-traumatic stress disorder)  F43.10       Past Psychiatric History: Reports psychiatric hospitalization.   Past Medical History:  Past Medical History:  Diagnosis Date   Alcohol abuse    Allergy    Anxiety Dx 2006   Benign positional vertigo    Bipolar 2 disorder [facility]) Dx 2008   [facility] 2011-2017   Elevated cholesterol    Heart murmur    Dx at age of 75   Hx of dysmenorrhea 07/20/2012   Seasonale prescribed for severe  dysmenorrhea and heavy bleeding with clotting; on OCP, menses lasts 4 days with light bleeding.    Hyperlipidemia    Dx at age of 33   Hypertension Dx 2009   Post-operative nausea and vomiting    PTSD (post-traumatic stress disorder) Dx 2008   Sleep apnea    mouth piece   Substance abuse (HCC)    No alcohol since 2008    Past Surgical History:  Procedure Laterality Date   APPENDECTOMY  2012   ENDOMETRIAL ABLATION  02/2014    TUBAL LIGATION  02/2014     Family Psychiatric History: Family history of mental illness.   Family History:  Family History  Problem Relation Age of Onset   Hypertension Father    Hyperlipidemia Father    Cancer Father 30       CLL   Anxiety disorder Father    Depression Father    Heart disease Father 70       CHF   Colon polyps Father    Hypertension Maternal Grandmother    Hypothyroidism Maternal Grandmother        and great aunts    Cancer Maternal Grandmother 68       on spinal cord, rare    Hypertension Maternal Grandfather    Bipolar disorder Paternal Grandmother    Hypertension Paternal Grandmother    Hyperlipidemia Paternal Grandmother    Heart disease Paternal Grandmother    Chronic fatigue Mother    ADD / ADHD Daughter  Anxiety disorder Daughter    Depression Daughter    Colon cancer Neg Hx    Esophageal cancer Neg Hx    Rectal cancer Neg Hx    Stomach cancer Neg Hx     Social History:  Social History   Socioeconomic History   Marital status: Divorced    Spouse name: Not on file   Number of children: 1    Years of education: grad schoo   Highest education level: Not on file  Occupational History   Not on file  Tobacco Use   Smoking status: Former    Current packs/day: 0.00    Types: Cigarettes    Quit date: 11/14/2001    Years since quitting: 21.5   Smokeless tobacco: Never  Vaping Use   Vaping status: Never Used  Substance and Sexual Activity   Alcohol use: No    Alcohol/week: 0.0 standard drinks of alcohol     Comment: In Recovery  since 2006   Drug use: No    Comment: sobriety from benzos 2008   Sexual activity: Not Currently    Birth control/protection: Surgical  Other Topics Concern   Not on file  Social History Narrative   Social Hx:   Current living situation: lives in [place] with mother and daughter   Born and Raised in [place] by both parents. 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   Kids: 1 daughter  (15yo)   Legal issues: denies   recovery since 2006.   Alcoholism age [age]; binge drinking; benzo abuse stopped in 2008. Pt goes to AA 3x/week      Drugs: none; previous marijuna use about 5 times in her life time      Tobacco: quit smoking in 2002; smoked x 25 years      Exercise: no formal exercise in 2018.     Social Determinants of Health   Financial Resource Strain: Not on file  Food Insecurity: Not on file  Transportation Needs: Not on file  Physical Activity: Not on file  Stress: Not on file  Social Connections: Not on file    Allergies:  Allergies  Allergen Reactions   Azithromycin Nausea And Vomiting   Codeine Nausea And Vomiting   Food [Alpha-Gal] Nausea And Vomiting    Avoids all Red meat   Other     Red Meat Pt prefers not to have pain medication if not necessary- recovering alcoholic.    Pseudoephedrine Other (See Comments)   Sudafed [Pseudoephedrine Hcl]     "Creepy crawly skin"--if taken consistently   Zoloft [Sertraline Hcl]     Due to bipolar disorder-makes her manic.    Penicillins Rash    Has patient had a PCN reaction causing immediate rash, facial/tongue/throat swelling, SOB or lightheadedness with hypotension: No Has patient had a PCN reaction causing severe  rash involving mucus membranes or skin necrosis: No Has patient had a PCN reaction that required hospitalization: No Has patient had a PCN reaction occurring within the last 10 years: No If all of the above answers are "NO", then may proceed with Cephalosporin use.    Sulfa Antibiotics Rash    Metabolic Disorder Labs: Lab Results  Component  Value Date   HGBA1C 5.5 10/24/2018   MPG 100 08/10/2016   Lab Results  Component Value Date   PROLACTIN 11.4 10/18/2017   Lab Results  Component Value Date   CHOL 279 (H) 11/01/2019   TRIG 72 11/01/2019   HDL 86 11/01/2019   CHOLHDL 3.2 11/01/2019   VLDL 22 08/10/2016   LDLCALC 182 (H) 11/01/2019   LDLCALC 232 (H) 10/24/2018   Lab Results  Component Value Date   TSH 1.150 01/10/2019   TSH 2.010 10/24/2018    Therapeutic Level Labs: No results found for: "LITHIUM" No results found for: "VALPROATE" No results found for: "CBMZ"  Current Medications: Current Outpatient Medications  Medication Sig Dispense Refill   lamoTRIgine (LAMICTAL) 200 MG tablet Take 1.5 tablets (300 mg total) by mouth daily. 135 tablet 1   lisinopril (ZESTRIL) 2.5 MG tablet TAKE 1 TABLET (2.5 MG TOTAL) BY MOUTH DAILY. DO NOT TAKE IF BLOOD PRESSURE IS LESS THAN 130/80. 90 tablet 0   LYSINE PO Take 1 tablet by mouth daily.     mirtazapine (REMERON) 45 MG tablet Take 1 tablet (45 mg total) by mouth at bedtime. 90 tablet 1   Multiple Vitamin (MULTIVITAMIN) tablet Take 1 tablet by mouth daily.     Omega-3 Fatty Acids (OMEGA 3 PO) Take by mouth.     Red Yeast Rice Extract (RED YEAST RICE PO) Take 1 tablet by mouth daily.     triamcinolone cream (KENALOG) 0.1 % Apply 1 Application topically 2 (two) times daily. 30 g 0   ziprasidone (GEODON) 80 MG capsule TAKE 1 CAPSULE BY MOUTH EVERY DAY 90 capsule 1   No current facility-administered medications for this visit.    Medication Side Effects: none  Orders placed this visit:  No orders of the defined types were  placed in this encounter.   Psychiatric Specialty Exam:  Review of Systems  Musculoskeletal:  Negative for gait problem.  Neurological:  Negative for tremors.  Psychiatric/Behavioral:         Please refer to HPI    There were no vitals taken for this visit.There is no height or weight on file to calculate BMI.  General Appearance: Casual and Neat  Eye Contact:  Good  Speech:  Clear and Coherent and Normal Rate  Volume:  Normal  Mood:  Euthymic  Affect:  Appropriate and Congruent  Thought Process:  Coherent and Descriptions of Associations: Intact  Orientation:  Full (Time, Place, and Person)  Thought Content: Logical   Suicidal Thoughts:  No  Homicidal Thoughts:  No  Memory:  WNL  Judgement:  Good  Insight:  Good  Psychomotor Activity:  Normal  Concentration:  Concentration: Good and Attention Span: Good  Recall:  Good  Fund of Knowledge: Good  Language: Good  Assets:  Communication Skills Desire for Improvement Financial Resources/Insurance Housing Intimacy Leisure Time Physical Health Resilience Social Support Talents/Skills Transportation Vocational/Educational  ADL's:  Intact  Cognition: WNL  Prognosis:  Good   Screenings:  PHQ2-9    Flowsheet Row [facility] Video Visit from 07/14/2022 in [facility] Video Visit from 01/20/2022 in [facility] Video Visit from 08[facility]12/2021 in [facility] Video Visit from 02/18/2021 in [facility]  PHQ-2 Total Score 0 0 0 0 0      Flowsheet Row ED from 04/02/2023 in [facility] Video Visit from 01/05/2023  in [facility] Video Visit from 07/14/2022 in [facility]  C-SSRS RISK CATEGORY No Risk No Risk No  Risk       Receiving Psychotherapy: Yes   Treatment Plan/Recommendations:  Continue:  Lamictal 300mg  daily Geodon 80mg  daily Remeron 45mg  at hs  RTC 6 months  Discussed potential metabolic side effects associated with atypical antipsychotics, as well as potential risk for movement side effects. Advised pt to contact office if movement side effects occur.    Counseled patient regarding potential benefits, risks, and side effects of Lamictal to include potential risk of Stevens-Johnson syndrome. Advised patient to stop taking Lamictal and contact office immediately if rash develops and to seek urgent medical attention if rash is severe and/or spreading quickly.    Dorothyann Gibbs, NP

## 2023-05-31 ENCOUNTER — Telehealth: Payer: Self-pay | Admitting: Adult Health

## 2023-06-14 NOTE — Telephone Encounter (Signed)
error 

## 2023-06-22 ENCOUNTER — Telehealth (HOSPITAL_COMMUNITY): Payer: 59 | Admitting: Psychiatry

## 2023-07-19 ENCOUNTER — Ambulatory Visit
Admission: RE | Admit: 2023-07-19 | Discharge: 2023-07-19 | Disposition: A | Discharge status: Home / Self Care | Payer: Managed Care, Other (non HMO) | Source: Ambulatory Visit | Attending: Internal Medicine | Admitting: Internal Medicine

## 2023-07-19 VITALS — BP 130/65 | HR 62 | Temp 98.0°F | Resp 16

## 2023-07-19 DIAGNOSIS — L249 Irritant contact dermatitis, unspecified cause: Secondary | ICD-10-CM | POA: Diagnosis not present

## 2023-07-19 DIAGNOSIS — W57XXXA Bitten or stung by nonvenomous insect and other nonvenomous arthropods, initial encounter: Secondary | ICD-10-CM

## 2023-07-19 DIAGNOSIS — L299 Pruritus, unspecified: Secondary | ICD-10-CM | POA: Diagnosis not present

## 2023-07-19 MED ORDER — DOXYCYCLINE HYCLATE 100 MG PO CAPS: 200.0000 mg | ORAL_CAPSULE | Freq: Once | ORAL | 0 refills | Status: AC

## 2023-07-19 NOTE — ED Provider Notes (Signed)
[facility]RG[facility]  Note:  This document was prepared using Conservation officer, historic buildings and may include unintentional dictation errors.  MRN: [id number] DOB: [birth year]  Subjective:   Nancy Bradshaw is a 57 y.o. female presenting for prophylaxis for tick bite.  Patient removed a tick from the posterior left knee.  Believes it was there for 72 hours at least.  She thought it was a raised bump from a mosquito bite but she finally checked it last night and saw that it was a tick.  She has a history of alpha gal allergy, [place] spotted fever.  Would like to undergo antibiotic prophylaxis.  No fever, headaches, joint pains, rashes, nausea, vomiting, abdominal pain, throat pain, cough.  No current facility-administered medications for this encounter.  Current Outpatient Medications:    lamoTRIgine (LAMICTAL) 200 MG tablet, Take 1.5 tablets (300 mg total) by mouth daily., Disp: 135 tablet, Rfl: 1   lisinopril (ZESTRIL) 2.5 MG tablet, TAKE 1 TABLET (2.5 MG TOTAL) BY MOUTH DAILY. DO NOT TAKE IF BLOOD PRESSURE IS LESS THAN 130/80., Disp: 90 tablet, Rfl: 0   LYSINE PO, Take 1 tablet by mouth daily., Disp: , Rfl:    mirtazapine (REMERON) 45 MG tablet, Take 1 tablet (45 mg total) by mouth at bedtime., Disp: 90 tablet, Rfl: 1   Multiple Vitamin (MULTIVITAMIN) tablet, Take 1 tablet by mouth daily., Disp: , Rfl:    Omega-3 Fatty Acids (OMEGA 3 PO), Take by mouth., Disp: , Rfl:    Red Yeast Rice Extract (RED YEAST RICE PO), Take 1 tablet by mouth daily., Disp: , Rfl:    triamcinolone cream (KENALOG) 0.1 %, Apply 1 Application topically 2 (two) times daily., Disp: 30 g, Rfl: 0   ziprasidone (GEODON) 80 MG capsule, TAKE 1 CAPSULE BY MOUTH EVERY DAY, Disp: 90 capsule, Rfl: 1   Allergies  Allergen Reactions   Azithromycin Nausea And Vomiting   Codeine Nausea And Vomiting   Food [Alpha-Gal] Nausea And Vomiting    Avoids all Red meat   Other     Red Meat Pt prefers  not to have pain medication if not necessary- recovering alcoholic.    Pseudoephedrine Other (See Comments)   Sudafed [Pseudoephedrine Hcl]     "Creepy crawly skin"--if taken consistently   Zoloft [Sertraline Hcl]     Due to bipolar disorder-makes her manic.    Penicillins Rash    Has patient had a PCN reaction causing immediate rash, facial/tongue/throat swelling, SOB or lightheadedness with hypotension: No Has patient had a PCN reaction causing severe rash involving mucus membranes or skin necrosis: No Has patient had a PCN reaction that required hospitalization: No Has patient had a PCN reaction occurring within the last 10 years: No If all of the above answers are "NO", then may proceed with Cephalosporin use.    Sulfa Antibiotics Rash    Past Medical History:  Diagnosis Date   Alcohol abuse    Allergy    Anxiety Dx 2006   Benign positional vertigo    Bipolar 2 disorder [facility]) Dx 2008   [facility] 2011-2017   Elevated cholesterol    Heart murmur    Dx at age of 89   Hx of dysmenorrhea 07/20/2012   Seasonale prescribed for severe dysmenorrhea and heavy bleeding with clotting; on OCP, menses lasts 4 days with light bleeding.    Hyperlipidemia    Dx at age of 60   Hypertension Dx 2009   Post-operative nausea and  vomiting    PTSD (post-traumatic stress disorder) Dx 2008   Sleep apnea    mouth piece   Substance abuse (HCC)    No alcohol since 2008     Past Surgical History:  Procedure Laterality Date   APPENDECTOMY  2012   ENDOMETRIAL ABLATION  02/2014    TUBAL LIGATION  02/2014     Family History  Problem Relation Age of Onset   Hypertension Father    Hyperlipidemia Father    Cancer Father 41       CLL   Anxiety disorder Father    Depression Father    Heart disease Father 52       CHF   Colon polyps Father    Hypertension Maternal Grandmother    Hypothyroidism Maternal Grandmother        and great aunts    Cancer Maternal Grandmother 67       on spinal  cord, rare    Hypertension Maternal Grandfather    Bipolar disorder Paternal Grandmother    Hypertension Paternal Grandmother    Hyperlipidemia Paternal Grandmother    Heart disease Paternal Grandmother    Chronic fatigue Mother    ADD / ADHD Daughter    Anxiety disorder Daughter    Depression Daughter    Colon cancer Neg Hx    Esophageal cancer Neg Hx    Rectal cancer Neg Hx    Stomach cancer Neg Hx     Social History   Tobacco Use   Smoking status: Former    Current packs/day: 0.00    Types: Cigarettes    Quit date: 11/14/2001    Years since quitting: 21.6   Smokeless tobacco: Never  Vaping Use   Vaping status: Never Used  Substance Use Topics   Alcohol use: No    Alcohol/week: 0.0 standard drinks of alcohol    Comment: In Recovery  since 2006   Drug use: No    Comment: sobriety from benzos 2008    ROS   Objective:   Vitals: BP 130/65 (BP Location: Right Arm)   Pulse 62   Temp 98 F (36.7 C) (Oral)   Resp 16   SpO2 98%   Physical Exam Constitutional:      General: She is not in acute distress.    Appearance: Normal appearance. She is well-developed. She is not ill-appearing, toxic-appearing or diaphoretic.  HENT:     Head: Normocephalic and atraumatic.     Nose: Nose normal.     Mouth/Throat:     Mouth: Mucous membranes are moist.  Eyes:     General: No scleral icterus.       Right eye: No discharge.        Left eye: No discharge.     Extraocular Movements: Extraocular movements intact.  Cardiovascular:     Rate and Rhythm: Normal rate.  Pulmonary:     Effort: Pulmonary effort is normal.  Skin:    General: Skin is warm and dry.       Neurological:     General: No focal deficit present.     Mental Status: She is alert and oriented to person, place, and time.  Psychiatric:        Mood and Affect: Mood normal.        Behavior: Behavior normal.     Assessment and Plan :   PDMP not reviewed this encounter.  1. Itching   2. Irritant  dermatitis   3. Tick bite, initial encounter  I provided patient with antibiotic prophylaxis using doxycycline.  Recommend triamcinolone steroid cream for local relief of her irritant dermatitis. Counseled patient on potential for adverse effects with medications prescribed/recommended today, ER and return-to-clinic precautions discussed, patient verbalized understanding.     Wallis Bamberg, [place] 07/19/23 [id number]

## 2023-07-19 NOTE — ED Triage Notes (Signed)
Pt states she removed tick from LLE last night-feels it was there since 9/1-NAD-steady gait

## 2023-11-27 ENCOUNTER — Encounter: Payer: Self-pay | Admitting: Adult Health

## 2023-11-27 ENCOUNTER — Ambulatory Visit: Payer: 59 | Admitting: Adult Health

## 2023-11-27 DIAGNOSIS — F4310 Post-traumatic stress disorder, unspecified: Secondary | ICD-10-CM

## 2023-11-27 DIAGNOSIS — F411 Generalized anxiety disorder: Secondary | ICD-10-CM

## 2023-11-27 DIAGNOSIS — F3181 Bipolar II disorder: Secondary | ICD-10-CM

## 2023-11-27 MED ORDER — MIRTAZAPINE 45 MG PO TABS: 45.0000 mg | ORAL_TABLET | Freq: Every day | ORAL | 1 refills | Status: DC

## 2023-11-27 MED ORDER — LAMOTRIGINE 200 MG PO TABS: 300.0000 mg | ORAL_TABLET | Freq: Every day | ORAL | 1 refills | Status: DC

## 2023-11-27 MED ORDER — ZIPRASIDONE HCL 80 MG PO CAPS: ORAL_CAPSULE | ORAL | 1 refills | Status: DC

## 2023-11-27 NOTE — Progress Notes (Addendum)
 Nancy Bradshaw [id number] [birth year] 58 y.o.  Subjective:   Patient ID:  Nancy Bradshaw is a 58 y.o. (DOB 20-Oct-1966) female.  Chief Complaint: No chief complaint on file.   HPI Nancy Bradshaw presents to the office today for follow-up of Bipolar 2 disorder, GAD, and PTSD.  Describes mood today as ok. Pleasant. Denies tearfulness. Mood symptoms - denies depression and irritability. Reports situational anxiety. Denies panic attacks. Reports some worry. Denies rumination and over thinking - occasional intrusive thoughts. Feels like mood is consistent. Stating I feel like I'm doing alright. Feels like current medications work well. Stable interest and motivation. Taking medications as prescribed.  Energy levels stable. Active, has a regular exercise routine. Working with a merchant navy officer. Enjoys some usual interests and activities. Writing. Lives with her mother - 21. Has a child college. Spending time with family. Appetite adequate. Weight loss - intentional. Reports sleep has improved. Averages 6 to 8 hours.  Focus and concentration stable. Completing tasks. Managing aspects of household. 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 Denies SI or HI.  Denies AH or VH. Denies self harm. Denies substance use. Recovery for the past 16 years.  Previous medication trials: Trazadone  PHQ2-9    Flowsheet Row Office Visit from 03/06/2020 in Primary Care at [facility] Visit from 11/01/2019 in Primary Care at [facility] Visit from 07/31/2019 in Primary Care at [facility] Visit from 05/01/2019 in Primary Care at [facility] Visit from 02/04/2019 in Primary Care at [facility] Total Score 0 0 0 0 0      Flowsheet Row ED from 04/02/2023 in [facility] at [facility] RISK CATEGORY No Risk        Review of Systems:  Review of Systems  Medications: I have reviewed the patient's current medications.  Current  Outpatient Medications  Medication Sig Dispense Refill   lamoTRIgine  (LAMICTAL ) 200 MG tablet Take 1.5 tablets (300 mg total) by mouth daily. 135 tablet 1   lisinopril  (ZESTRIL ) 2.5 MG tablet TAKE 1 TABLET (2.5 MG TOTAL) BY MOUTH DAILY. DO NOT TAKE IF BLOOD PRESSURE IS LESS THAN 130/80. 90 tablet 0   LYSINE PO Take 1 tablet by mouth daily.     mirtazapine  (REMERON ) 45 MG tablet Take 1 tablet (45 mg total) by mouth at bedtime. 90 tablet 1   Multiple Vitamin (MULTIVITAMIN) tablet Take 1 tablet by mouth daily.     Omega-3 Fatty Acids (OMEGA 3 PO) Take by mouth.     Red Yeast Rice Extract (RED YEAST RICE PO) Take 1 tablet by mouth daily.     triamcinolone  cream (KENALOG ) 0.1 % Apply 1 Application topically 2 (two) times daily. 30 g 0   ziprasidone  (GEODON ) 80 MG capsule TAKE 1 CAPSULE BY MOUTH EVERY DAY 90 capsule 1   No current facility-administered medications for this visit.    Medication Side Effects: None  Allergies:  Allergies  Allergen Reactions   Azithromycin Nausea And Vomiting   Codeine Nausea And Vomiting   Food [Alpha-Gal] Nausea And Vomiting    Avoids all Red meat   Other     Red Meat Pt prefers not to have pain medication if not necessary- recovering alcoholic.    Pseudoephedrine Other (See Comments)   Sudafed [Pseudoephedrine Hcl]     Creepy crawly skin--if taken consistently   Zoloft [Sertraline Hcl]     Due to bipolar disorder-makes her manic.    Penicillins Rash  Has patient had a PCN reaction causing immediate rash, facial/tongue/throat swelling, SOB or lightheadedness with hypotension: No Has patient had a PCN reaction causing severe rash involving mucus membranes or skin necrosis: No Has patient had a PCN reaction that required hospitalization: No Has patient had a PCN reaction occurring within the last 10 years: No If all of the above answers are NO, then may proceed with Cephalosporin use.    Sulfa Antibiotics Rash    Past Medical History:   Diagnosis Date   Alcohol abuse    Allergy    Anxiety Dx 2006   Benign positional vertigo    Bipolar 2 disorder [facility]) Dx 2008   [facility] 2011-2017   Elevated cholesterol    Heart murmur    Dx at age of 62   Hx of dysmenorrhea 07/20/2012   Seasonale prescribed for severe dysmenorrhea and heavy bleeding with clotting; on OCP, menses lasts 4 days with light bleeding.    Hyperlipidemia    Dx at age of 74   Hypertension Dx 2009   Post-operative nausea and vomiting    PTSD (post-traumatic stress disorder) Dx 2008   Sleep apnea    mouth piece   Substance abuse (HCC)    No alcohol since 2008    Past Medical History, Surgical history, Social history, and Family history were reviewed and updated as appropriate.   Please see review of systems for further details on the patient's review from today.   Objective:   Physical Exam:  There were no vitals taken for this visit.  Physical Exam  Lab Review:     Component Value Date/Time   NA 142 11/01/2019 0835   K 4.0 11/01/2019 0835   CL 103 11/01/2019 0835   CO2 23 11/01/2019 0835   GLUCOSE 88 11/01/2019 0835   GLUCOSE 82 08/10/2016 0858   BUN 12 11/01/2019 0835   CREATININE 0.93 11/01/2019 0835   CREATININE 0.97 08/10/2016 0858   CALCIUM  9.5 11/01/2019 0835   PROT 6.6 11/01/2019 0835   ALBUMIN 4.6 11/01/2019 0835   AST 19 11/01/2019 0835   ALT 14 11/01/2019 0835   ALKPHOS 79 11/01/2019 0835   BILITOT 0.5 11/01/2019 0835   GFRNONAA 71 11/01/2019 0835   GFRNONAA 61 10/28/2015 1124   GFRAA 82 11/01/2019 0835   GFRAA 70 10/28/2015 1124       Component Value Date/Time   WBC 6.7 01/10/2019 1713   WBC 7.7 08/10/2016 0858   RBC 4.21 01/10/2019 1713   RBC 4.51 08/10/2016 0858   HGB 13.0 01/10/2019 1713   HCT 36.9 01/10/2019 1713   PLT 227 01/10/2019 1713   MCV 88 01/10/2019 1713   MCH 30.9 01/10/2019 1713   MCH 30.8 08/10/2016 0858   MCHC 35.2 01/10/2019 1713   MCHC 34.7 08/10/2016 0858   RDW 12.6 01/10/2019  1713   LYMPHSABS 2.3 01/10/2019 1713   MONOABS 539 08/10/2016 0858   EOSABS 0.2 01/10/2019 1713   BASOSABS 0.1 01/10/2019 1713    No results found for: POCLITH, LITHIUM   No results found for: PHENYTOIN, PHENOBARB, VALPROATE, CBMZ   .res Assessment: Plan:    Treatment Plan/Recommendations:  Continue:  Lamictal  300mg  daily Geodon  80mg  daily Remeron  45mg  at hs  RTC 6 months  Discussed potential metabolic side effects associated with atypical antipsychotics, as well as potential risk for movement side effects. Advised pt to contact office if movement side effects occur.    Counseled patient regarding potential benefits, risks, and side effects of  Lamictal  to include potential risk of Stevens-Johnson syndrome. Advised patient to stop taking Lamictal  and contact office immediately if rash develops and to seek urgent medical attention if rash is severe and/or spreading quickly.   Diagnoses and all orders for this visit:  No diagnosis on Axis I     Please see After Visit Summary for patient specific instructions.  Future Appointments  Date Time Provider Department Center  11/27/2023  9:00 AM Aceyn Kathol Nattalie, NP [facility] None    No orders of the defined types were placed in this encounter.   -------------------------------

## 2023-11-27 NOTE — Addendum Note (Signed)
 Addended by: Dorothyann Gibbs on: 11/27/2023 09:06 AM   Modules accepted: Orders, Level of Service

## 2024-01-09 ENCOUNTER — Encounter: Payer: Self-pay | Admitting: Adult Health

## 2024-01-09 ENCOUNTER — Telehealth: Payer: 59 | Admitting: Adult Health

## 2024-01-09 DIAGNOSIS — F3181 Bipolar II disorder: Secondary | ICD-10-CM | POA: Diagnosis not present

## 2024-01-09 DIAGNOSIS — F4310 Post-traumatic stress disorder, unspecified: Secondary | ICD-10-CM

## 2024-01-09 DIAGNOSIS — F411 Generalized anxiety disorder: Secondary | ICD-10-CM | POA: Diagnosis not present

## 2024-01-09 MED ORDER — BUSPIRONE HCL 5 MG PO TABS
5.0000 mg | ORAL_TABLET | Freq: Three times a day (TID) | ORAL | 2 refills | Status: DC
Start: 2024-01-09 — End: 2024-02-13

## 2024-01-09 NOTE — Progress Notes (Signed)
 Nancy Bradshaw [id number] January 02, 1966 58 y.o.  Virtual Visit via Video Note  I connected with pt @ on 01/09/24 at  8:30 AM EST by a video enabled telemedicine application and verified that I am speaking with the correct person using two identifiers.   I discussed the limitations of evaluation and management by telemedicine and the availability of in person appointments. The patient expressed understanding and agreed to proceed.  I discussed the assessment and treatment plan with the patient. The patient was provided an opportunity to ask questions and all were answered. The patient agreed with the plan and demonstrated an understanding of the instructions.   The patient was advised to call back or seek an in-person evaluation if the symptoms worsen or if the condition fails to improve as anticipated.  I provided 20 minutes of non-face-to-face time during this encounter.  The patient was located at home.  The provider was located at [facility].   Dorothyann Gibbs, NP   Subjective:   Patient ID:  Nancy Bradshaw is a 58 y.o. (DOB [birth year]) female.  Chief Complaint: No chief complaint on file.   HPI Nancy Bradshaw presents for follow-up of Bipolar 2 disorder, GAD, and PTSD.  Describes mood today as "not the best". Pleasant. Denies tearfulness. Mood symptoms - denies depression - "pushing herself to get out of bed". Reports stable interest and motivation. Reports situational irritability. Reports increased anxiety with recent situational stressors. Denies panic attacks. Reports some worry with multiple unknowns.  Reports rumination and over thinking - "not racing thoughts". Feels like mood is stable. Stating "I feel like I'm struggling with anxiety". Feels like current medications work well, but is willing to consider options for the anxiety. Taking medications as prescribed.  Energy levels stable. Active, has a regular exercise routine.  Enjoys  some usual interests and activities. Writing. Lives with her mother - 35. Has a child in college. Spending time with family. Appetite adequate. Weight stable - 143 pounds. Reports sleep has improved. Averages 6 to 8 hours.  Focus and concentration stable. Completing tasks. Managing aspects of household. 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 Denies SI or HI.  Denies AH or VH. Denies self harm. Denies substance use. Recovery for the past 16 years.  Previous medication trials: Trazadone  Review of Systems:  Review of Systems  Musculoskeletal:  Negative for gait problem.  Neurological:  Negative for tremors.  Psychiatric/Behavioral:         Please refer to HPI    Medications: I have reviewed the patient's current medications.  Current Outpatient Medications  Medication Sig Dispense Refill   lamoTRIgine (LAMICTAL) 200 MG tablet Take 1.5 tablets (300 mg total) by mouth daily. 135 tablet 1   lisinopril (ZESTRIL) 2.5 MG tablet TAKE 1 TABLET (2.5 MG TOTAL) BY MOUTH DAILY. DO NOT TAKE IF BLOOD PRESSURE IS LESS THAN 130/80. 90 tablet 0   LYSINE PO Take 1 tablet by mouth daily.     mirtazapine (REMERON) 45 MG tablet Take 1 tablet (45 mg total) by mouth at bedtime. 90 tablet 1   Multiple Vitamin (MULTIVITAMIN) tablet Take 1 tablet by mouth daily.     Omega-3 Fatty Acids (OMEGA 3 PO) Take by mouth.     Red Yeast Rice Extract (RED YEAST RICE PO) Take 1 tablet by mouth daily.     triamcinolone cream (KENALOG) 0.1 % Apply 1 Application topically 2 (two) times daily. 30 g 0   ziprasidone (GEODON)  80 MG capsule TAKE 1 CAPSULE BY MOUTH EVERY DAY 90 capsule 1   No current facility-administered medications for this visit.    Medication Side Effects: None  Allergies:  Allergies  Allergen Reactions   Azithromycin Nausea And Vomiting   Codeine Nausea And Vomiting   Food [Alpha-Gal] Nausea And Vomiting    Avoids all Red meat   Other     Red Meat Pt prefers not to have  pain medication if not necessary- recovering alcoholic.    Pseudoephedrine Other (See Comments)   Sudafed [Pseudoephedrine Hcl]     "Creepy crawly skin"--if taken consistently   Zoloft [Sertraline Hcl]     Due to bipolar disorder-makes her manic.    Penicillins Rash    Has patient had a PCN reaction causing immediate rash, facial/tongue/throat swelling, SOB or lightheadedness with hypotension: No Has patient had a PCN reaction causing severe rash involving mucus membranes or skin necrosis: No Has patient had a PCN reaction that required hospitalization: No Has patient had a PCN reaction occurring within the last 10 years: No If all of the above answers are "NO", then may proceed with Cephalosporin use.    Sulfa Antibiotics Rash    Past Medical History:  Diagnosis Date   Alcohol abuse    Allergy    Anxiety Dx 2006   Benign positional vertigo    Bipolar 2 disorder [facility]) Dx 2008   [facility] 2011-2017   Elevated cholesterol    Heart murmur    Dx at age of 69   Hx of dysmenorrhea 07/20/2012   Seasonale prescribed for severe dysmenorrhea and heavy bleeding with clotting; on OCP, menses lasts 4 days with light bleeding.    Hyperlipidemia    Dx at age of 68   Hypertension Dx 2009   Post-operative nausea and vomiting    PTSD (post-traumatic stress disorder) Dx 2008   Sleep apnea    mouth piece   Substance abuse (HCC)    No alcohol since 2008    Family History  Problem Relation Age of Onset   Hypertension Father    Hyperlipidemia Father    Cancer Father 37       CLL   Anxiety disorder Father    Depression Father    Heart disease Father 53       CHF   Colon polyps Father    Hypertension Maternal Grandmother    Hypothyroidism Maternal Grandmother        and great aunts    Cancer Maternal Grandmother 66       on spinal cord, rare    Hypertension Maternal Grandfather    Bipolar disorder Paternal Grandmother    Hypertension Paternal Grandmother    Hyperlipidemia  Paternal Grandmother    Heart disease Paternal Grandmother    Chronic fatigue Mother    ADD / ADHD Daughter    Anxiety disorder Daughter    Depression Daughter    Colon cancer Neg Hx    Esophageal cancer Neg Hx    Rectal cancer Neg Hx    Stomach cancer Neg Hx     Social History   Socioeconomic History   Marital status: Divorced    Spouse name: Not on file   Number of children: 1    Years of education: grad schoo   Highest education level: Not on file  Occupational History   Not on file  Tobacco Use   Smoking status: Former    Current packs/day: 0.00    Types:  Cigarettes    Quit date: 11/14/2001    Years since quitting: 22.1   Smokeless tobacco: Never  Vaping Use   Vaping status: Never Used  Substance and Sexual Activity   Alcohol use: No    Alcohol/week: 0.0 standard drinks of alcohol    Comment: In Recovery  since 2006   Drug use: No    Comment: sobriety from benzos 2008   Sexual activity: Not Currently    Birth control/protection: Surgical  Other Topics Concern   Not on file  Social History Narrative   Social Hx:   Current living situation: lives in [place] with mother and daughter   Born and Raised in [place] by both parents. 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   Kids: 1 daughter  (15yo)   Legal issues: denies   recovery since 2006.   Alcoholism age [age]; binge drinking; benzo abuse stopped in 2008. Pt goes to AA 3x/week      Drugs: none; previous marijuna use about 5 times in her life time      Tobacco: quit smoking in 2002; smoked x 25 years      Exercise: no formal exercise in 2018.     Social Drivers of Corporate investment banker Strain: Not on file  Food  Insecurity: Not on file  Transportation Needs: Not on file  Physical Activity: Not on file  Stress: Not on file  Social Connections: Not on file  Intimate Partner Violence: Not on file    Past Medical History, Surgical history, Social history, and Family history were reviewed and updated as appropriate.   Please see review of systems for further details on the patient's review from today.   Objective:   Physical Exam:  There were no vitals taken for this visit.  Physical Exam Constitutional:      General: She is not in acute distress. Musculoskeletal:        General: No deformity.  Neurological:     Mental Status: She is alert and oriented to person, place, and time.     Coordination: Coordination normal.  Psychiatric:        Attention and Perception: Attention and perception normal. She does not perceive auditory or visual hallucinations.        Mood and Affect: Affect is not labile, blunt, angry or inappropriate.        Speech: Speech normal.        Behavior: Behavior normal.        Thought Content: Thought content normal. Thought content is not paranoid or delusional. Thought content does not include homicidal or suicidal ideation. Thought content does not include homicidal or suicidal plan.        Cognition and Memory: Cognition and memory normal.        Judgment: Judgment normal.     Comments: Insight intact     Lab Review:     Component Value Date/Time   NA 142 11/01/2019 0835   K 4.0 11/01/2019 0835   CL 103 11/01/2019 0835   CO2 23 11/01/2019 0835   GLUCOSE 88 11/01/2019 0835   GLUCOSE 82 08/10/2016 0858   BUN 12 11/01/2019 0835  CREATININE 0.93 11/01/2019 0835   CREATININE 0.97 08/10/2016 0858   CALCIUM 9.5 11/01/2019 0835   PROT 6.6 11/01/2019 0835   ALBUMIN 4.6 11/01/2019 0835   AST 19 11/01/2019 0835   ALT 14 11/01/2019 0835   ALKPHOS 79 11/01/2019 0835   BILITOT 0.5 11/01/2019 0835   GFRNONAA 71 11/01/2019 0835   GFRNONAA 61 10/28/2015 1124    GFRAA 82 11/01/2019 0835   GFRAA 70 10/28/2015 1124       Component Value Date/Time   WBC 6.7 01/10/2019 1713   WBC 7.7 08/10/2016 0858   RBC 4.21 01/10/2019 1713   RBC 4.51 08/10/2016 0858   HGB 13.0 01/10/2019 1713   HCT 36.9 01/10/2019 1713   PLT 227 01/10/2019 1713   MCV 88 01/10/2019 1713   MCH 30.9 01/10/2019 1713   MCH 30.8 08/10/2016 0858   MCHC 35.2 01/10/2019 1713   MCHC 34.7 08/10/2016 0858   RDW 12.6 01/10/2019 1713   LYMPHSABS 2.3 01/10/2019 1713   MONOABS 539 08/10/2016 0858   EOSABS 0.2 01/10/2019 1713   BASOSABS 0.1 01/10/2019 1713    No results found for: "POCLITH", "LITHIUM"   No results found for: "PHENYTOIN", "PHENOBARB", "VALPROATE", "CBMZ"   .res Assessment: Plan:    Treatment Plan/Recommendations:  Continue:  Lamictal 300mg  daily Geodon 80mg  daily Remeron 45mg  at hs  Add Buspar 5mg  TID for anxiety  RTC 6 months  20 minutes spent dedicated to the care of this patient on the date of this encounter to include pre-visit review of records, ordering of medication, post visit documentation, and face-to-face time with the patient discussing Bipolar 2 disorder, GAD, and PTSD. Discussed continuing current medication regimen.  Discussed potential metabolic side effects associated with atypical antipsychotics, as well as potential risk for movement side effects. Advised pt to contact office if movement side effects occur.    Counseled patient regarding potential benefits, risks, and side effects of Lamictal to include potential risk of Stevens-Johnson syndrome. Advised patient to stop taking Lamictal and contact office immediately if rash develops and to seek urgent medical attention if rash is severe and/or spreading quickly.    There are no diagnoses linked to this encounter.   Please see After Visit Summary for patient specific instructions.  Future Appointments  Date Time Provider Department Center  01/09/2024  8:30 AM Garfield Coiner, Thereasa Solo,  NP [facility] None  05/29/2024  8:00 AM Shavar Gorka, Thereasa Solo, NP [facility] None    No orders of the defined types were placed in this encounter.     -------------------------------

## 2024-01-12 ENCOUNTER — Telehealth: Payer: Self-pay | Admitting: Adult Health

## 2024-01-12 NOTE — Telephone Encounter (Signed)
 Please see message and advise

## 2024-01-12 NOTE — Telephone Encounter (Signed)
 Pt called at 4:45p stating that she is taking Buspar.  She forgot to tell Almira Coaster she is also taking tumeric.  She said she googled and saw that taking tumeric with Buspar may make the Buspar not as effective.  She wants to know Gina's thought about it because she want to continue to take the tumeric as she does see benefits from it.   Next appt 7/16

## 2024-01-15 NOTE — Telephone Encounter (Signed)
 Pt notified to call pharmacy and have them run for possible interactions.

## 2024-02-04 ENCOUNTER — Ambulatory Visit: Payer: Self-pay

## 2024-02-13 ENCOUNTER — Telehealth (INDEPENDENT_AMBULATORY_CARE_PROVIDER_SITE_OTHER): Admitting: Adult Health

## 2024-02-13 ENCOUNTER — Encounter: Payer: Self-pay | Admitting: Adult Health

## 2024-02-13 DIAGNOSIS — F3181 Bipolar II disorder: Secondary | ICD-10-CM

## 2024-02-13 DIAGNOSIS — F4310 Post-traumatic stress disorder, unspecified: Secondary | ICD-10-CM | POA: Diagnosis not present

## 2024-02-13 DIAGNOSIS — F411 Generalized anxiety disorder: Secondary | ICD-10-CM | POA: Diagnosis not present

## 2024-02-13 MED ORDER — BUSPIRONE HCL 10 MG PO TABS: 10.0000 mg | ORAL_TABLET | Freq: Three times a day (TID) | ORAL | 1 refills | Status: DC

## 2024-02-13 NOTE — Progress Notes (Signed)
 Nancy Bradshaw [id number] [birth year] 58 y.o.  Virtual Visit via Video Note  I connected with pt @ on 02/13/24 at  5:00 PM EDT by a video enabled telemedicine application and verified that I am speaking with the correct person using two identifiers.   I discussed the limitations of evaluation and management by telemedicine and the availability of in person appointments. The patient expressed understanding and agreed to proceed.  I discussed the assessment and treatment plan with the patient. The patient was provided an opportunity to ask questions and all were answered. The patient agreed with the plan and demonstrated an understanding of the instructions.   The patient was advised to call back or seek an in-person evaluation if the symptoms worsen or if the condition fails to improve as anticipated.  I provided 20 minutes of non-face-to-face time during this encounter.  The patient was located at home.  The provider was located at [facility].   Dorothyann Gibbs, NP   Subjective:   Patient ID:  Nancy Bradshaw is a 58 y.o. (DOB 04-23-1966) female.  Chief Complaint: No chief complaint on file.   HPI Leni Pankonin Kocsis presents for follow-up of Bipolar 2 disorder, GAD, and PTSD.  Describes mood today as "better overall". Pleasant. Denies tearfulness. Mood symptoms - reports decreased depression. Reports improved interest and motivation. Reports increased anxiety with recent situational stressors in work setting. Denies panic attacks. Denies irritability. Reports some worry with multiple unknowns. Reports rumination and over thinking - "situational". Feels like mood is stable - denies mania. Stating "overall, I feel like I'm doing ok". Feels like current medications are helpful, but would like to consider an increase in Buspar to help manage the added anxiety. Taking medications as prescribed.  Energy levels stable. Active, has a regular exercise  routine.  Enjoys some usual interests and activities. Lives with her mother - 64. Has a child in college. Spending time with family. Appetite adequate. Weight stable - 143 pounds. Reports sleep has improved - recently taking an hour or more to get to sleep. Averages 6 to 8 hours.  Focus and concentration stable. Completing tasks. Managing aspects of household. 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 Denies SI or HI.  Denies AH or VH. Denies self harm. Denies substance use. In recovery for the past 16 years - has a sponsor.  Previous medication trials: Trazadone  Review of Systems:  Review of Systems  Musculoskeletal:  Negative for gait problem.  Neurological:  Negative for tremors.  Psychiatric/Behavioral:         Please refer to HPI    Medications: I have reviewed the patient's current medications.  Current Outpatient Medications  Medication Sig Dispense Refill   busPIRone (BUSPAR) 5 MG tablet Take 1 tablet (5 mg total) by mouth 3 (three) times daily. 90 tablet 2   lamoTRIgine (LAMICTAL) 200 MG tablet Take 1.5 tablets (300 mg total) by mouth daily. 135 tablet 1   lisinopril (ZESTRIL) 2.5 MG tablet TAKE 1 TABLET (2.5 MG TOTAL) BY MOUTH DAILY. DO NOT TAKE IF BLOOD PRESSURE IS LESS THAN 130/80. 90 tablet 0   LYSINE PO Take 1 tablet by mouth daily.     mirtazapine (REMERON) 45 MG tablet Take 1 tablet (45 mg total) by mouth at bedtime. 90 tablet 1   Multiple Vitamin (MULTIVITAMIN) tablet Take 1 tablet by mouth daily.     Omega-3 Fatty Acids (OMEGA 3 PO) Take by mouth.     Red  Yeast Rice Extract (RED YEAST RICE PO) Take 1 tablet by mouth daily.     triamcinolone cream (KENALOG) 0.1 % Apply 1 Application topically 2 (two) times daily. 30 g 0   ziprasidone (GEODON) 80 MG capsule TAKE 1 CAPSULE BY MOUTH EVERY DAY 90 capsule 1   No current facility-administered medications for this visit.    Medication Side Effects: None  Allergies:  Allergies  Allergen  Reactions   Azithromycin Nausea And Vomiting   Codeine Nausea And Vomiting   Food [Alpha-Gal] Nausea And Vomiting    Avoids all Red meat   Other     Red Meat Pt prefers not to have pain medication if not necessary- recovering alcoholic.    Pseudoephedrine Other (See Comments)   Sudafed [Pseudoephedrine Hcl]     "Creepy crawly skin"--if taken consistently   Zoloft [Sertraline Hcl]     Due to bipolar disorder-makes her manic.    Penicillins Rash    Has patient had a PCN reaction causing immediate rash, facial/tongue/throat swelling, SOB or lightheadedness with hypotension: No Has patient had a PCN reaction causing severe rash involving mucus membranes or skin necrosis: No Has patient had a PCN reaction that required hospitalization: No Has patient had a PCN reaction occurring within the last 10 years: No If all of the above answers are "NO", then may proceed with Cephalosporin use.    Sulfa Antibiotics Rash    Past Medical History:  Diagnosis Date   Alcohol abuse    Allergy    Anxiety Dx 2006   Benign positional vertigo    Bipolar 2 disorder [facility]) Dx 2008   [facility] 2011-2017   Elevated cholesterol    Heart murmur    Dx at age of 33   Hx of dysmenorrhea 07/20/2012   Seasonale prescribed for severe dysmenorrhea and heavy bleeding with clotting; on OCP, menses lasts 4 days with light bleeding.    Hyperlipidemia    Dx at age of 89   Hypertension Dx 2009   Post-operative nausea and vomiting    PTSD (post-traumatic stress disorder) Dx 2008   Sleep apnea    mouth piece   Substance abuse (HCC)    No alcohol since 2008    Family History  Problem Relation Age of Onset   Hypertension Father    Hyperlipidemia Father    Cancer Father 60       CLL   Anxiety disorder Father    Depression Father    Heart disease Father 30       CHF   Colon polyps Father    Hypertension Maternal Grandmother    Hypothyroidism Maternal Grandmother        and great aunts    Cancer  Maternal Grandmother 11       on spinal cord, rare    Hypertension Maternal Grandfather    Bipolar disorder Paternal Grandmother    Hypertension Paternal Grandmother    Hyperlipidemia Paternal Grandmother    Heart disease Paternal Grandmother    Chronic fatigue Mother    ADD / ADHD Daughter    Anxiety disorder Daughter    Depression Daughter    Colon cancer Neg Hx    Esophageal cancer Neg Hx    Rectal cancer Neg Hx    Stomach cancer Neg Hx     Social History   Socioeconomic History   Marital status: Divorced    Spouse name: Not on file   Number of children: 1    Years of  education: grad schoo   Highest education level: Not on file  Occupational History   Not on file  Tobacco Use   Smoking status: Former    Current packs/day: 0.00    Types: Cigarettes    Quit date: 11/14/2001    Years since quitting: 22.2   Smokeless tobacco: Never  Vaping Use   Vaping status: Never Used  Substance and Sexual Activity   Alcohol use: No    Alcohol/week: 0.0 standard drinks of alcohol    Comment: In Recovery  since 2006   Drug use: No    Comment: sobriety from benzos 2008   Sexual activity: Not Currently    Birth control/protection: Surgical  Other Topics Concern   Not on file  Social History Narrative   Social Hx:   Current living situation: lives in [place] with mother and daughter   Born and Raised in [place] by both parents. 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   Kids: 1 daughter  (15yo)   Legal issues: denies   recovery since 2006.   Alcoholism age [age]; binge drinking; benzo abuse stopped in 2008. Pt goes to AA 3x/week      Drugs: none; previous marijuna use about 5 times in her life  time      Tobacco: quit smoking in 2002; smoked x 25 years      Exercise: no formal exercise in 2018.     Social Drivers of Corporate investment banker Strain: Not on file  Food Insecurity: Not on file  Transportation Needs: Not on file  Physical Activity: Not on file  Stress: Not on file  Social Connections: Not on file  Intimate Partner Violence: Not on file    Past Medical History, Surgical history, Social history, and Family history were reviewed and updated as appropriate.   Please see review of systems for further details on the patient's review from today.   Objective:   Physical Exam:  There were no vitals taken for this visit.  Physical Exam Constitutional:      General: She is not in acute distress. Musculoskeletal:        General: No deformity.  Neurological:     Mental Status: She is alert and oriented to person, place, and time.     Coordination: Coordination normal.  Psychiatric:        Attention and Perception: Attention and perception normal. She does not perceive auditory or visual hallucinations.        Mood and Affect: Affect is not labile, blunt, angry or inappropriate.        Speech: Speech normal.        Behavior: Behavior normal.        Thought Content: Thought content normal. Thought content is not paranoid or delusional. Thought content does not include homicidal or suicidal ideation. Thought content does not include homicidal or suicidal plan.        Cognition and Memory: Cognition and memory normal.        Judgment: Judgment normal.     Comments: Insight intact     Lab Review:     Component Value Date/Time   NA 142 11/01/2019 0835  K 4.0 11/01/2019 0835   CL 103 11/01/2019 0835   CO2 23 11/01/2019 0835   GLUCOSE 88 11/01/2019 0835   GLUCOSE 82 08/10/2016 0858   BUN 12 11/01/2019 0835   CREATININE 0.93 11/01/2019 0835   CREATININE 0.97 08/10/2016 0858   CALCIUM 9.5 11/01/2019 0835   PROT 6.6 11/01/2019 0835   ALBUMIN 4.6 11/01/2019  0835   AST 19 11/01/2019 0835   ALT 14 11/01/2019 0835   ALKPHOS 79 11/01/2019 0835   BILITOT 0.5 11/01/2019 0835   GFRNONAA 71 11/01/2019 0835   GFRNONAA 61 10/28/2015 1124   GFRAA 82 11/01/2019 0835   GFRAA 70 10/28/2015 1124       Component Value Date/Time   WBC 6.7 01/10/2019 1713   WBC 7.7 08/10/2016 0858   RBC 4.21 01/10/2019 1713   RBC 4.51 08/10/2016 0858   HGB 13.0 01/10/2019 1713   HCT 36.9 01/10/2019 1713   PLT 227 01/10/2019 1713   MCV 88 01/10/2019 1713   MCH 30.9 01/10/2019 1713   MCH 30.8 08/10/2016 0858   MCHC 35.2 01/10/2019 1713   MCHC 34.7 08/10/2016 0858   RDW 12.6 01/10/2019 1713   LYMPHSABS 2.3 01/10/2019 1713   MONOABS 539 08/10/2016 0858   EOSABS 0.2 01/10/2019 1713   BASOSABS 0.1 01/10/2019 1713    No results found for: "POCLITH", "LITHIUM"   No results found for: "PHENYTOIN", "PHENOBARB", "VALPROATE", "CBMZ"   .res Assessment: Plan:    Treatment Plan/Recommendations:  Continue:  Lamictal 300mg  daily Geodon 80mg  daily Remeron 45mg  at hs  Increase Buspar 5mg  to 10 TID for anxiety  RTC 6 months  20 minutes spent dedicated to the care of this patient on the date of this encounter to include pre-visit review of records, ordering of medication, post visit documentation, and face-to-face time with the patient discussing Bipolar 2 disorder, GAD and PTSD. Discussed continuing current medication regimen.  Discussed potential metabolic side effects associated with atypical antipsychotics, as well as potential risk for movement side effects. Advised pt to contact office if movement side effects occur.    Counseled patient regarding potential benefits, risks, and side effects of Lamictal to include potential risk of Stevens-Johnson syndrome. Advised patient to stop taking Lamictal and contact office immediately if rash develops and to seek urgent medical attention if rash is severe and/or spreading quickly.    There are no diagnoses linked to  this encounter.   Please see After Visit Summary for patient specific instructions.  Future Appointments  Date Time Provider Department Center  02/13/2024  5:00 PM Zymiere Trostle, Thereasa Solo, NP CP-CP None  05/29/2024  8:00 AM Quang Thorpe, Thereasa Solo, NP [facility] None    No orders of the defined types were placed in this encounter.     -------------------------------

## 2024-02-14 ENCOUNTER — Telehealth: Payer: Self-pay | Admitting: Adult Health

## 2024-02-14 DIAGNOSIS — F411 Generalized anxiety disorder: Secondary | ICD-10-CM

## 2024-02-14 MED ORDER — BUSPIRONE HCL 10 MG PO TABS: 10.0000 mg | ORAL_TABLET | Freq: Three times a day (TID) | ORAL | 1 refills | Status: DC

## 2024-02-14 NOTE — Telephone Encounter (Signed)
 Buspar sent to [facility]

## 2024-02-14 NOTE — Telephone Encounter (Signed)
 Next appt is 05/29/24. Nancy Bradshaw called stating her Buspar 10 mg was sent to [facility], [place], [name]. It needs to be sent to:  [facility], [address] [place], [place], [name] [address].   [phone]

## 2024-02-22 ENCOUNTER — Other Ambulatory Visit: Payer: Self-pay | Admitting: Family Medicine

## 2024-02-22 DIAGNOSIS — I10 Essential (primary) hypertension: Secondary | ICD-10-CM

## 2024-03-06 ENCOUNTER — Ambulatory Visit
Admission: RE | Admit: 2024-03-06 | Discharge: 2024-03-06 | Disposition: A | Discharge status: Home / Self Care | Source: Ambulatory Visit | Attending: Family Medicine | Admitting: Family Medicine

## 2024-03-06 DIAGNOSIS — I10 Essential (primary) hypertension: Secondary | ICD-10-CM

## 2024-04-01 ENCOUNTER — Other Ambulatory Visit: Payer: Self-pay | Admitting: Internal Medicine

## 2024-05-29 ENCOUNTER — Telehealth: Payer: Self-pay | Admitting: Adult Health

## 2024-05-29 ENCOUNTER — Ambulatory Visit: Payer: 59 | Admitting: Adult Health

## 2024-05-29 ENCOUNTER — Encounter: Payer: Self-pay | Admitting: Adult Health

## 2024-05-29 DIAGNOSIS — F4310 Post-traumatic stress disorder, unspecified: Secondary | ICD-10-CM | POA: Diagnosis not present

## 2024-05-29 DIAGNOSIS — F411 Generalized anxiety disorder: Secondary | ICD-10-CM

## 2024-05-29 DIAGNOSIS — F3181 Bipolar II disorder: Secondary | ICD-10-CM

## 2024-05-29 MED ORDER — MIRTAZAPINE 45 MG PO TABS: 45.0000 mg | ORAL_TABLET | Freq: Every day | ORAL | 1 refills | Status: DC

## 2024-05-29 MED ORDER — LAMOTRIGINE 200 MG PO TABS: 300.0000 mg | ORAL_TABLET | Freq: Every day | ORAL | 1 refills | Status: DC

## 2024-05-29 MED ORDER — ZIPRASIDONE HCL 80 MG PO CAPS: ORAL_CAPSULE | ORAL | 1 refills | Status: DC

## 2024-05-29 NOTE — Telephone Encounter (Signed)
 Next visit is 11/26/24. Macil was seen this morning and called to say that [facility] [address] but they needed to be called to [employer] at [facility] Phone number is [phone]. Medications called in are:  Lamictal  200 mg, Lisinopril  2.5 mg, Remeron  45 mg and Ziprasidone  80 mg.   [facility] [place], [name] - [address]   Phone: [phone]  Fax: [phone]

## 2024-05-29 NOTE — Progress Notes (Signed)
 Nancy Bradshaw [id number] 1965-12-07 58 y.o.  Subjective:   Patient ID:  Nancy Bradshaw is a 58 y.o. (DOB 1966/07/28) female.  Chief Complaint: No chief complaint on file.   HPI Nancy Bradshaw presents to the office today for follow-up of Bipolar 2 disorder, GAD and PTSD.  Describes mood today as better overall. Pleasant. Denies tearfulness. Mood symptoms - denies depression. Reports stable interest and motivation. Reports anxiety, but better - situational stressors. Denies panic attacks. Denies irritability. Reports decreased worry, rumination and over thinking. Denies mania - manic symptoms. Reports mood is stable. Stating I feel like I'm doing ok, for the most part. Feels like current medications are helpful. Taking medications as prescribed.  Energy levels stable. Active, has a regular exercise routine.  Enjoys some usual interests and activities. Lives with her mother - 42. Has a child in college. Spending time with family. Appetite adequate. Weight stable - 145 pounds. Reports sleep has improved. Averages 6.5 to 7.5 hours.  Focus and concentration stable. Completing tasks. Managing aspects of household. 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 Denies SI or HI.  Denies AH or VH. Denies self harm. Denies substance use - in recovery for the past 16 years.  Previous medication trials: Trazadone   PHQ2-9    Flowsheet Row Office Visit from 03/06/2020 in Primary Care at [facility] Visit from 11/01/2019 in Primary Care at [facility] Visit from 07/31/2019 in Primary Care at [facility] Visit from 05/01/2019 in Primary Care at [facility] Visit from 02/04/2019 in Primary Care at [facility] Total Score 0 0 0 0 0   Flowsheet Row UC from 04/02/2023 in [facility] at [facility] RISK CATEGORY No Risk     Review of Systems:  Review of Systems  Musculoskeletal:  Negative for gait problem.  Neurological:   Negative for tremors.  Psychiatric/Behavioral:         Please refer to HPI    Medications: I have reviewed the patient's current medications.  Current Outpatient Medications  Medication Sig Dispense Refill   busPIRone  (BUSPAR ) 10 MG tablet Take 1 tablet (10 mg total) by mouth 3 (three) times daily. 270 tablet 1   lamoTRIgine  (LAMICTAL ) 200 MG tablet Take 1.5 tablets (300 mg total) by mouth daily. 135 tablet 1   lisinopril  (ZESTRIL ) 2.5 MG tablet TAKE 1 TABLET (2.5 MG TOTAL) BY MOUTH DAILY. DO NOT TAKE IF BLOOD PRESSURE IS LESS THAN 130/80. 90 tablet 0   LYSINE PO Take 1 tablet by mouth daily.     mirtazapine  (REMERON ) 45 MG tablet Take 1 tablet (45 mg total) by mouth at bedtime. 90 tablet 1   Multiple Vitamin (MULTIVITAMIN) tablet Take 1 tablet by mouth daily.     Omega-3 Fatty Acids (OMEGA 3 PO) Take by mouth.     Red Yeast Rice Extract (RED YEAST RICE PO) Take 1 tablet by mouth daily.     triamcinolone  cream (KENALOG ) 0.1 % Apply 1 Application topically 2 (two) times daily. 30 g 0   ziprasidone  (GEODON ) 80 MG capsule TAKE 1 CAPSULE BY MOUTH EVERY DAY 90 capsule 1   No current facility-administered medications for this visit.    Medication Side Effects: None  Allergies:  Allergies  Allergen Reactions   Azithromycin Nausea And Vomiting   Codeine Nausea And Vomiting   Food [Alpha-Gal] Nausea And Vomiting    Avoids all Red meat   Other     Red Meat  Pt prefers not to have pain medication if not necessary- recovering alcoholic.    Pseudoephedrine Other (See Comments)   Sudafed [Pseudoephedrine Hcl]     Creepy crawly skin--if taken consistently   Zoloft [Sertraline Hcl]     Due to bipolar disorder-makes her manic.    Penicillins Rash    Has patient had a PCN reaction causing immediate rash, facial/tongue/throat swelling, SOB or lightheadedness with hypotension: No Has patient had a PCN reaction causing severe rash involving mucus membranes or skin necrosis: No Has patient had  a PCN reaction that required hospitalization: No Has patient had a PCN reaction occurring within the last 10 years: No If all of the above answers are NO, then may proceed with Cephalosporin use.    Sulfa Antibiotics Rash    Past Medical History:  Diagnosis Date   Alcohol abuse    Allergy    Anxiety Dx 2006   Benign positional vertigo    Bipolar 2 disorder [facility]) Dx 2008   [facility] 2011-2017   Elevated cholesterol    Heart murmur    Dx at age of 21   Hx of dysmenorrhea 07/20/2012   Seasonale prescribed for severe dysmenorrhea and heavy bleeding with clotting; on OCP, menses lasts 4 days with light bleeding.    Hyperlipidemia    Dx at age of 67   Hypertension Dx 2009   Post-operative nausea and vomiting    PTSD (post-traumatic stress disorder) Dx 2008   Sleep apnea    mouth piece   Substance abuse (HCC)    No alcohol since 2008    Past Medical History, Surgical history, Social history, and Family history were reviewed and updated as appropriate.   Please see review of systems for further details on the patient's review from today.   Objective:   Physical Exam:  There were no vitals taken for this visit.  Physical Exam Constitutional:      General: She is not in acute distress. Musculoskeletal:        General: No deformity.  Neurological:     Mental Status: She is alert and oriented to person, place, and time.     Coordination: Coordination normal.  Psychiatric:        Attention and Perception: Attention and perception normal. She does not perceive auditory or visual hallucinations.        Mood and Affect: Mood normal. Mood is not anxious or depressed. Affect is not labile, blunt, angry or inappropriate.        Speech: Speech normal.        Behavior: Behavior normal.        Thought Content: Thought content normal. Thought content is not paranoid or delusional. Thought content does not include homicidal or suicidal ideation. Thought content does not include  homicidal or suicidal plan.        Cognition and Memory: Cognition and memory normal.        Judgment: Judgment normal.     Comments: Insight intact     Lab Review:     Component Value Date/Time   NA 142 11/01/2019 0835   K 4.0 11/01/2019 0835   CL 103 11/01/2019 0835   CO2 23 11/01/2019 0835   GLUCOSE 88 11/01/2019 0835   GLUCOSE 82 08/10/2016 0858   BUN 12 11/01/2019 0835   CREATININE 0.93 11/01/2019 0835   CREATININE 0.97 08/10/2016 0858   CALCIUM  9.5 11/01/2019 0835   PROT 6.6 11/01/2019 0835   ALBUMIN 4.6 11/01/2019 9164  AST 19 11/01/2019 0835   ALT 14 11/01/2019 0835   ALKPHOS 79 11/01/2019 0835   BILITOT 0.5 11/01/2019 0835   GFRNONAA 71 11/01/2019 0835   GFRNONAA 61 10/28/2015 1124   GFRAA 82 11/01/2019 0835   GFRAA 70 10/28/2015 1124       Component Value Date/Time   WBC 6.7 01/10/2019 1713   WBC 7.7 08/10/2016 0858   RBC 4.21 01/10/2019 1713   RBC 4.51 08/10/2016 0858   HGB 13.0 01/10/2019 1713   HCT 36.9 01/10/2019 1713   PLT 227 01/10/2019 1713   MCV 88 01/10/2019 1713   MCH 30.9 01/10/2019 1713   MCH 30.8 08/10/2016 0858   MCHC 35.2 01/10/2019 1713   MCHC 34.7 08/10/2016 0858   RDW 12.6 01/10/2019 1713   LYMPHSABS 2.3 01/10/2019 1713   MONOABS 539 08/10/2016 0858   EOSABS 0.2 01/10/2019 1713   BASOSABS 0.1 01/10/2019 1713    No results found for: POCLITH, LITHIUM   No results found for: PHENYTOIN, PHENOBARB, VALPROATE, CBMZ   .res Assessment: Plan:    Treatment Plan/Recommendations:  Continue:  Lamictal  300mg  daily Geodon  80mg  daily Remeron  45mg  at hs  Buspar  10 TID for anxiety - may increase dose to 15mg  TID - will try increase and call when needing a refill.  RTC 6 months  20 minutes spent dedicated to the care of this patient on the date of this encounter to include pre-visit review of records, ordering of medication, post visit documentation, and face-to-face time with the patient discussing Bipolar 2 disorder, GAD  and PTSD. Discussed continuing current medication regimen.  Discussed potential metabolic side effects associated with atypical antipsychotics, as well as potential risk for movement side effects. Advised pt to contact office if movement side effects occur.    Counseled patient regarding potential benefits, risks, and side effects of Lamictal  to include potential risk of Stevens-Johnson syndrome. Advised patient to stop taking Lamictal  and contact office immediately if rash develops and to seek urgent medical attention if rash is severe and/or spreading quickly.    There are no diagnoses linked to this encounter.   Please see After Visit Summary for patient specific instructions.  Future Appointments  Date Time Provider Department Center  05/29/2024  8:00 AM Ajia Chadderdon Nattalie, NP CP-CP None    No orders of the defined types were placed in this encounter.   -------------------------------

## 2024-05-29 NOTE — Telephone Encounter (Signed)
 Lisinopril  not sent today, not prescribed by us . [facility].

## 2024-06-12 ENCOUNTER — Telehealth: Payer: Self-pay | Admitting: Adult Health

## 2024-06-12 MED ORDER — BUSPIRONE HCL 15 MG PO TABS: 15.0000 mg | ORAL_TABLET | Freq: Three times a day (TID) | ORAL | 1 refills | Status: DC

## 2024-06-12 NOTE — Telephone Encounter (Signed)
 Pt is trying the 15mg  of Buspar  and she likes it so she's needs a new script for the 15mg      [employer] in [facility].

## 2024-06-12 NOTE — Telephone Encounter (Signed)
 Rx for 15 mg TID sent to [facility] with note to cancel 10 mg dosing. Canceled 10 mg in Epic.

## 2024-08-23 ENCOUNTER — Other Ambulatory Visit: Payer: Self-pay | Admitting: Adult Health

## 2024-11-24 ENCOUNTER — Telehealth: Payer: Self-pay | Admitting: Adult Health

## 2024-11-24 DIAGNOSIS — F411 Generalized anxiety disorder: Secondary | ICD-10-CM

## 2024-11-24 DIAGNOSIS — F3181 Bipolar II disorder: Secondary | ICD-10-CM

## 2024-11-24 DIAGNOSIS — F4310 Post-traumatic stress disorder, unspecified: Secondary | ICD-10-CM

## 2024-11-25 MED ORDER — ZIPRASIDONE HCL 80 MG PO CAPS: ORAL_CAPSULE | ORAL | 1 refills | Status: AC

## 2024-11-25 MED ORDER — MIRTAZAPINE 45 MG PO TABS: 45.0000 mg | ORAL_TABLET | Freq: Every day | ORAL | 1 refills | Status: AC

## 2024-11-25 NOTE — Telephone Encounter (Signed)
 Patient called in for refill on Lamotrigine  200mg , Geodon  80mg  and Mirtazapine  45mg . She is out and appt was canceled due to provider being out. Ph: [phone] Appt 2/19 Pharmacy [facility] [address] [place]

## 2024-11-25 NOTE — Telephone Encounter (Signed)
 Had received LF via pharmacy interface for Lamictal . Added on Geodon  and mirtazapine  to [facility].

## 2024-11-26 ENCOUNTER — Ambulatory Visit: Admitting: Adult Health

## 2025-01-01 ENCOUNTER — Ambulatory Visit: Admitting: Adult Health
